# Patient Record
Sex: Female | Born: 1985 | Race: White | Hispanic: No | Smoking: Former smoker
Health system: Southern US, Community
[De-identification: ages and names within clinical notes are randomized; demographics above are authoritative.]

## PROBLEM LIST (undated history)

## (undated) ENCOUNTER — Inpatient Hospital Stay: Payer: Self-pay

## (undated) DIAGNOSIS — R519 Headache, unspecified: Secondary | ICD-10-CM

## (undated) DIAGNOSIS — K8020 Calculus of gallbladder without cholecystitis without obstruction: Secondary | ICD-10-CM

## (undated) DIAGNOSIS — Z3490 Encounter for supervision of normal pregnancy, unspecified, unspecified trimester: Secondary | ICD-10-CM

## (undated) DIAGNOSIS — O09299 Supervision of pregnancy with other poor reproductive or obstetric history, unspecified trimester: Secondary | ICD-10-CM

## (undated) DIAGNOSIS — F329 Major depressive disorder, single episode, unspecified: Secondary | ICD-10-CM

## (undated) DIAGNOSIS — T8859XA Other complications of anesthesia, initial encounter: Secondary | ICD-10-CM

## (undated) DIAGNOSIS — Z9889 Other specified postprocedural states: Secondary | ICD-10-CM

## (undated) DIAGNOSIS — R51 Headache: Secondary | ICD-10-CM

## (undated) DIAGNOSIS — R32 Unspecified urinary incontinence: Secondary | ICD-10-CM

## (undated) DIAGNOSIS — T7840XA Allergy, unspecified, initial encounter: Secondary | ICD-10-CM

## (undated) DIAGNOSIS — F419 Anxiety disorder, unspecified: Secondary | ICD-10-CM

## (undated) DIAGNOSIS — J45909 Unspecified asthma, uncomplicated: Secondary | ICD-10-CM

## (undated) DIAGNOSIS — O99810 Abnormal glucose complicating pregnancy: Secondary | ICD-10-CM

## (undated) DIAGNOSIS — F32A Depression, unspecified: Secondary | ICD-10-CM

## (undated) DIAGNOSIS — O3663X0 Maternal care for excessive fetal growth, third trimester, not applicable or unspecified: Secondary | ICD-10-CM

## (undated) DIAGNOSIS — R112 Nausea with vomiting, unspecified: Secondary | ICD-10-CM

## (undated) DIAGNOSIS — K219 Gastro-esophageal reflux disease without esophagitis: Secondary | ICD-10-CM

## (undated) DIAGNOSIS — Z9289 Personal history of other medical treatment: Secondary | ICD-10-CM

## (undated) DIAGNOSIS — O24419 Gestational diabetes mellitus in pregnancy, unspecified control: Secondary | ICD-10-CM

## (undated) DIAGNOSIS — D649 Anemia, unspecified: Secondary | ICD-10-CM

## (undated) DIAGNOSIS — T4145XA Adverse effect of unspecified anesthetic, initial encounter: Secondary | ICD-10-CM

## (undated) HISTORY — DX: Personal history of other medical treatment: Z92.89

## (undated) HISTORY — DX: Headache, unspecified: R51.9

## (undated) HISTORY — PX: MOUTH SURGERY: SHX715

## (undated) HISTORY — PX: FOREIGN BODY REMOVAL: SHX962

## (undated) HISTORY — DX: Unspecified urinary incontinence: R32

## (undated) HISTORY — DX: Gestational diabetes mellitus in pregnancy, unspecified control: O24.419

## (undated) HISTORY — PX: TUBAL LIGATION: SHX77

## (undated) HISTORY — DX: Allergy, unspecified, initial encounter: T78.40XA

## (undated) HISTORY — DX: Abnormal glucose complicating pregnancy: O99.810

## (undated) HISTORY — DX: Maternal care for excessive fetal growth, third trimester, not applicable or unspecified: O36.63X0

## (undated) HISTORY — DX: Encounter for supervision of normal pregnancy, unspecified, unspecified trimester: Z34.90

## (undated) HISTORY — DX: Anxiety disorder, unspecified: F41.9

## (undated) HISTORY — DX: Headache: R51

---

## 2009-05-27 ENCOUNTER — Observation Stay: Payer: Self-pay | Admitting: Obstetrics & Gynecology

## 2009-11-30 ENCOUNTER — Observation Stay: Payer: Self-pay | Admitting: Unknown Physician Specialty

## 2009-12-29 ENCOUNTER — Observation Stay: Payer: Self-pay

## 2009-12-30 ENCOUNTER — Observation Stay: Payer: Self-pay

## 2009-12-31 ENCOUNTER — Observation Stay: Payer: Self-pay | Admitting: Obstetrics and Gynecology

## 2010-01-01 ENCOUNTER — Inpatient Hospital Stay: Payer: Self-pay | Admitting: Obstetrics & Gynecology

## 2010-02-26 ENCOUNTER — Emergency Department: Payer: Self-pay | Admitting: Emergency Medicine

## 2011-09-16 ENCOUNTER — Ambulatory Visit: Payer: Self-pay | Admitting: Family Medicine

## 2011-11-22 ENCOUNTER — Emergency Department: Payer: Self-pay | Admitting: Internal Medicine

## 2011-11-22 LAB — LIPASE, BLOOD: Lipase: 361 U/L (ref 73–393)

## 2011-11-22 LAB — COMPREHENSIVE METABOLIC PANEL
Alkaline Phosphatase: 39 U/L — ABNORMAL LOW (ref 50–136)
Anion Gap: 12 (ref 7–16)
BUN: 8 mg/dL (ref 7–18)
Bilirubin,Total: 0.3 mg/dL (ref 0.2–1.0)
Chloride: 105 mmol/L (ref 98–107)
Co2: 23 mmol/L (ref 21–32)
Creatinine: 0.52 mg/dL — ABNORMAL LOW (ref 0.60–1.30)
EGFR (African American): >60
EGFR (Non-African Amer.): >60
Osmolality: 277 (ref 275–301)
Potassium: 3.8 mmol/L (ref 3.5–5.1)
SGPT (ALT): 19 U/L
Sodium: 140 mmol/L (ref 136–145)
Total Protein: 7.3 g/dL (ref 6.4–8.2)

## 2011-11-22 LAB — URINALYSIS, COMPLETE
Bilirubin,UR: NEGATIVE
Glucose,UR: NEGATIVE mg/dL (ref 0–75)
Leukocyte Esterase: NEGATIVE
Nitrite: NEGATIVE
Protein: NEGATIVE
RBC,UR: 1 /HPF (ref 0–5)
Squamous Epithelial: 5

## 2011-11-22 LAB — CBC
HCT: 37.4 % (ref 35.0–47.0)
HGB: 12.7 g/dL (ref 12.0–16.0)
MCH: 29.6 pg (ref 26.0–34.0)
MCHC: 33.9 g/dL (ref 32.0–36.0)

## 2011-11-22 LAB — PREGNANCY, URINE: Pregnancy Test, Urine: POSITIVE m[IU]/mL

## 2011-11-22 LAB — WET PREP, GENITAL

## 2012-03-07 ENCOUNTER — Observation Stay: Payer: Self-pay | Admitting: Obstetrics and Gynecology

## 2012-03-16 ENCOUNTER — Observation Stay: Payer: Self-pay | Admitting: Obstetrics & Gynecology

## 2012-04-18 ENCOUNTER — Observation Stay: Payer: Self-pay | Admitting: Obstetrics and Gynecology

## 2012-04-18 LAB — URINALYSIS, COMPLETE
Bilirubin,UR: NEGATIVE
Blood: NEGATIVE
Glucose,UR: NEGATIVE mg/dL (ref 0–75)
Ketone: NEGATIVE
Nitrite: NEGATIVE
Ph: 7 (ref 4.5–8.0)
Protein: NEGATIVE
RBC,UR: NONE SEEN /HPF (ref 0–5)
Specific Gravity: 1.013 (ref 1.003–1.030)
Squamous Epithelial: 8
WBC UR: 2 /HPF (ref 0–5)

## 2012-04-19 LAB — URINE CULTURE

## 2012-04-23 ENCOUNTER — Emergency Department: Payer: Self-pay

## 2012-04-23 LAB — COMPREHENSIVE METABOLIC PANEL
Alkaline Phosphatase: 174 U/L — ABNORMAL HIGH (ref 50–136)
Anion Gap: 9 (ref 7–16)
BUN: 4 mg/dL — ABNORMAL LOW (ref 7–18)
Calcium, Total: 8.3 mg/dL — ABNORMAL LOW (ref 8.5–10.1)
Chloride: 109 mmol/L — ABNORMAL HIGH (ref 98–107)
Co2: 24 mmol/L (ref 21–32)
EGFR (African American): >60
EGFR (Non-African Amer.): >60
Potassium: 4.1 mmol/L (ref 3.5–5.1)
SGOT(AST): 37 U/L (ref 15–37)
SGPT (ALT): 12 U/L
Total Protein: 6.5 g/dL (ref 6.4–8.2)

## 2012-04-23 LAB — URINALYSIS, COMPLETE
Bacteria: NONE SEEN
Glucose,UR: NEGATIVE mg/dL (ref 0–75)
Leukocyte Esterase: NEGATIVE
Nitrite: NEGATIVE
Protein: NEGATIVE
RBC,UR: 1 /HPF (ref 0–5)
Specific Gravity: 1.017 (ref 1.003–1.030)
WBC UR: 1 /HPF (ref 0–5)

## 2012-04-23 LAB — CBC: WBC: 13.2 10*3/uL — ABNORMAL HIGH (ref 3.6–11.0)

## 2012-05-05 ENCOUNTER — Observation Stay: Payer: Self-pay

## 2012-05-05 LAB — URINALYSIS, COMPLETE
Bilirubin,UR: NEGATIVE
Blood: NEGATIVE
Glucose,UR: NEGATIVE mg/dL (ref 0–75)
Ketone: NEGATIVE
Ph: 6 (ref 4.5–8.0)
Squamous Epithelial: 35
WBC UR: 9 /HPF (ref 0–5)

## 2012-05-07 LAB — URINE CULTURE

## 2012-05-18 ENCOUNTER — Observation Stay: Payer: Self-pay

## 2012-06-02 ENCOUNTER — Inpatient Hospital Stay: Payer: Self-pay | Admitting: Obstetrics & Gynecology

## 2012-06-02 LAB — CBC WITH DIFFERENTIAL/PLATELET
Eosinophil %: 0.3 %
HCT: 31.4 % — ABNORMAL LOW (ref 35.0–47.0)
Lymphocyte #: 2.6 10*3/uL (ref 1.0–3.6)
MCV: 80 fL (ref 80–100)
Monocyte %: 5.5 %
Neutrophil #: 7.1 10*3/uL — ABNORMAL HIGH (ref 1.4–6.5)
RBC: 3.91 10*6/uL (ref 3.80–5.20)
WBC: 10.4 10*3/uL (ref 3.6–11.0)

## 2013-09-04 IMAGING — US ABDOMEN ULTRASOUND LIMITED
1 series · 17 of 25 positions shown · non-contrast
Comparison: none

REASON FOR EXAM: elevated liver enzymes  eval liver gallbladder
COMMENTS:

[Series 1: abdomen ultrasound limited · 17 of 44 slices shown]
[im 1/44]
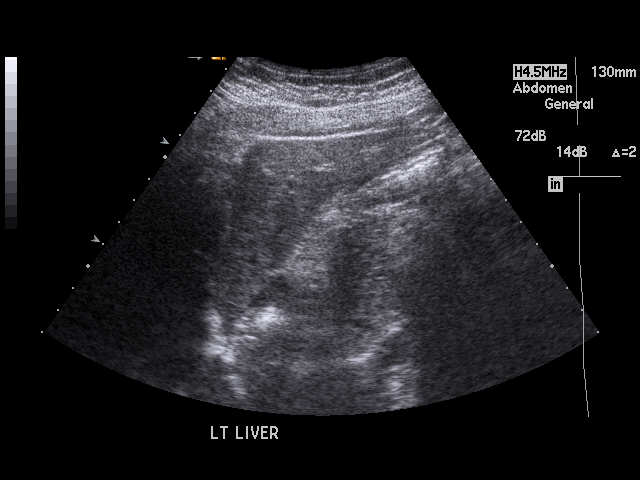
[im 4/44]
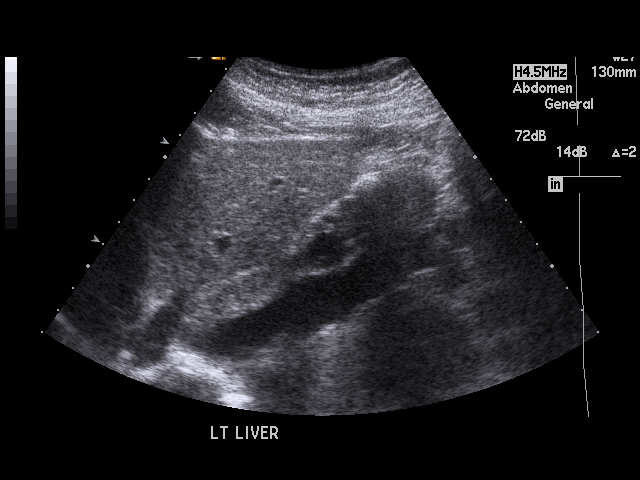
[im 6/44]
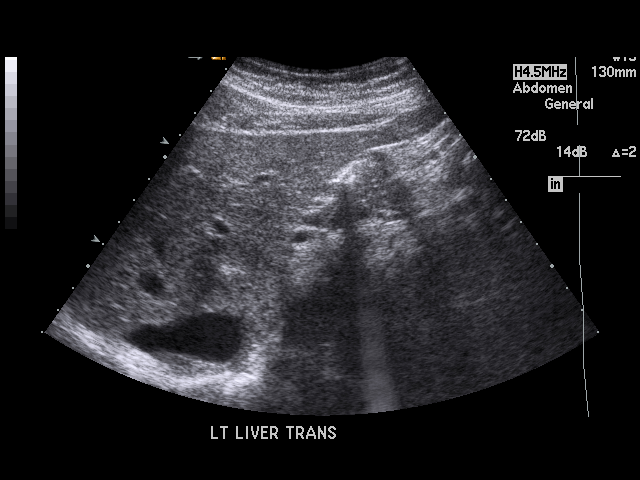
[im 9/44]
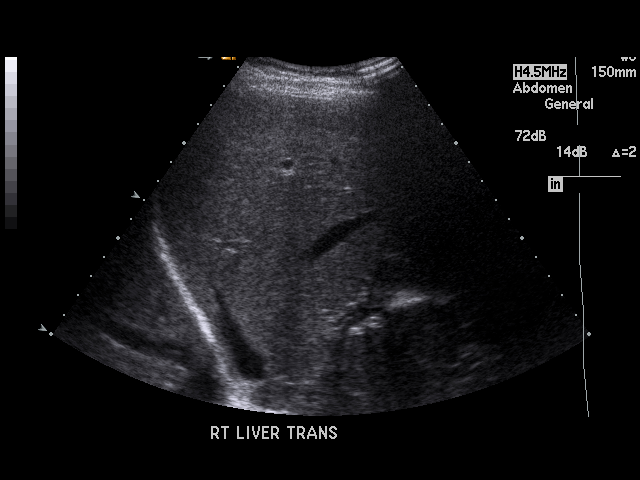
[im 11/44]
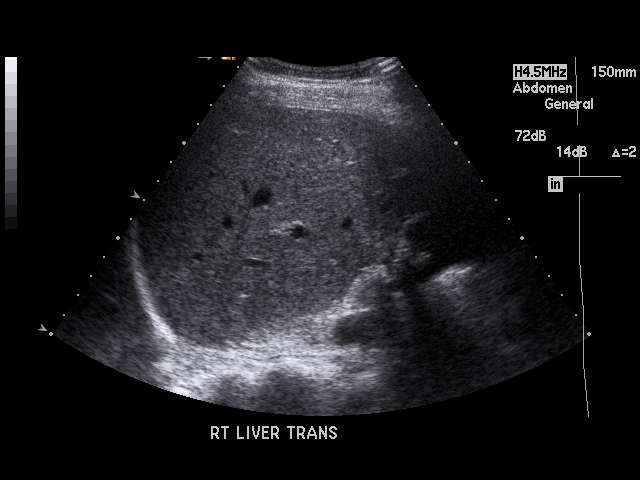
[im 15/44]
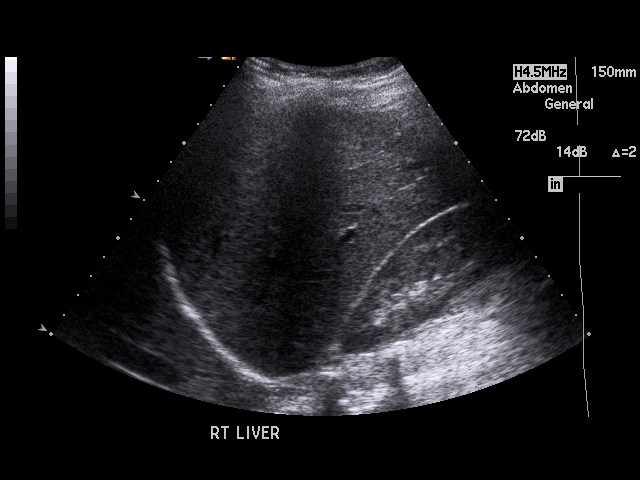
[im 17/44]
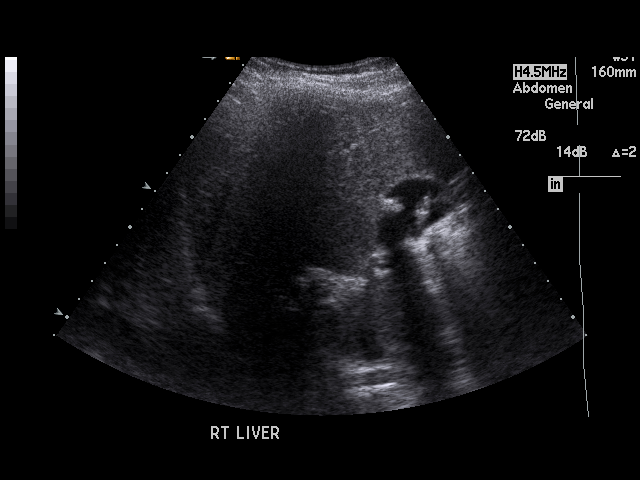
[im 20/44]
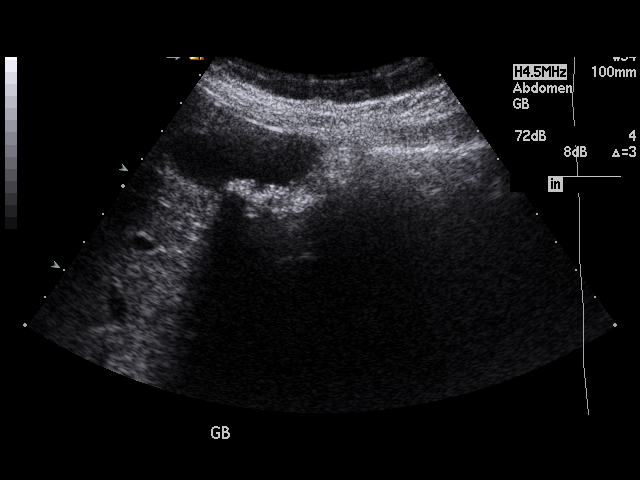
[im 22/44]
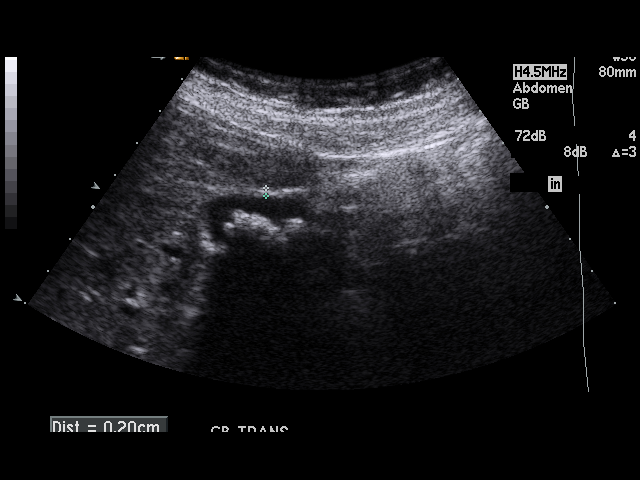
[im 24/44]
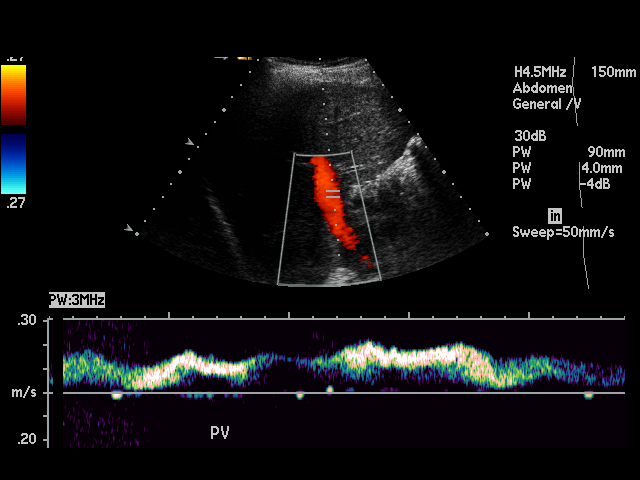
[im 27/44]
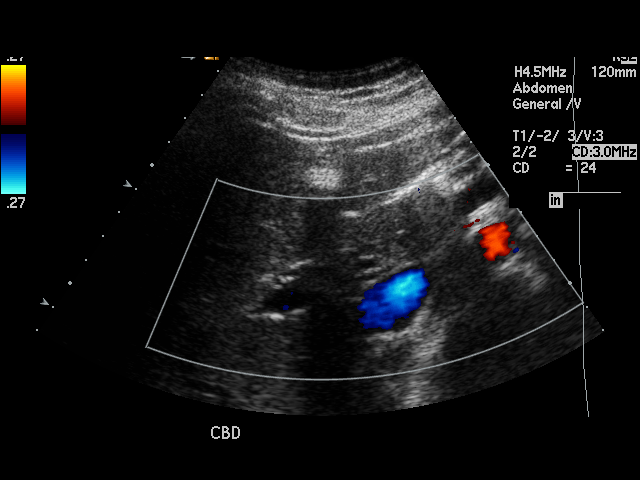
[im 29/44]
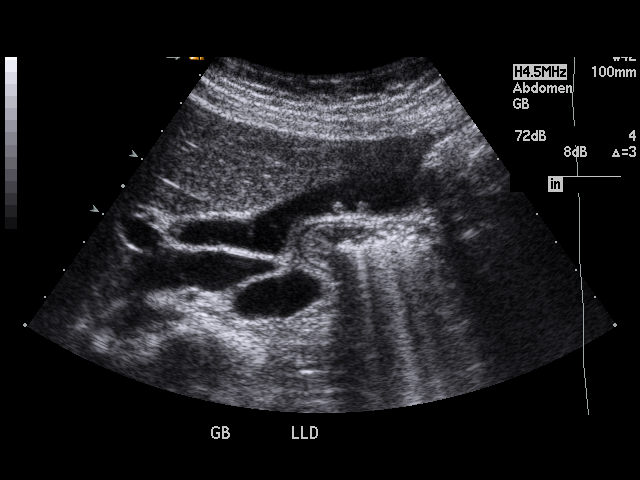
[im 33/44]
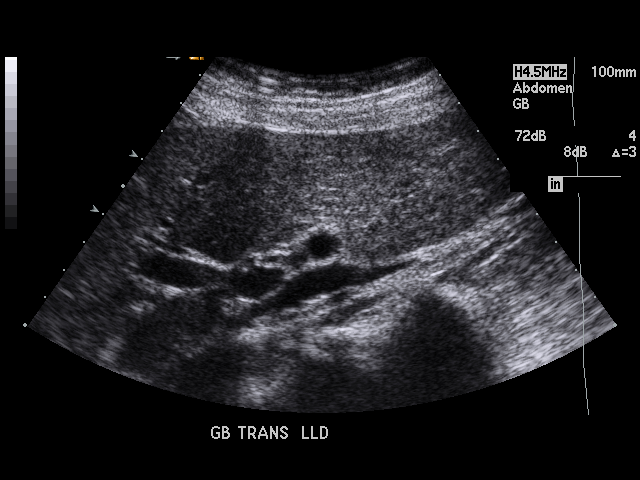
[im 35/44]
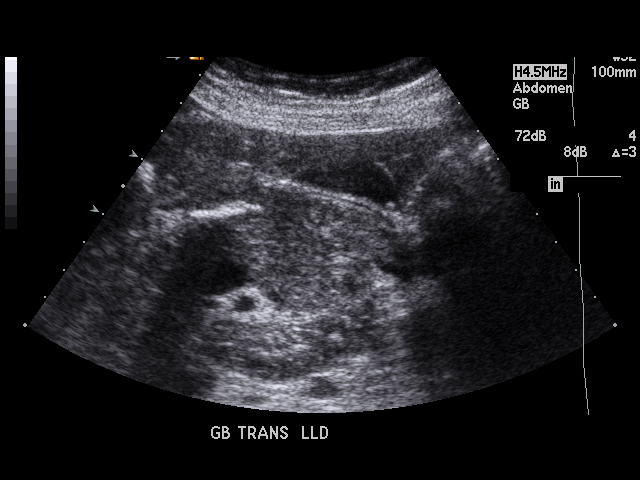
[im 38/44]
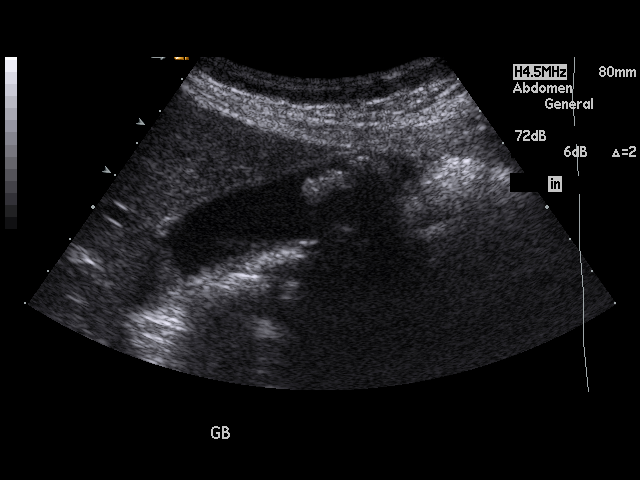
[im 40/44]
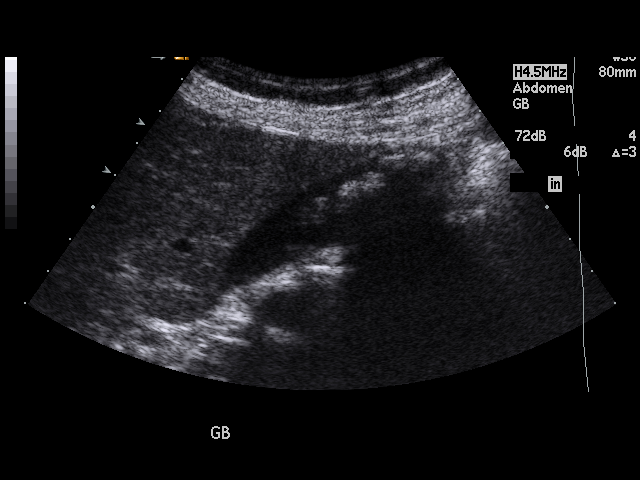
[im 44/44]
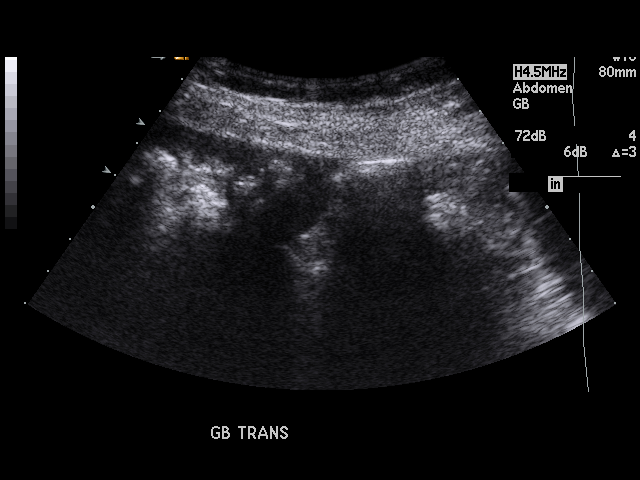

[17 of 25 positions shown; findings below may reference images not displayed]

PROCEDURE:     US  - US ABDOMEN LIMITED SURVEY  - September 16, 2011  [DATE]

RESULT:

Limited Abdominal Ultrasound examination was performed for evaluation of the
liver and biliary tract. The hepatic echo pattern is normal in appearance.
No dilated intrahepatic ducts are seen. No hepatic mass is noted. There are
multiple, shadowing echodensities in the gallbladder compatible with mobile
gallstones. There is no thickening of the gallbladder wall which measures 2
mm in thickness. The common bile duct measures 3.1 mm in diameter which is
within normal limits.
IMPRESSION: Cholelithiasis.

## 2014-08-02 LAB — HM PAP SMEAR: HM Pap smear: NEGATIVE

## 2014-08-03 LAB — OB RESULTS CONSOLE RUBELLA ANTIBODY, IGM: Rubella: IMMUNE

## 2014-08-03 LAB — OB RESULTS CONSOLE RPR: RPR: NONREACTIVE

## 2014-08-03 LAB — OB RESULTS CONSOLE ABO/RH: RH Type: POSITIVE

## 2014-08-03 LAB — OB RESULTS CONSOLE HEPATITIS B SURFACE ANTIGEN: HEP B S AG: NEGATIVE

## 2014-08-03 LAB — OB RESULTS CONSOLE GC/CHLAMYDIA
Chlamydia: NEGATIVE
GC PROBE AMP, GENITAL: NEGATIVE

## 2014-08-03 LAB — OB RESULTS CONSOLE VARICELLA ZOSTER ANTIBODY, IGG: Varicella: IMMUNE

## 2014-08-03 LAB — OB RESULTS CONSOLE HIV ANTIBODY (ROUTINE TESTING): HIV: NONREACTIVE

## 2014-09-29 NOTE — L&D Delivery Note (Signed)
Delivery Note At 5:08 PM a viable female was delivered via C-Section, Low Transverse (Presentation: Cephalic).  APGAR: 8, 9; weight 11 lb 7.8 oz (5211 g).   Placenta status: Intact, .  Cord: 3 vessels with the following complications: None.  Cord pH: not sent  Anesthesia: Spinal  Episiotomy:  n/a Lacerations:  n/a Suture Repair: n/a Est. Blood Loss (mL):  1,000 mL  Mom to postpartum.  Baby to Couplet care / Skin to Skin.  Will Bonnet, MD, [facility] 03/22/2015 6:23 PM

## 2014-10-08 ENCOUNTER — Emergency Department: Payer: Self-pay | Admitting: Emergency Medicine

## 2014-10-08 LAB — CBC WITH DIFFERENTIAL/PLATELET
Basophil #: 0 10*3/uL (ref 0.0–0.1)
Basophil %: 0.3 %
EOS PCT: 0.7 %
Eosinophil #: 0.1 10*3/uL (ref 0.0–0.7)
HCT: 35.9 % (ref 35.0–47.0)
HGB: 11.8 g/dL — ABNORMAL LOW (ref 12.0–16.0)
LYMPHS ABS: 3.3 10*3/uL (ref 1.0–3.6)
Lymphocyte %: 22.7 %
MCH: 28.8 pg (ref 26.0–34.0)
MCHC: 32.8 g/dL (ref 32.0–36.0)
MCV: 88 fL (ref 80–100)
MONO ABS: 0.7 x10 3/mm (ref 0.2–0.9)
Monocyte %: 5 %
Neutrophil #: 10.4 10*3/uL — ABNORMAL HIGH (ref 1.4–6.5)
Neutrophil %: 71.3 %
PLATELETS: 264 10*3/uL (ref 150–440)
RBC: 4.09 10*6/uL (ref 3.80–5.20)
RDW: 14 % (ref 11.5–14.5)
WBC: 14.6 10*3/uL — AB (ref 3.6–11.0)

## 2014-10-08 LAB — COMPREHENSIVE METABOLIC PANEL
Albumin: 3 g/dL — ABNORMAL LOW (ref 3.4–5.0)
Alkaline Phosphatase: 80 U/L
Anion Gap: 9 (ref 7–16)
BILIRUBIN TOTAL: 0.2 mg/dL (ref 0.2–1.0)
BUN: 5 mg/dL — AB (ref 7–18)
CHLORIDE: 106 mmol/L (ref 98–107)
CO2: 23 mmol/L (ref 21–32)
CREATININE: 0.42 mg/dL — AB (ref 0.60–1.30)
Calcium, Total: 8.6 mg/dL (ref 8.5–10.1)
EGFR (Non-African Amer.): >60
Glucose: 70 mg/dL (ref 65–99)
Osmolality: 271 (ref 275–301)
POTASSIUM: 3.6 mmol/L (ref 3.5–5.1)
SGOT(AST): 51 U/L — ABNORMAL HIGH (ref 15–37)
SGPT (ALT): 37 U/L
Sodium: 138 mmol/L (ref 136–145)
Total Protein: 7.4 g/dL (ref 6.4–8.2)

## 2014-10-08 LAB — URINALYSIS, COMPLETE
Bacteria: NONE SEEN
Bilirubin,UR: NEGATIVE
Blood: NEGATIVE
GLUCOSE, UR: NEGATIVE mg/dL (ref 0–75)
KETONE: NEGATIVE
LEUKOCYTE ESTERASE: NEGATIVE
Nitrite: NEGATIVE
PH: 8 (ref 4.5–8.0)
Protein: NEGATIVE
RBC,UR: <1 /HPF (ref 0–5)
Specific Gravity: 1.004 (ref 1.003–1.030)
Squamous Epithelial: <1
WBC UR: <1 /HPF (ref 0–5)

## 2014-10-08 LAB — HCG, QUANTITATIVE, PREGNANCY: Beta Hcg, Quant.: 35104 m[IU]/mL — ABNORMAL HIGH

## 2014-12-18 ENCOUNTER — Observation Stay: Payer: Self-pay

## 2015-01-04 ENCOUNTER — Observation Stay
Admit: 2015-01-04 | Disposition: A | Discharge status: Home / Self Care | Payer: Self-pay | Attending: Obstetrics & Gynecology | Admitting: Obstetrics & Gynecology

## 2015-02-06 NOTE — H&P (Signed)
L&D Evaluation:  History Expanded:   HPI 29 yo at 28 weeks w fall in shower at 1015, fell forward onto shower bar hitting abdomen.  Denies pain, bleeding, contractions. Good FM.    Patient's Medical History No Chronic Illness    Patient's Surgical History none    Medications Pre Natal Vitamins    Allergies NKDA    Social History tobacco    Family History Non-Contributory   ROS:   ROS All systems were reviewed.  HEENT, CNS, GI, GU, Respiratory, CV, Renal and Musculoskeletal systems were found to be normal.   Exam:   Vital Signs stable    General no apparent distress    Mental Status clear    Abdomen gravid, non-tender    Estimated Fetal Weight Average for gestational age    Back no CVAT    Edema no edema    FHT normal rate with no decels, Non-Stress Test REACTIVE    Fetal Heart Rate 150    Ucx absent    Skin dry   Impression:   Impression Fall/Trauma.   Plan:   Plan Monitor for 6 hours from time of fall.    Comments No signs or symptoms of abruption or Preterm Labor.   Stable for discharge.  Outpt follow-up.   Electronic Signatures: Hoyt Koch (MD)  (Signed 18-Jun-13 16:35)  Authored: L&D Evaluation   Last Updated: 18-Jun-13 16:35 by Hoyt Koch (MD)

## 2015-02-06 NOTE — H&P (Signed)
L&D Evaluation:  History:   HPI 29 yo G2P1001 with EDC=06/03/2012 by LMP=08/28/2011 presents at [redacted]w[redacted]d gestational age, with complaints of contractions since yesterday that were every 20 min, and today became every 10 min then every 4-7 min just before arrival to hospital. She c/o cramping in lower abd and back ache. Seen yesterday in the office for a SROM check-and was told she was 1-2 cm dilated.   She denies vaginal bleeding and leakage of fluid today. She denies vaginal irritation and itching. Prenatal care begun in first trimester and has been remarkable for nausea/vomiting (and hx of gallstones)treated with phenergan and a low fat diet and round ligament discomfort. Hx of mild depression-and discontinued Lexapro with pregnancy. Prior history of macrosomic infant-delivering a 9#11 oz baby at 40 weeks in 2011.    Presents with contractions    Patient's Medical History Depression, Cholelithiasis    Patient's Surgical History none    Medications Pre Natal Vitamins  Iron    Allergies NKDA, other, red food coloring    Social History tobacco  former smoker    Family History Non-Contributory   ROS:   ROS see HPI   Exam:   Vital Signs stable  122/75    General no apparent distress    Mental Status clear    Abdomen gravid, non-tender    Pelvic mild erythema of vestibule. wet prep positive for a few hyphae, neg clue cells or Trichimonas. cx exam per RN:: 1-2 external os, closed int os/long    Mebranes Intact    FHT normal rate with no decels, 140s baseline with accels to 170s    FHT Description mod variability    Ucx irregular, 2-15 min apart, many lasting 30 sec   Impression:   Impression IUP at 35 6/7 weeks with monilia vulvovaginitis. Not in labor. Reactive NST.   Plan:   Plan discharge, home. Diflucan 150 mgm today then repeat in 3-4 days. FU i8/9 as scheduled.   Electronic Signatures: Karene Fry (CNM)  (Signed 07-Aug-13 21:01)  Authored: L&D  Evaluation   Last Updated: 07-Aug-13 21:01 by Karene Fry (CNM)

## 2015-02-06 NOTE — H&P (Signed)
L&D Evaluation:  History:   HPI 29 yo G2P1001 at [redacted]w[redacted]d gestational age, who's pregnancy has been complicated by history of a macrosomic infant, presents with decreased fetal movement and pelvic pressure.  She denies vaginal bleeding and leakage of fluid. She notes increase in urine frequency. She denies vaginal irritation and itching.    Patient's Medical History No Chronic Illness    Patient's Surgical History none    Medications Pre Natal Vitamins  Iron    Allergies NKDA    Social History tobacco    Family History Non-Contributory   ROS:   ROS All systems were reviewed.  HEENT, CNS, GI, GU, Respiratory, CV, Renal and Musculoskeletal systems were found to be normal.   Exam:   Vital Signs stable    Urine Protein negative dipstick    General no apparent distress    Mental Status clear    Chest clear    Heart normal sinus rhythm    Abdomen gravid, non-tender    Estimated Fetal Weight Average for gestational age    Back no CVAT    Edema no edema    Pelvic cervix closed and thick    Mebranes Intact    FHT normal rate with no decels    FHT Description 135/mod var/+accels/no decels    Ucx absent    Skin dry, well healed laceration marks on bilateral forearms    Other Wet Mount: negative for clue cells, trich, and fungal elements UA: LE=trace, bact=1+, epithelial cells=8/hpf   Impression:   Impression UTI, decreased fetal movement, reactive NST   Plan:   Plan UA, EFM/NST, discharge    Comments Symptoms appear to be related to UTI. Will send urine for culture treat UTI with macrobid 100mg  po bid x 7 days NST reactive and she notes fetal movement once arriving at hospital and being on monitor.  fFN collected, but not sent due to closed cervix and no contractions recorded on monitor. Follow-up as previously scheduled. Labor precautions given, pyelonephritis precautions given    Follow Up Appointment already scheduled   Electronic Signatures: Will Bonnet (MD)  (Signed 21-Jul-13 13:42)  Authored: L&D Evaluation   Last Updated: 21-Jul-13 13:42 by Will Bonnet (MD)

## 2015-02-06 NOTE — H&P (Signed)
L&D Evaluation:  History:   HPI 29 yo G2P1001 at [redacted]w[redacted]d presenting with decreased fetal movement.  Patient caled around 11:30 to physician on call stating she had not felt any fetal movement over the past 6hrs.  Prior to this had not good fetal movement in preceeding wek.  She denies contractions, vaginal bleeding, or LOF.  PNC at [facility] thus far has been unremarkable.  She has a history of prior macrosomic fetus delivered 2 years ago weighing 9lbs 11oz. Choroid plexus cyst was note on early anatomy scan but resolved completely on 4 week follow up  Her early glucola conducted 12/08/11 was wnl.  ONL A pos, ABSC neg, RI, VZI, HIV neg, HBsAg neg, and RPR NR.    Presents with decreased fetal movement    Patient's Medical History cholelithiasis, mild depression    Patient's Surgical History none    Medications Pre Natal Vitamins    Allergies red food coloring    Family History Non-Contributory   ROS:   ROS All systems were reviewed.  HEENT, CNS, GI, GU, Respiratory, CV, Renal and Musculoskeletal systems were found to be normal.   Exam:   Vital Signs stable    General no apparent distress    Abdomen gravid, non-tender    Estimated Fetal Weight Average for gestational age    Fetal Position breech on [place]    Other Bedside US reveals good fetal movement, single viable IUP in breech presentation, subjectively normal fluid   Plan:   Plan EFM/NST    Comments - Obtain 20 minutes of reactive fetal tracing - Patient reassured by seeing fetal movement now actually feeling movement.   Electronic Signatures: Dorthula Nettles (MD)  (Signed 09-Jun-13 13:05)  Authored: L&D Evaluation   Last Updated: 09-Jun-13 13:05 by Dorthula Nettles (MD)

## 2015-02-06 NOTE — H&P (Signed)
L&D Evaluation:  History Expanded:  HPI 29 yo G3P2 at 28 weeks w DFM.  No contractions, pain, bleeding, trauma, infection.  Prenatal Care at [facility].   Gravida 3   Term 2   Presents with decreased fetal movement   Patient's Medical History No Chronic Illness   Patient's Surgical History none   Medications Pre Natal Vitamins   Allergies NKDA   Social History none   Family History Non-Contributory   ROS:  ROS All systems were reviewed.  HEENT, CNS, GI, GU, Respiratory, CV, Renal and Musculoskeletal systems were found to be normal.   Exam:  Vital Signs stable   General no apparent distress   Mental Status clear   Abdomen gravid, non-tender   Estimated Fetal Weight Average for gestational age   Edema no edema   FHT normal rate with no decels   Plan:  Comments A NST procedure was performed with FHR monitoring and a normal baseline established, appropriate time of 20-40 minutes of evaluation, and accels >2 seen w 10x10 characteristics (as patient is 28 weeks).  Results show a REACTIVE NST.   Electronic Signatures: Hoyt Koch (MD)  (Signed 07-Apr-16 10:56)  Authored: L&D Evaluation   Last Updated: 07-Apr-16 10:56 by Hoyt Koch (MD)

## 2015-03-05 LAB — OB RESULTS CONSOLE GBS: STREP GROUP B AG: POSITIVE

## 2015-03-14 ENCOUNTER — Observation Stay
Admission: EM | Admit: 2015-03-14 | Discharge: 2015-03-15 | Disposition: A | Discharge status: Home / Self Care | Payer: Managed Care, Other (non HMO) | Attending: Advanced Practice Midwife | Admitting: Advanced Practice Midwife

## 2015-03-14 DIAGNOSIS — Z3A38 38 weeks gestation of pregnancy: Secondary | ICD-10-CM | POA: Insufficient documentation

## 2015-03-14 DIAGNOSIS — O4290 Premature rupture of membranes, unspecified as to length of time between rupture and onset of labor, unspecified weeks of gestation: Secondary | ICD-10-CM | POA: Diagnosis present

## 2015-03-14 NOTE — OB Triage Note (Signed)
Sonya Spencer, CNM to room. Sterile spec exam, slide for ferning and Nitrazine paper. SVE 1cm, 50%, fetal station high. EFM applied. Deanna Artis, RN

## 2015-03-15 DIAGNOSIS — Z3A38 38 weeks gestation of pregnancy: Secondary | ICD-10-CM | POA: Diagnosis not present

## 2015-03-15 DIAGNOSIS — O4290 Premature rupture of membranes, unspecified as to length of time between rupture and onset of labor, unspecified weeks of gestation: Secondary | ICD-10-CM | POA: Diagnosis present

## 2015-03-15 MED ORDER — ACETAMINOPHEN 325 MG PO TABS: 650.0000 mg | ORAL_TABLET | ORAL | Status: DC | PRN

## 2015-03-15 MED ORDER — CALCIUM CARBONATE ANTACID 500 MG PO CHEW: 2.0000 | CHEWABLE_TABLET | ORAL | Status: DC | PRN

## 2015-03-15 NOTE — OB Triage Provider Note (Signed)
Obstetric History and Physical  Sonya Spencer is a 29 y.o. [phone] with Estimated Date of Delivery: 03/28/15 per 6 week [place] who presents at [redacted]w[redacted]d  presenting for leaking fluid at 2100 today, pt questions if she may have been incontinent vs SROM. Patient states she has been having regular contractions, no vaginal bleeding, with active fetal movement.    Prenatal Course Source of Care: [facility]  with onset of care at 5 weeks Pregnancy complications or risks: Notable for macrosomia with EFW >90% (4300gm at 36 weeks)  Patient Active Problem List   Diagnosis Date Noted  . Amniotic fluid leaking 03/15/2015      Prenatal Transfer Tool   Past Medical History  Diagnosis Date  . Medical history non-contributory     History reviewed. No pertinent past surgical history.  OB History  Gravida Para Term Preterm AB SAB TAB Ectopic Multiple Living  3 2 2  0 0 0 0 0 0 2    # Outcome Date GA Lbr Len/2nd Weight Sex Delivery Anes PTL Lv  3 Current           2 Term 06/02/12 [redacted]w[redacted]d  8 lb 14 oz (4.026 kg) F Vag-Spont EPI  Y  1 Term 01/02/10 [redacted]w[redacted]d  9 lb 11 oz (4.394 kg) F Vag-Vacuum EPI Y Y      History   Social History  . Marital Status: Married    Spouse Name: N/A  . Number of Children: N/A  . Years of Education: N/A   Social History Main Topics  . Smoking status: Former Smoker -- 0.25 packs/day for 2 years    Types: Cigarettes    Quit date: 03/13/2009  . Smokeless tobacco: Never Used  . Alcohol Use: No  . Drug Use: No  . Sexual Activity: Yes   Other Topics Concern  . None   Social History Narrative  . None    Family History  Problem Relation Age of Onset  . Lupus Mother   . Diabetes type I Father   . Lupus Sister     No prescriptions prior to admission    Allergies  Allergen Reactions  . Nickel Rash  . Red Dye Rash    Includes Benadryl as it has red dye in it.    Review of Systems: Negative except for what is mentioned in HPI.  Physical Exam: There were no  vitals taken for this visit. GENERAL: Well-developed, well-nourished female in no acute distress.  ABDOMEN: Soft, nontender, nondistended, gravid. EXTREMITIES: Nontender, no edema Cervical Exam: Dilatation 1 cm   Effacement 50%   Station -3   PELVIC: negative fern, nitrizine and pooling Presentation: cephalic FHT: Category:1 Baseline rate 150 bpm   Variability moderate  Accelerations present   Decelerations none Contractions: Every 3-5 mins   Pertinent Labs/Studies:   No results found for this or any previous visit (from the past 24 hour(s)).  Assessment : IUP at [redacted]w[redacted]d, intact membranes, contractions  Plan: Observe for cervical change  May ambulate in hallways, intermittent monitoring

## 2015-03-15 NOTE — Progress Notes (Signed)
S: Pt still noting contractions. Denies any worsening in pain.  O: Cervix 1-1.5/50/-3, minimal change from prior exam. FHR category 1, toco ctx q 2-4 min A: IUP at [redacted]w[redacted]d, prodromal vs early labor P: Discussed options to continue expectant management IP vs d/c home with instructions to return home once in active labor. Pt opts for d/c home. Labor precautions.  F/u at [place] at office on 6/20 if not delivered

## 2015-03-21 ENCOUNTER — Inpatient Hospital Stay: Admission: RE | Admit: 2015-03-21 | Payer: Self-pay | Source: Ambulatory Visit

## 2015-03-22 ENCOUNTER — Inpatient Hospital Stay
Admission: RE | Admit: 2015-03-22 | Payer: Medicaid Other | Source: Ambulatory Visit | Admitting: Obstetrics and Gynecology

## 2015-03-22 ENCOUNTER — Inpatient Hospital Stay
Admission: EM | Admit: 2015-03-22 | Discharge: 2015-03-25 | DRG: 765 | Disposition: A | Discharge status: Home / Self Care | Payer: Managed Care, Other (non HMO) | Attending: Obstetrics and Gynecology | Admitting: Obstetrics and Gynecology

## 2015-03-22 ENCOUNTER — Encounter: Admission: RE | Payer: Self-pay | Source: Ambulatory Visit

## 2015-03-22 ENCOUNTER — Inpatient Hospital Stay: Payer: Managed Care, Other (non HMO) | Admitting: Anesthesiology

## 2015-03-22 ENCOUNTER — Encounter
Admission: EM | Disposition: A | Discharge status: Home / Self Care | Payer: Self-pay | Source: Home / Self Care | Attending: Obstetrics and Gynecology

## 2015-03-22 ENCOUNTER — Encounter: Payer: Self-pay | Admitting: *Deleted

## 2015-03-22 DIAGNOSIS — O9962 Diseases of the digestive system complicating childbirth: Secondary | ICD-10-CM | POA: Diagnosis present

## 2015-03-22 DIAGNOSIS — O3663X0 Maternal care for excessive fetal growth, third trimester, not applicable or unspecified: Secondary | ICD-10-CM | POA: Diagnosis present

## 2015-03-22 DIAGNOSIS — D62 Acute posthemorrhagic anemia: Secondary | ICD-10-CM | POA: Diagnosis not present

## 2015-03-22 DIAGNOSIS — O99824 Streptococcus B carrier state complicating childbirth: Secondary | ICD-10-CM | POA: Diagnosis present

## 2015-03-22 DIAGNOSIS — O9902 Anemia complicating childbirth: Secondary | ICD-10-CM | POA: Diagnosis present

## 2015-03-22 DIAGNOSIS — K219 Gastro-esophageal reflux disease without esophagitis: Secondary | ICD-10-CM | POA: Diagnosis present

## 2015-03-22 DIAGNOSIS — Z833 Family history of diabetes mellitus: Secondary | ICD-10-CM

## 2015-03-22 DIAGNOSIS — Z3A39 39 weeks gestation of pregnancy: Secondary | ICD-10-CM | POA: Diagnosis present

## 2015-03-22 DIAGNOSIS — Z87891 Personal history of nicotine dependence: Secondary | ICD-10-CM

## 2015-03-22 DIAGNOSIS — IMO0002 Reserved for concepts with insufficient information to code with codable children: Secondary | ICD-10-CM

## 2015-03-22 HISTORY — DX: Calculus of gallbladder without cholecystitis without obstruction: K80.20

## 2015-03-22 HISTORY — DX: Depression, unspecified: F32.A

## 2015-03-22 HISTORY — DX: Supervision of pregnancy with other poor reproductive or obstetric history, unspecified trimester: O09.299

## 2015-03-22 HISTORY — DX: Major depressive disorder, single episode, unspecified: F32.9

## 2015-03-22 HISTORY — DX: Anemia, unspecified: D64.9

## 2015-03-22 LAB — CBC
HCT: 30.1 % — ABNORMAL LOW (ref 35.0–47.0)
HEMOGLOBIN: 9.4 g/dL — AB (ref 12.0–16.0)
MCH: 23.4 pg — ABNORMAL LOW (ref 26.0–34.0)
MCHC: 31.4 g/dL — ABNORMAL LOW (ref 32.0–36.0)
MCV: 74.7 fL — AB (ref 80.0–100.0)
Platelets: 272 10*3/uL (ref 150–440)
RBC: 4.03 MIL/uL (ref 3.80–5.20)
RDW: 17.9 % — ABNORMAL HIGH (ref 11.5–14.5)
WBC: 11.4 10*3/uL — AB (ref 3.6–11.0)

## 2015-03-22 LAB — TYPE AND SCREEN
ABO/RH(D): A POS
Antibody Screen: NEGATIVE

## 2015-03-22 LAB — ABO/RH: ABO/RH(D): A POS

## 2015-03-22 SURGERY — Surgical Case
Anesthesia: Choice

## 2015-03-22 SURGERY — Surgical Case
Anesthesia: Spinal | Wound class: Clean Contaminated

## 2015-03-22 MED ORDER — SODIUM CHLORIDE 0.9 % IV SOLN
2.0000 g | Freq: Once | INTRAVENOUS | Status: AC
  Administered 2015-03-22: 2 g via INTRAVENOUS

## 2015-03-22 MED ORDER — SODIUM CHLORIDE 0.9 % IJ SOLN: 3.0000 mL | INTRAMUSCULAR | Status: DC | PRN

## 2015-03-22 MED ORDER — NALBUPHINE HCL 10 MG/ML IJ SOLN
5.0000 mg | INTRAMUSCULAR | Status: DC | PRN
  Filled 2015-03-22: qty 0.5

## 2015-03-22 MED ORDER — FERROUS SULFATE 325 (65 FE) MG PO TABS
325.0000 mg | ORAL_TABLET | Freq: Two times a day (BID) | ORAL | Status: DC
  Administered 2015-03-23 – 2015-03-24 (×2): 325 mg via ORAL
  Filled 2015-03-22 (×5): qty 1

## 2015-03-22 MED ORDER — WITCH HAZEL-GLYCERIN EX PADS: 1.0000 "application " | MEDICATED_PAD | CUTANEOUS | Status: DC | PRN

## 2015-03-22 MED ORDER — ONDANSETRON HCL 4 MG/2ML IJ SOLN: 4.0000 mg | INTRAMUSCULAR | Status: DC | PRN

## 2015-03-22 MED ORDER — BUPIVACAINE HCL 0.5 % IJ SOLN: 5.0000 mL | Freq: Once | INTRAMUSCULAR | Status: DC

## 2015-03-22 MED ORDER — OXYTOCIN 40 UNITS IN LACTATED RINGERS INFUSION - SIMPLE MED
62.5000 mL/h | INTRAVENOUS | Status: DC
  Administered 2015-03-22: 62.5 mL/h via INTRAVENOUS

## 2015-03-22 MED ORDER — TERBUTALINE SULFATE 1 MG/ML IJ SOLN: 0.2500 mg | Freq: Once | INTRAMUSCULAR | Status: DC | PRN

## 2015-03-22 MED ORDER — NALOXONE HCL 0.4 MG/ML IJ SOLN: 0.4000 mg | INTRAMUSCULAR | Status: DC | PRN

## 2015-03-22 MED ORDER — TETANUS-DIPHTH-ACELL PERTUSSIS 5-2.5-18.5 LF-MCG/0.5 IM SUSP
0.5000 mL | INTRAMUSCULAR | Status: AC | PRN
  Administered 2015-03-25: 0.5 mL via INTRAMUSCULAR
  Filled 2015-03-22: qty 0.5

## 2015-03-22 MED ORDER — FENTANYL CITRATE (PF) 100 MCG/2ML IJ SOLN: 25.0000 ug | INTRAMUSCULAR | Status: DC | PRN

## 2015-03-22 MED ORDER — ONDANSETRON HCL 4 MG/2ML IJ SOLN: 4.0000 mg | Freq: Four times a day (QID) | INTRAMUSCULAR | Status: DC | PRN

## 2015-03-22 MED ORDER — NALBUPHINE HCL 10 MG/ML IJ SOLN
5.0000 mg | Freq: Once | INTRAMUSCULAR | Status: AC | PRN
  Filled 2015-03-22: qty 0.5

## 2015-03-22 MED ORDER — NALOXONE HCL 1 MG/ML IJ SOLN: 1.0000 ug/kg/h | INTRAVENOUS | Status: DC | PRN

## 2015-03-22 MED ORDER — LACTATED RINGERS IV SOLN
INTRAVENOUS | Status: DC | PRN
  Administered 2015-03-22: 17:00:00 via INTRAVENOUS

## 2015-03-22 MED ORDER — ACETAMINOPHEN 650 MG RE SUPP: 650.0000 mg | RECTAL | Status: DC | PRN

## 2015-03-22 MED ORDER — LACTATED RINGERS IV SOLN: INTRAVENOUS | Status: DC

## 2015-03-22 MED ORDER — DIBUCAINE 1 % RE OINT
1.0000 | TOPICAL_OINTMENT | RECTAL | Status: DC | PRN
Start: 2015-03-22 — End: 2015-03-25

## 2015-03-22 MED ORDER — CEFAZOLIN SODIUM-DEXTROSE 2-3 GM-% IV SOLR: 2.0000 g | INTRAVENOUS | Status: DC

## 2015-03-22 MED ORDER — PHENOL 1.4 % MT LIQD: 1.0000 | OROMUCOSAL | Status: DC | PRN

## 2015-03-22 MED ORDER — ONDANSETRON HCL 4 MG/2ML IJ SOLN: 4.0000 mg | Freq: Once | INTRAMUSCULAR | Status: AC | PRN

## 2015-03-22 MED ORDER — SCOPOLAMINE 1 MG/3DAYS TD PT72
1.0000 | MEDICATED_PATCH | Freq: Once | TRANSDERMAL | Status: DC
  Filled 2015-03-22: qty 1

## 2015-03-22 MED ORDER — LACTATED RINGERS IV SOLN: 500.0000 mL | INTRAVENOUS | Status: DC | PRN

## 2015-03-22 MED ORDER — CEFAZOLIN SODIUM-DEXTROSE 2-3 GM-% IV SOLR
INTRAVENOUS | Status: AC
  Administered 2015-03-22: 2 g via INTRAVENOUS
  Filled 2015-03-22: qty 50

## 2015-03-22 MED ORDER — CITRIC ACID-SODIUM CITRATE 334-500 MG/5ML PO SOLN
30.0000 mL | Freq: Once | ORAL | Status: AC
  Administered 2015-03-22: 30 mL via ORAL

## 2015-03-22 MED ORDER — SODIUM CHLORIDE 0.9 % IV SOLN: 250.0000 mL | INTRAVENOUS | Status: DC

## 2015-03-22 MED ORDER — SODIUM CHLORIDE 0.9 % IV SOLN: 1.0000 g | INTRAVENOUS | Status: DC

## 2015-03-22 MED ORDER — LIDOCAINE HCL (PF) 1 % IJ SOLN: 30.0000 mL | INTRAMUSCULAR | Status: DC | PRN

## 2015-03-22 MED ORDER — MEPERIDINE HCL 25 MG/ML IJ SOLN: 6.2500 mg | INTRAMUSCULAR | Status: DC | PRN

## 2015-03-22 MED ORDER — OXYTOCIN 40 UNITS IN LACTATED RINGERS INFUSION - SIMPLE MED
INTRAVENOUS | Status: AC
  Filled 2015-03-22: qty 1000

## 2015-03-22 MED ORDER — BUPIVACAINE ON-Q PAIN PUMP (FOR ORDER SET NO CHG)
INJECTION | Status: DC
  Filled 2015-03-22: qty 1

## 2015-03-22 MED ORDER — MENTHOL 3 MG MT LOZG: 1.0000 | LOZENGE | OROMUCOSAL | Status: DC | PRN

## 2015-03-22 MED ORDER — KETOROLAC TROMETHAMINE 30 MG/ML IJ SOLN
30.0000 mg | Freq: Four times a day (QID) | INTRAMUSCULAR | Status: DC | PRN
Start: 2015-03-22 — End: 2015-03-23

## 2015-03-22 MED ORDER — SODIUM CHLORIDE 0.9 % IV SOLN
INTRAVENOUS | Status: AC
  Administered 2015-03-22: 2 g via INTRAVENOUS
  Filled 2015-03-22: qty 2000

## 2015-03-22 MED ORDER — CEFAZOLIN SODIUM-DEXTROSE 2-3 GM-% IV SOLR
2.0000 g | INTRAVENOUS | Status: AC
  Administered 2015-03-22: 2 g via INTRAVENOUS

## 2015-03-22 MED ORDER — BUPIVACAINE 0.25 % ON-Q PUMP DUAL CATH 400 ML: 400.0000 mL | INJECTION | Status: DC

## 2015-03-22 MED ORDER — ONDANSETRON HCL 4 MG/2ML IJ SOLN: 4.0000 mg | Freq: Three times a day (TID) | INTRAMUSCULAR | Status: DC | PRN

## 2015-03-22 MED ORDER — OXYTOCIN BOLUS FROM INFUSION: 500.0000 mL | INTRAVENOUS | Status: DC

## 2015-03-22 MED ORDER — OXYTOCIN 40 UNITS IN LACTATED RINGERS INFUSION - SIMPLE MED
INTRAVENOUS | Status: AC
  Administered 2015-03-22: 1 m[IU]/min via INTRAVENOUS
  Filled 2015-03-22: qty 1000

## 2015-03-22 MED ORDER — LACTATED RINGERS IV SOLN
INTRAVENOUS | Status: DC
  Administered 2015-03-22: 09:00:00 via INTRAVENOUS

## 2015-03-22 MED ORDER — BUPIVACAINE HCL (PF) 0.5 % IJ SOLN: 5.0000 mL | Freq: Once | INTRAMUSCULAR | Status: DC

## 2015-03-22 MED ORDER — OXYTOCIN 40 UNITS IN LACTATED RINGERS INFUSION - SIMPLE MED: 62.5000 mL/h | INTRAVENOUS | Status: AC

## 2015-03-22 MED ORDER — DIPHENHYDRAMINE HCL 50 MG/ML IJ SOLN
INTRAMUSCULAR | Status: AC
  Filled 2015-03-22: qty 1

## 2015-03-22 MED ORDER — MENTHOL 3 MG MT LOZG
1.0000 | LOZENGE | OROMUCOSAL | Status: DC | PRN
  Filled 2015-03-22: qty 9

## 2015-03-22 MED ORDER — PHENYLEPHRINE HCL 10 MG/ML IJ SOLN
INTRAMUSCULAR | Status: DC | PRN
  Administered 2015-03-22 (×3): 100 ug via INTRAVENOUS
  Administered 2015-03-22: 50 ug via INTRAVENOUS

## 2015-03-22 MED ORDER — CITRIC ACID-SODIUM CITRATE 334-500 MG/5ML PO SOLN
ORAL | Status: AC
  Administered 2015-03-22: 30 mL via ORAL
  Filled 2015-03-22: qty 15

## 2015-03-22 MED ORDER — DIPHENHYDRAMINE HCL 50 MG/ML IJ SOLN
12.5000 mg | INTRAMUSCULAR | Status: DC | PRN
  Administered 2015-03-22: 12.5 mg via INTRAVENOUS

## 2015-03-22 MED ORDER — PRENATAL MULTIVITAMIN CH
1.0000 | ORAL_TABLET | Freq: Every day | ORAL | Status: DC
  Administered 2015-03-23 – 2015-03-24 (×2): 1 via ORAL
  Filled 2015-03-22 (×5): qty 1

## 2015-03-22 MED ORDER — ACETAMINOPHEN 325 MG PO TABS: 650.0000 mg | ORAL_TABLET | ORAL | Status: DC | PRN

## 2015-03-22 MED ORDER — BUPIVACAINE HCL (PF) 0.5 % IJ SOLN
INTRAMUSCULAR | Status: AC
  Filled 2015-03-22: qty 30

## 2015-03-22 MED ORDER — DIPHENHYDRAMINE HCL 25 MG PO CAPS: 25.0000 mg | ORAL_CAPSULE | ORAL | Status: DC | PRN

## 2015-03-22 MED ORDER — OXYTOCIN 40 UNITS IN LACTATED RINGERS INFUSION - SIMPLE MED
INTRAVENOUS | Status: DC | PRN
  Administered 2015-03-22: 1000 mL via INTRAVENOUS

## 2015-03-22 MED ORDER — ACETAMINOPHEN 325 MG PO TABS
650.0000 mg | ORAL_TABLET | ORAL | Status: DC | PRN
  Administered 2015-03-23: 650 mg via ORAL
  Filled 2015-03-22: qty 2

## 2015-03-22 MED ORDER — ONDANSETRON 4 MG PO TBDP
ORAL_TABLET | ORAL | Status: AC
  Filled 2015-03-22: qty 1

## 2015-03-22 MED ORDER — OXYTOCIN 40 UNITS IN LACTATED RINGERS INFUSION - SIMPLE MED
1.0000 m[IU]/min | INTRAVENOUS | Status: DC
Start: 2015-03-22 — End: 2015-03-22
  Administered 2015-03-22: 1 m[IU]/min via INTRAVENOUS

## 2015-03-22 MED ORDER — NALOXONE HCL 1 MG/ML IJ SOLN
1.0000 ug/kg/h | INTRAVENOUS | Status: DC | PRN
  Filled 2015-03-22: qty 2

## 2015-03-22 MED ORDER — KETOROLAC TROMETHAMINE 30 MG/ML IJ SOLN
30.0000 mg | Freq: Four times a day (QID) | INTRAMUSCULAR | Status: DC | PRN
  Administered 2015-03-22 – 2015-03-23 (×3): 30 mg via INTRAVENOUS
  Filled 2015-03-22 (×4): qty 1

## 2015-03-22 MED ORDER — BUTORPHANOL TARTRATE 1 MG/ML IJ SOLN: 1.0000 mg | INTRAMUSCULAR | Status: DC | PRN

## 2015-03-22 MED ORDER — SIMETHICONE 80 MG PO CHEW
80.0000 mg | CHEWABLE_TABLET | Freq: Three times a day (TID) | ORAL | Status: DC
  Administered 2015-03-23 – 2015-03-25 (×8): 80 mg via ORAL
  Filled 2015-03-22 (×8): qty 1

## 2015-03-22 MED ORDER — SODIUM CHLORIDE 0.9 % IJ SOLN: 3.0000 mL | Freq: Two times a day (BID) | INTRAMUSCULAR | Status: DC

## 2015-03-22 MED ORDER — LANOLIN HYDROUS EX OINT: 1.0000 "application " | TOPICAL_OINTMENT | CUTANEOUS | Status: DC | PRN

## 2015-03-22 MED ORDER — BUPIVACAINE 0.25 % ON-Q PUMP DUAL CATH 400 ML
INJECTION | Status: AC
  Filled 2015-03-22: qty 400

## 2015-03-22 MED ORDER — BUPIVACAINE HCL 0.5 % IJ SOLN
INTRAMUSCULAR | Status: DC | PRN
  Administered 2015-03-22: 10 mL via INTRA_ARTICULAR

## 2015-03-22 SURGICAL SUPPLY — 28 items
CANISTER SUCT 3000ML (MISCELLANEOUS) ×3 IMPLANT
CATH KIT ON-Q SILVERSOAK 5IN (CATHETERS) ×6 IMPLANT
CLOSURE WOUND 1/2 X4 (GAUZE/BANDAGES/DRESSINGS) ×1
DRSG TELFA 3X8 NADH (GAUZE/BANDAGES/DRESSINGS) ×3 IMPLANT
ELECT CAUTERY BLADE 6.4 (BLADE) ×3 IMPLANT
GAUZE SPONGE 4X4 12PLY STRL (GAUZE/BANDAGES/DRESSINGS) ×3 IMPLANT
GLOVE BIO SURGEON STRL SZ7 (GLOVE) ×3 IMPLANT
GLOVE INDICATOR 7.5 STRL GRN (GLOVE) ×3 IMPLANT
GOWN STRL REUS W/ TWL LRG LVL3 (GOWN DISPOSABLE) ×3 IMPLANT
GOWN STRL REUS W/TWL LRG LVL3 (GOWN DISPOSABLE) ×6
LIQUID BAND (GAUZE/BANDAGES/DRESSINGS) ×3 IMPLANT
NS IRRIG 1000ML POUR BTL (IV SOLUTION) ×3 IMPLANT
PACK C SECTION AR (MISCELLANEOUS) ×3 IMPLANT
PAD GROUND ADULT SPLIT (MISCELLANEOUS) ×3 IMPLANT
PAD OB MATERNITY 4.3X12.25 (PERSONAL CARE ITEMS) ×6 IMPLANT
PAD PREP 24X41 OB/GYN DISP (PERSONAL CARE ITEMS) ×3 IMPLANT
SPONGE LAP 18X18 5 PK (GAUZE/BANDAGES/DRESSINGS) ×9 IMPLANT
STRIP CLOSURE SKIN 1/2X4 (GAUZE/BANDAGES/DRESSINGS) ×2 IMPLANT
SUT CHROMIC GUT BROWN 0 54 (SUTURE) ×1 IMPLANT
SUT CHROMIC GUT BROWN 0 54IN (SUTURE) ×3
SUT MNCRL 4-0 (SUTURE) ×2
SUT MNCRL 4-0 27XMFL (SUTURE) ×1
SUT MNCRL AB 4-0 PS2 18 (SUTURE) ×3 IMPLANT
SUT PDS AB 1 TP1 96 (SUTURE) ×3 IMPLANT
SUT PLAIN 2 0 XLH (SUTURE) ×3 IMPLANT
SUT VIC AB 0 CT1 36 (SUTURE) ×12 IMPLANT
SUTURE MNCRL 4-0 27XMF (SUTURE) ×1 IMPLANT
SWABSTK COMLB BENZOIN TINCTURE (MISCELLANEOUS) ×6 IMPLANT

## 2015-03-22 NOTE — Anesthesia Preprocedure Evaluation (Signed)
Anesthesia Evaluation  Patient identified by MRN, date of birth, ID band Patient awake    Reviewed: Allergy & Precautions, NPO status , Patient's Chart, lab work & pertinent test results  Airway Mallampati: II  TM Distance: >3 FB Neck ROM: Full    Dental  (+) Chipped   Pulmonary former smoker (quit x 6 yrs),          Cardiovascular     Neuro/Psych Depression    GI/Hepatic GERD-  Medicated and Controlled,  Endo/Other    Renal/GU      Musculoskeletal   Abdominal   Peds  Hematology  (+) anemia ,   Anesthesia Other Findings   Reproductive/Obstetrics                             Anesthesia Physical Anesthesia Plan  ASA: II  Anesthesia Plan: Spinal   Post-op Pain Management:    Induction:   Airway Management Planned:   Additional Equipment:   Intra-op Plan:   Post-operative Plan:   Informed Consent: I have reviewed the patients History and Physical, chart, labs and discussed the procedure including the risks, benefits and alternatives for the proposed anesthesia with the patient or authorized representative who has indicated his/her understanding and acceptance.     Plan Discussed with:   Anesthesia Plan Comments:         Anesthesia Quick Evaluation

## 2015-03-22 NOTE — OR Nursing (Signed)
Time of Birth: 17:08 Sex: Female Weight: 11 lbs, 8 oz Apgar:8- 1 min.  9- 5 min

## 2015-03-22 NOTE — Discharge Summary (Signed)
  Obstetrical Discharge Summary  Date of Admission: 03/22/2015 Date of Discharge: 03/25/2015  Primary OB: [facility]  Gestational Age at Delivery: [redacted]w[redacted]d   Antepartum complications: fetal macrosomia Reason for Admission: IOL for fetal macrosomia, patient changed her mind and accepted recommended cesarean delivery Date of Delivery: 03/25/2015   Delivered By: Will Bonnet , MD Delivery Type: primary cesarean section, low transverse incision Intrapartum complications/course: None Anesthesia: spinal Placenta: manual removal Laceration: n/a  Episiotomy: none Baby: Liveborn female, APGARs8/9, weight 5,210 g (11ob 8oz).   Post partum course: The patient was admitted on 03/22/2015 for scheduled induction of labor. Given that there was suspected fetal macrosomia with estimated fetal weight of greater than 5,000 g, it had been recommended to the patient on numerous occasions that she undergo a primary cesarean delivery. Upon admission risks of induction of labor with such a large fetus were reviewed in detail and after great consideration the patient elected to undergo cesarean delivery. Surgery occurred without difficulty. Patient had a normal postpartum and postoperative course. On postoperative day 3 she was meeting criteria for discharge. She was ambulating, tolerating an oral diet, voiding spontaneously, and her pain was well-controlled on oral medications. She received her TDaP vaccination prior to discharge.  Disposition: Home  Rh Immune globulin given: not applicable Rubella vaccine given: not applicable Tdap vaccine given in AP or PP setting: yes Flu vaccine given in AP or PP setting: not applicable  Contraception: To be decided. Patient states that likely her husband will get a vasectomy. She declined any contraception in the interim.  Prenatal/Postnatal Panel: A POS+//Rubella Immune//RPR negative//HIV negative/HepB Surface Ag negative//plans to breastfeed  Plan:  [name] was  discharged to home in good condition. Follow-up appointment with Dr Glennon Mac at [facility] in 1 week for incision check.  Discharge Medications:   Medication List    TAKE these medications        cetirizine 10 MG tablet  Commonly known as:  ZYRTEC  Take 10 mg by mouth daily.     ibuprofen 600 MG tablet  Commonly known as:  ADVIL,MOTRIN  Take 1 tablet (600 mg total) by mouth every 6 (six) hours as needed for moderate pain.     multivitamin-prenatal 27-0.8 MG Tabs tablet  Take 1 tablet by mouth daily at 12 noon.     oxyCODONE-acetaminophen 5-325 MG per tablet  Commonly known as:  PERCOCET/ROXICET  Take 2 tablets by mouth every 4 (four) hours as needed for moderate pain.       Will Bonnet, MD, [place] 03/25/2015 12:14 PM

## 2015-03-22 NOTE — Progress Notes (Signed)
Surgery delayed because pt had consumed broth at 10am this morning. Anaesthesiologist chose to schedule the surgery for 6hours post prandial, 4:30 pm.

## 2015-03-22 NOTE — Progress Notes (Signed)
Doppler fetal heart tone in OR 152

## 2015-03-22 NOTE — Transfer of Care (Signed)
Immediate Anesthesia Transfer of Care Note  Patient: Sonya Spencer  Procedure(s) Performed: Procedure(s): CESAREAN SECTION (N/A)  Patient Location: LD2  Anesthesia Type:Spinal  Level of Consciousness: awake, alert , oriented and patient cooperative  Airway & Oxygen Therapy: Patient Spontanous Breathing  Post-op Assessment: Report given to RN and Post -op Vital signs reviewed and stable  Post vital signs: Reviewed and stable  Last Vitals:  Filed Vitals:   03/22/15 1812  BP: 112/62  Pulse:   Temp: 36.2 C  Resp: 16    Complications: No apparent anesthesia complications

## 2015-03-22 NOTE — H&P (Signed)
Obstetric History and Physical  Sonya Spencer is a 29 y.o. [id number] with IUP at [redacted]w[redacted]d presenting for IOL. H&P in chart currently   Prenatal Course Source of Care: [facility]  with onset of care at  7 weeks Pregnancy complications or risks:  Fetal macrosomia Anemia H/o 3rd degree tear with G2 Patient Active Problem List   Diagnosis Date Noted  . Labor and delivery indication for care or intervention 03/22/2015  . Amniotic fluid leaking 03/15/2015     Prenatal labs and studies: ABO, Rh: A/Positive/-- (11/05 0000) Antibody:   Rubella: Immune (11/05 0000) Varicella: immune RPR: Nonreactive (11/05 0000)  HBsAg: Negative (11/05 0000)  HIV: Non-reactive (11/05 0000)  GGE:ZMOQHUTM (06/06 0000) 1 hr Glucola  146 with normal 3 hour gtt  Genetic screening declined Anatomy US normal Tdap: not given  Flu: not given    Past Medical History  Diagnosis Date  . Medical history non-contributory   . Cholelithiasis   . Depression   . Fetal macrosomia during pregnancy   . Anemia   . H/O maternal third degree perineal laceration, currently pregnant     History reviewed. No pertinent past surgical history.  OB History  Gravida Para Term Preterm AB SAB TAB Ectopic Multiple Living  3 2 2  0 0 0 0 0 0 2    # Outcome Date GA Lbr Len/2nd Weight Sex Delivery Anes PTL Lv  3 Current           2 Term 06/02/12 [redacted]w[redacted]d  4.026 kg (8 lb 14 oz) F Vag-Spont EPI  Y  1 Term 01/02/10 [redacted]w[redacted]d  4.394 kg (9 lb 11 oz) F Vag-Vacuum EPI Y Y      History   Social History  . Marital Status: Married    Spouse Name: N/A  . Number of Children: N/A  . Years of Education: N/A   Social History Main Topics  . Smoking status: Former Smoker -- 0.25 packs/day for 2 years    Types: Cigarettes    Quit date: 03/13/2009  . Smokeless tobacco: Never Used  . Alcohol Use: No  . Drug Use: No  . Sexual Activity: Yes   Other Topics Concern  . None   Social History Narrative    Family History  Problem Relation  Age of Onset  . Lupus Mother   . Diabetes type I Father   . Lupus Sister     Prescriptions prior to admission  Medication Sig Dispense Refill Last Dose  . cetirizine (ZYRTEC) 10 MG tablet Take 10 mg by mouth daily.     . Prenatal Vit-Fe Fumarate-FA (MULTIVITAMIN-PRENATAL) 27-0.8 MG TABS tablet Take 1 tablet by mouth daily at 12 noon.   03/21/2015 at Unknown time    Allergies  Allergen Reactions  . Nickel Rash  . Red Dye Rash    Includes Benadryl as it has red dye in it.    Review of Systems: Negative except for what is mentioned in HPI.  Physical Exam: BP 132/79 mmHg  Pulse 88  Temp(Src) 97.9 F (36.6 C) (Oral)  Resp 20  Ht 5\' 5"  (1.651 m)  Wt 88.905 kg (196 lb)  BMI 32.62 kg/m2 GENERAL: Well-developed, well-nourished female in no acute distress.  LUNGS: Clear to auscultation bilaterally.  HEART: Regular rate and rhythm. ABDOMEN: Soft, nontender, nondistended, gravid. EXTREMITIES: Nontender, no edema, 2+ distal pulses. Dilation: 3 Effacement (%): 40 Cervical Position: Posterior Station: -2 Presentation: Vertex Exam by:: Dorr Perrot Presentation: cephalic   Bedside u/s performed to confirm presentation  FHT:  Baseline rate 145 bpm   Variability moderate  Accelerations present   Decelerations none Contractions: mild, irregular   Pertinent Labs/Studies:   No results found for this or any previous visit (from the past 24 hour(s)).  Assessment : Sonya Spencer is a 29 y.o. G3P2002 at [redacted]w[redacted]d being admitted for IOL for fetal macrosomia   Plan: Labor: Induction with pitocin, per protocol FWB: Reassuring fetal heart tracing.   GBS positive- ampicillin  Delivery plan: Hopeful for vaginal delivery  Louisa Second, [facility]

## 2015-03-22 NOTE — Progress Notes (Signed)
Provider discussed risks of a vaginal birth with large baby. Pt was advised that safest route would be c-section. Family and patient decided to consent to c-section. Pitocin stopped, anaesthesia and OR notified.

## 2015-03-22 NOTE — Op Note (Signed)
Cesarean Section Procedure Note   Sonya Spencer   03/22/2015   Pre-operative Diagnosis: [redacted] weeks pregnant, macrosomic fetus.   Post-operative Diagnosis: [redacted] weeks pregnant, macrosomic fetus.   Surgeon: Juliann Mule) and Role:    * Will Bonnet, MD - Primary   Anesthesia: spinal  Findings:  1) normal appearing gravid uterus, fallopian tubes, and ovaries 2) viable female infant with APGARS 8 at 1 minute and 9 at 5 minutes   Estimated Blood Loss: 1,000 mL   Total IV Fluids: 1,200 ml   Urine Output: 50 mL  Specimens: @ORSPECIMEN @   Complications: no complications  Disposition: PACU - hemodynamically stable.   Maternal Condition: stable   Baby condition / location:  Couplet care / Skin to Skin  Procedure Details:  The risks, benefits, complications, treatment options, and expected outcomes were discussed with the patient. The patient concurred with the proposed plan, giving informed consent. identified as Sonya Spencer and the procedure verified as C-Section Delivery. A Time Out was held and the above information confirmed.   After induction of anesthesia, the patient was draped and prepped in the usual sterile manner. A Pfannenstiel incision was made and carried down through the subcutaneous tissue to the fascia. Fascial incision was made and extended transversely. The fascia was separated from the underlying rectus tissue superiorly and inferiorly. The peritoneum was identified and entered. Peritoneal incision was extended longitudinally. The bladder flap was not bluntly freed from the lower uterine segment. A low transverse uterine incision was made and the hysterotomy was extended with cranial-caudal tension. Delivered from cephalic presentation was a 5,210 gram Living newborn infant(s) female with Apgar scores of 8 at one minute and 9 at five minutes. Cord ph was not sent the umbilical cord was clamped and cut cord blood was not obtained for evaluation. The placenta was removed  Intact and appeared normal. The uterine outline, tubes and ovaries appeared normal}. The uterine incision was closed with running locked sutures of 0 Vicryl.  A second layer of the same suture was thrown in an imbricating fashion.  Hemostasis was assured.  The uterus was retained to the abdomen and the paracolic gutters were cleared of all clots and debris.  The rectus muscles were inspected and found to be hemostatic.  The peritoneum was re-approximated in a running fashion using 0-vicryl.  The On-Q catheter pumps were inserted in accordance with the manufacturer's recommendations.  The catheters were inserted approximately 4cm cephelad to the incision line, approximately 1cm apart, straddling the midline.  They were inserted to a depth of the 4th mark. They were positioned superficial to the rectus abdominus muscles and deep to the rectus fascia.    The fascia was then reapproximated with running sutures of 1-0 PDS, looped.  The subcutaneous tissue was reapproximated with 3 interrupted 3-0 vicryl sutures to provide reinforcement to the skin closure. The subcuticular closure was performed using 4-0 monocryl. The skin closure was reinforced using benzoin and 1/2" steri-strips.  The On-Q catheters were bolused with 5 mL of 0.5% marcaine plain for a total of 10 mL.  The catheters were affixed to the skin with surgical skin glue, steri-strips, and tegaderm.    Instrument, sponge, and needle counts were correct prior the abdominal closure and were correct at the conclusion of the case.  The patient received Ancef 2 gram IV prior to skin incision (within 30 minutes). For VTE prophylaxis she was wearing SCDs throughout the case.  The tolerated the procedure well and was taken  to recovery in stable condition.   Signed: Will Bonnet, MD, [place] 03/22/2015 6:02 PM

## 2015-03-22 NOTE — Anesthesia Procedure Notes (Signed)
Spinal  Start time: 03/22/2015 4:40 PM End time: 03/22/2015 4:47 PM Staffing Resident/CRNA: Jonna Clark Performed by: resident/CRNA  Preanesthetic Checklist Completed: patient identified, site marked, surgical consent, pre-op evaluation, timeout performed, IV checked, risks and benefits discussed and monitors and equipment checked Spinal Block Patient position: sitting Prep: Betadine Patient monitoring: heart rate, continuous pulse ox and blood pressure Approach: midline Location: L3-4 Needle Needle type: Whitacre  Needle gauge: 25 G Needle length: 5 cm Assessment Sensory level: T3 Additional Notes Tolerated well.

## 2015-03-23 ENCOUNTER — Encounter: Payer: Self-pay | Admitting: Obstetrics and Gynecology

## 2015-03-23 LAB — CBC
HCT: 26.7 % — ABNORMAL LOW (ref 35.0–47.0)
Hemoglobin: 8.6 g/dL — ABNORMAL LOW (ref 12.0–16.0)
MCH: 23.8 pg — AB (ref 26.0–34.0)
MCHC: 32.2 g/dL (ref 32.0–36.0)
MCV: 73.8 fL — ABNORMAL LOW (ref 80.0–100.0)
Platelets: 219 10*3/uL (ref 150–440)
RBC: 3.62 MIL/uL — ABNORMAL LOW (ref 3.80–5.20)
RDW: 17.4 % — ABNORMAL HIGH (ref 11.5–14.5)
WBC: 15.9 10*3/uL — AB (ref 3.6–11.0)

## 2015-03-23 LAB — HIV ANTIBODY (ROUTINE TESTING W REFLEX): HIV SCREEN 4TH GENERATION: NONREACTIVE

## 2015-03-23 LAB — RPR: RPR Ser Ql: NONREACTIVE

## 2015-03-23 MED ORDER — IBUPROFEN 600 MG PO TABS
600.0000 mg | ORAL_TABLET | Freq: Four times a day (QID) | ORAL | Status: DC | PRN
  Administered 2015-03-24 – 2015-03-25 (×6): 600 mg via ORAL
  Filled 2015-03-23 (×6): qty 1

## 2015-03-23 MED ORDER — LACTATED RINGERS IV BOLUS (SEPSIS)
500.0000 mL | Freq: Once | INTRAVENOUS | Status: AC
  Administered 2015-03-23: 500 mL via INTRAVENOUS

## 2015-03-23 MED ORDER — OXYCODONE-ACETAMINOPHEN 5-325 MG PO TABS
2.0000 | ORAL_TABLET | ORAL | Status: DC | PRN
  Filled 2015-03-23: qty 2

## 2015-03-23 MED ORDER — OXYCODONE-ACETAMINOPHEN 5-325 MG PO TABS
1.0000 | ORAL_TABLET | ORAL | Status: DC | PRN
  Administered 2015-03-23 – 2015-03-25 (×9): 1 via ORAL
  Filled 2015-03-23 (×9): qty 1

## 2015-03-23 NOTE — Anesthesia Postprocedure Evaluation (Signed)
  Anesthesia Post-op Note  Patient: Sonya Spencer  Procedure(s) Performed: Procedure(s): CESAREAN SECTION (N/A)  Anesthesia type:Spinal  Patient location: 339  Post pain: Pain level controlled  Post assessment: Post-op Vital signs reviewed, Patient's Cardiovascular Status Stable, Respiratory Function Stable, Patent Airway and No signs of Nausea or vomiting  Post vital signs: Reviewed and stable  Last Vitals:  Filed Vitals:   03/23/15 0754  BP: 102/61  Pulse: 69  Temp: 36.4 C  Resp: 18    Level of consciousness: awake, alert  and patient cooperative  Complications: No apparent anesthesia complications

## 2015-03-23 NOTE — Anesthesia Post-op Follow-up Note (Signed)
  Anesthesia Pain Follow-up Note  Patient: Sonya Spencer  Day #: 1  Date of Follow-up: 03/23/2015 Time: 8:06 AM  Last Vitals:  Filed Vitals:   03/23/15 0754  BP: 102/61  Pulse: 69  Temp: 36.4 C  Resp: 18    Level of Consciousness: alert  Pain: none   Side Effects:None  Catheter Site Exam:clean, dry, no drainage  Plan: D/C from anesthesia care  Silvana Newness A

## 2015-03-24 NOTE — Progress Notes (Signed)
Patient ID: Sonya Spencer, female   DOB: Nov 17, 1985, 29 y.o.   MRN: [id number] Obstetric Postpartum/PostOperative Daily Progress Note Subjective:  29 y.o. [id number] POD#2 status post primary cesarean delivery.  She is ambulating, is tolerating po, is voiding spontaneously.  Her pain is well controlled on PO pain medications. Her lochia is less than menses.   Medications SCHEDULED MEDICATIONS  . ferrous sulfate  325 mg Oral BID WC  . prenatal multivitamin  1 tablet Oral Q1200  . scopolamine  1 patch Transdermal Once  . scopolamine  1 patch Transdermal Once  . simethicone  80 mg Oral TID PC  . sodium chloride  3 mL Intravenous Q12H    MEDICATION INFUSIONS  . sodium chloride    . bupivacaine ON-Q pain pump    . lactated ringers    . naLOXone (NARCAN) adult infusion for PRURITIS    . naLOXone (NARCAN) adult infusion for PRURITIS      PRN MEDICATIONS  acetaminophen **OR** acetaminophen, witch hazel-glycerin **AND** dibucaine, diphenhydrAMINE **OR** diphenhydrAMINE, fentaNYL (SUBLIMAZE) injection, ibuprofen, lanolin, menthol-cetylpyridinium, menthol-cetylpyridinium **OR** phenol, meperidine (DEMEROL) injection, nalbuphine **OR** nalbuphine, nalbuphine **OR** nalbuphine, naLOXone (NARCAN) adult infusion for PRURITIS, naLOXone (NARCAN) adult infusion for PRURITIS, naloxone **AND** sodium chloride, naloxone **AND** sodium chloride, ondansetron (ZOFRAN) IV, ondansetron (ZOFRAN) IV, ondansetron (ZOFRAN) IV, oxyCODONE-acetaminophen, oxyCODONE-acetaminophen, sodium chloride, Tdap    Objective:   Filed Vitals:   03/24/15 0121 03/24/15 0523 03/24/15 0727 03/24/15 1100  BP:   114/69   Pulse:   82   Temp: 98.3 F (36.8 C) 98.7 F (37.1 C) 97.7 F (36.5 C) 97.8 F (36.6 C)  TempSrc: Oral Oral Oral Oral  Resp:   18   Height:      Weight:      SpO2:   100%     Current Vital Signs 24h Vital Sign Ranges  T 97.8 F (36.6 C) Temp  Avg: 98.3 F (36.8 C)  Min: 97.7 F (36.5 C)  Max: 98.8 F (37.1  C)  BP 114/69 mmHg BP  Min: 108/55  Max: 114/69  HR 82 Pulse  Avg: 71.7  Min: 52  Max: 82  RR 18 Resp  Avg: 18  Min: 18  Max: 18  SaO2 100 % Not Delivered SpO2  Avg: 99.3 %  Min: 98 %  Max: 100 %       24 Hour I/O Current Shift I/O  Time Ins Outs 06/24 0701 - 06/25 0700 In: 0165 [P.O.:240; I.V.:1115] Out: 1250 [Urine:1250]    General: NAD Pulmonary: no increased work of breathing Abdomen: non-distended, non-tender, fundus firm at level of umbilicus Inc: Clean/dry/intact Extremities: no edema, no erythema, no tenderness  Labs:   Recent Labs Lab 03/22/15 0927 03/23/15 0508  WBC 11.4* 15.9*  HGB 9.4* 8.6*  HCT 30.1* 26.7*  PLT 272 219     Assessment:   29 y.o. G3P3003 postoperative day # 2, s/p primary cesarean section  Plan:  1) Acute blood loss anemia - hemodynamically stable and asymptomatic - po ferrous sulfate  2) A POS / Rubella Immune (11/05 0000)/ Varicella Immune  3) TDAP status not given this pregnancy  4) breast /Contraception = vasectomy  5) Disposition home tomorrow  Will Bonnet, MD, [place] 03/24/2015 12:19 PM

## 2015-03-25 DIAGNOSIS — O3663X0 Maternal care for excessive fetal growth, third trimester, not applicable or unspecified: Secondary | ICD-10-CM

## 2015-03-25 HISTORY — DX: Maternal care for excessive fetal growth, third trimester, not applicable or unspecified: O36.63X0

## 2015-03-25 MED ORDER — OXYCODONE-ACETAMINOPHEN 5-325 MG PO TABS: 2.0000 | ORAL_TABLET | ORAL | Status: DC | PRN

## 2015-03-25 MED ORDER — IBUPROFEN 600 MG PO TABS: 600.0000 mg | ORAL_TABLET | Freq: Four times a day (QID) | ORAL | Status: DC | PRN

## 2015-03-25 NOTE — Progress Notes (Signed)
Discharge instructions provided.  Pt and sig other verbalize understanding of all instructions and follow-up care.  Prescriptions given.  Pt discharged to home with infant at 1455 on 03/25/15 via wheelchair by volunteer. Reed Breech, RN 03/25/2015 6:03 PM

## 2015-03-25 NOTE — Discharge Instructions (Signed)
Postpartum Care After Cesarean Delivery °After you deliver your newborn (postpartum period), the usual stay in the hospital is 24-72 hours. If there were problems with your labor or delivery, or if you have other medical problems, you might be in the hospital longer.  °While you are in the hospital, you will receive help and instructions on how to care for yourself and your newborn during the postpartum period.  °While you are in the hospital: °· It is normal for you to have pain or discomfort from the incision in your abdomen. Be sure to tell your nurses when you are having pain, where the pain is located, and what makes the pain worse. °· If you are breastfeeding, you may feel uncomfortable contractions of your uterus for a couple of weeks. This is normal. The contractions help your uterus get back to normal size. °· It is normal to have some bleeding after delivery. °· For the first 1-3 days after delivery, the flow is red and the amount may be similar to a period. °· It is common for the flow to start and stop. °· In the first few days, you may pass some small clots. Let your nurses know if you begin to pass large clots or your flow increases. °· Do not  flush blood clots down the toilet before having the nurse look at them. °· During the next 3-10 days after delivery, your flow should become more watery and pink or brown-tinged in color. °· Ten to fourteen days after delivery, your flow should be a small amount of yellowish-white discharge. °· The amount of your flow will decrease over the first few weeks after delivery. Your flow may stop in 6-8 weeks. Most women have had their flow stop by 12 weeks after delivery. °· You should change your sanitary pads frequently. °· Wash your hands thoroughly with soap and water for at least 20 seconds after changing pads, using the toilet, or before holding or feeding your newborn. °· Your intravenous (IV) tubing will be removed when you are drinking enough fluids. °· The  urine drainage tube (urinary catheter) that was inserted before delivery may be removed within 6-8 hours after delivery or when feeling returns to your legs. You should feel like you need to empty your bladder within the first 6-8 hours after the catheter has been removed. °· In case you become weak, lightheaded, or faint, call your nurse before you get out of bed for the first time and before you take a shower for the first time. °· Within the first few days after delivery, your breasts may begin to feel tender and full. This is called engorgement. Breast tenderness usually goes away within 48-72 hours after engorgement occurs. You may also notice milk leaking from your breasts. If you are not breastfeeding, do not stimulate your breasts. Breast stimulation can make your breasts produce more milk. °· Spending as much time as possible with your newborn is very important. During this time, you and your newborn can feel close and get to know each other. Having your newborn stay in your room (rooming in) will help to strengthen the bond with your newborn. It will give you time to get to know your newborn and become comfortable caring for your newborn. °· Your hormones change after delivery. Sometimes the hormone changes can temporarily cause you to feel sad or tearful. These feelings should not last more than a few days. If these feelings last longer than that, you should talk to your   caregiver.  If desired, talk to your caregiver about methods of family planning or contraception.  Talk to your caregiver about immunizations. Your caregiver may want you to have the following immunizations before leaving the hospital:  Tetanus, diphtheria, and pertussis (Tdap) or tetanus and diphtheria (Td) immunization. It is very important that you and your family (including grandparents) or others caring for your newborn are up-to-date with the Tdap or Td immunizations. The Tdap or Td immunization can help protect  your newborn from getting ill.  Rubella immunization.  Varicella (chickenpox) immunization.  Influenza immunization. You should receive this annual immunization if you did not receive the immunization during your pregnancy. Document Released: 06/09/2012 Document Reviewed: 06/09/2012 [facility] Patient Information 2015 [place]. This information is not intended to replace advice given to you by your health care provider. Make sure you discuss any questions you have with your health care provider.  Call your doctor for increased pain or vaginal bleeding, temperature above 100.4, depression, or concerns.  No strenuous activity or heavy lifting for 6 weeks.  No intercourse, tampons, douching, or enemas for 6 weeks.  No tub baths-showers only.  No driving for 2 weeks or while taking pain medications.  Continue prenatal vitamin and iron.  Keep incision clean and dry.  Call your doctor for incision concerns including redness, swelling, bleeding or drainage, or if begins to come apart.  Increase calories and fluids while breastfeeding.

## 2017-02-05 ENCOUNTER — Other Ambulatory Visit: Payer: Self-pay | Admitting: Obstetrics and Gynecology

## 2017-02-09 NOTE — Telephone Encounter (Signed)
Pt is calling needing an refill on her Medication Pt is schedule on 02/10/17 for PP depression. [place] CB# [phone]

## 2017-02-10 ENCOUNTER — Ambulatory Visit: Payer: Self-pay | Admitting: Obstetrics and Gynecology

## 2017-02-10 NOTE — Telephone Encounter (Signed)
Refilling medication today at appt

## 2017-02-10 NOTE — Telephone Encounter (Signed)
Pt's appt is today with SDJ

## 2017-02-16 NOTE — Telephone Encounter (Signed)
Pt calling.  Pharm hasn't heard from [place] re pt's refill on citalopram and buproprion.  Please call [phone]

## 2017-02-20 ENCOUNTER — Encounter: Payer: Self-pay | Admitting: Obstetrics and Gynecology

## 2017-02-20 NOTE — Telephone Encounter (Signed)
Patient has not been seen in a while and I imagine she is out of medication and has been for some time. She was supposed to follow up on 5/15, but did not show.   She needs an appointment prior to refill.

## 2017-02-20 NOTE — Telephone Encounter (Signed)
Pt aware, via voicemail, needs an appointment before refill can be given. KJ CMA

## 2017-05-11 ENCOUNTER — Encounter: Payer: Self-pay | Admitting: Nurse Practitioner

## 2017-05-11 ENCOUNTER — Ambulatory Visit (INDEPENDENT_AMBULATORY_CARE_PROVIDER_SITE_OTHER): Payer: Commercial Managed Care - PPO | Admitting: Nurse Practitioner

## 2017-05-11 VITALS — BP 111/60 | HR 77 | Temp 98.3°F | Ht 62.75 in | Wt 195.0 lb

## 2017-05-11 DIAGNOSIS — R51 Headache: Secondary | ICD-10-CM

## 2017-05-11 DIAGNOSIS — F419 Anxiety disorder, unspecified: Secondary | ICD-10-CM

## 2017-05-11 DIAGNOSIS — Z7689 Persons encountering health services in other specified circumstances: Secondary | ICD-10-CM

## 2017-05-11 DIAGNOSIS — R519 Headache, unspecified: Secondary | ICD-10-CM | POA: Insufficient documentation

## 2017-05-11 DIAGNOSIS — F32A Depression, unspecified: Secondary | ICD-10-CM | POA: Insufficient documentation

## 2017-05-11 DIAGNOSIS — M722 Plantar fascial fibromatosis: Secondary | ICD-10-CM

## 2017-05-11 DIAGNOSIS — F329 Major depressive disorder, single episode, unspecified: Secondary | ICD-10-CM

## 2017-05-11 DIAGNOSIS — O09299 Supervision of pregnancy with other poor reproductive or obstetric history, unspecified trimester: Secondary | ICD-10-CM | POA: Insufficient documentation

## 2017-05-11 DIAGNOSIS — F411 Generalized anxiety disorder: Secondary | ICD-10-CM | POA: Insufficient documentation

## 2017-05-11 MED ORDER — ESCITALOPRAM OXALATE 10 MG PO TABS: 10.0000 mg | ORAL_TABLET | Freq: Every day | ORAL | 1 refills | Status: DC

## 2017-05-11 MED ORDER — ESCITALOPRAM OXALATE 5 MG PO TABS: ORAL_TABLET | ORAL | 0 refills | Status: DC

## 2017-05-11 NOTE — Patient Instructions (Signed)
Sonya Spencer, Thank you for coming in to clinic today.  1. For your depression and anxiety: - start escitalopram 5 mg once daily for 2 weeks, then increase to 10 mg once daily and continue until your next visit.  2. Headaches:  -avoid caffeine - wean down slowly.  3. For your plantar fasciitis:  -continue your stretches and exercise.  4. For your incontinence:  - Start w/ kegel exercises.  If no improvement, we can consider pelvic floor training w/ Physical Therapy.  Please schedule a follow-up appointment with Cassell Smiles, AGNP. Return in about 6 weeks (around 06/22/2017) for anxiety and depression.  If you have any other questions or concerns, please feel free to call the clinic or send a message through [name]. You may also schedule an earlier appointment if necessary.  You will receive a survey after today's visit either digitally by e-mail or paper by [employer]. Your experiences and feedback matter to [place].  Please respond so we know how we are doing as we provide care for you.  Cassell Smiles, DNP, AGNP-BC Adult Gerontology Nurse Practitioner [facility], [facility]   Plantar Fasciitis Rehab Ask your health care provider which exercises are safe for you. Do exercises exactly as told by your health care provider and adjust them as directed. It is normal to feel mild stretching, pulling, tightness, or discomfort as you do these exercises, but you should stop right away if you feel sudden pain or your pain gets worse. Do not begin these exercises until told by your health care provider. Stretching and range of motion exercises These exercises warm up your muscles and joints and improve the movement and flexibility of your foot. These exercises also help to relieve pain. Exercise A: Plantar fascia stretch  1. Sit with your left / right leg crossed over your opposite knee. 2. Hold your heel with one hand with that thumb near your arch. With your other hand, hold your toes and  gently pull them back toward the top of your foot. You should feel a stretch on the bottom of your toes or your foot or both. 3. Hold this stretch for__________ seconds. 4. Slowly release your toes and return to the starting position. Repeat __________ times. Complete this exercise __________ times a day. Exercise B: Gastroc, standing  1. Stand with your hands against a wall. 2. Extend your left / right leg behind you, and bend your front knee slightly. 3. Keeping your heels on the floor and keeping your back knee straight, shift your weight toward the wall without arching your back. You should feel a gentle stretch in your left / right calf. 4. Hold this position for __________ seconds. Repeat __________ times. Complete this exercise __________ times a day. Exercise C: Soleus, standing 1. Stand with your hands against a wall. 2. Extend your left / right leg behind you, and bend your front knee slightly. 3. Keeping your heels on the floor, bend your back knee and slightly shift your weight over the back leg. You should feel a gentle stretch deep in your calf. 4. Hold this position for __________ seconds. Repeat __________ times. Complete this exercise __________ times a day. Exercise D: Gastrocsoleus, standing 1. Stand with the ball of your left / right foot on a step. The ball of your foot is on the walking surface, right under your toes. 2. Keep your other foot firmly on the same step. 3. Hold onto the wall or a railing for balance. 4. Slowly lift  your other foot, allowing your body weight to press your heel down over the edge of the step. You should feel a stretch in your left / right calf. 5. Hold this position for __________ seconds. 6. Return both feet to the step. 7. Repeat this exercise with a slight bend in your left / right knee. Repeat __________ times with your left / right knee straight and __________ times with your left / right knee bent. Complete this exercise __________  times a day. Balance exercise This exercise builds your balance and strength control of your arch to help take pressure off your plantar fascia. Exercise E: Single leg stand 1. Without shoes, stand near a railing or in a doorway. You may hold onto the railing or door frame as needed. 2. Stand on your left / right foot. Keep your big toe down on the floor and try to keep your arch lifted. Do not let your foot roll inward. 3. Hold this position for __________ seconds. 4. If this exercise is too easy, you can try it with your eyes closed or while standing on a pillow. Repeat __________ times. Complete this exercise __________ times a day. This information is not intended to replace advice given to you by your health care provider. Make sure you discuss any questions you have with your health care provider. Document Released: 09/15/2005 Document Revised: 05/20/2016 Document Reviewed: 07/30/2015 Elsevier Interactive Patient Education  2018 [employer].

## 2017-05-11 NOTE — Assessment & Plan Note (Signed)
Active difficulty w/ phq 9 = 18 and GAD7=15.  Pt previously on medications (escitalopram and bupropion).  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  Pt notes she is still breastfeeding, so wants to make sure is safe for baby.  Pt previously on these medications while breastfeeding.  Plan: 1. Resume escitalopram at 5 mg once daily x 2 weeks, then increase to 10 mg once daily.  2. Encouraged self-care. 3. Follow up 6 weeks.

## 2017-05-11 NOTE — Progress Notes (Addendum)
Subjective:    Patient ID: Sonya Spencer, female    DOB: 1986/03/03, 31 y.o.   MRN: [id number]  Sonya Spencer is a 31 y.o. female presenting on 05/11/2017 for [facility] Pt last seen by PCP 3 years ago and w/ OBGYN about 6 months ago.  Obtain records from [facility] and from [facility].   Anxiety and Depression Previously managed by OBGYN - last on medications about 2 months ago.  Started post-partum after difficulty w/ depression at end of pregnancy.  Depression worsened during immediate postpartum period, but has seemed to be improving per pt.  - Predominant symptoms are anxiety, being on edge, and irritability with children occasionally.     - Breastfeeding 1-2x daily and is in process of weaning off.  Prior to running out of medication 2 months ago, pt was taking escitalopram (20 mg once daily) and bupropion (2 tabs daily - 150 mg)  When she ran out, she did not taper down dose and noted nausea, tremor, more feelings of being "on edge."  Stress Incontinence Pt notes worsening stress incontinence w/ cough/sneeze after last pregnancy.  Notes she has had 2 vaginal deliveries, C-section for 12 lb baby about 14 months ago.  She does note some small improvement of symptoms.  She only leaks small amount of urine, does not need to change clothing.  Can anticipate sneeze/cough and notes less leakage when she crosses her legs.  Has not been performing Kegel exercises.  Plantar Fasciitis Flip flops, exercises, shoe inserts/sneakers now and is only noticing small improvements.  Has bee told she may need to see podiatry when she was last seen at [facility] in July 2018.  Depression screen PHQ 2/9 05/11/2017  Decreased Interest 2  Down, Depressed, Hopeless 2  PHQ - 2 Score 4  Altered sleeping 3  Tired, decreased energy 3  Change in appetite 3  Feeling bad or failure about yourself  2  Trouble concentrating 2  Moving slowly or fidgety/restless 0    Suicidal thoughts 1  PHQ-9 Score 18   GAD 7 : Generalized Anxiety Score 05/11/2017  Nervous, Anxious, on Edge 3  Control/stop worrying 2  Worry too much - different things 2  Trouble relaxing 3  Restless 0  Easily annoyed or irritable 3  Afraid - awful might happen 2  Total GAD 7 Score 15  Anxiety Difficulty Extremely difficult    Past Medical History:  Diagnosis Date  . Allergy   . Anemia   . Anxiety   . Cholelithiasis   . Depression   . Fetal macrosomia during pregnancy   . Frequent headaches   . H/O maternal third degree perineal laceration, currently pregnant   . Medical history non-contributory   . Urinary incontinence    Past Surgical History:  Procedure Laterality Date  . CESAREAN SECTION N/A 03/22/2015   Procedure: CESAREAN SECTION;  Surgeon: Will Bonnet, MD;  Location: [facility] ORS;  Service: Obstetrics;  Laterality: N/A;   Social History   Social History  . Marital status: Married    Spouse name: N/A  . Number of children: N/A  . Years of education: N/A   Occupational History  . Not on file.   Social History Main Topics  . Smoking status: Former Smoker    Packs/day: 0.25    Years: 2.00    Types: Cigarettes    Quit date: 03/13/2009  . Smokeless tobacco: Never Used  . Alcohol use  Yes  . Drug use: No  . Sexual activity: Yes   Other Topics Concern  . Not on file   Social History Narrative  . No narrative on file   Family History  Problem Relation Age of Onset  . Lupus Mother   . Diabetes type I Father   . Diabetes Father   . Thyroid disease Father   . Lupus Sister   . Lupus Maternal Grandmother   . Diabetes Mellitus I Daughter    Current Outpatient Prescriptions on File Prior to Visit  Medication Sig  . cetirizine (ZYRTEC) 10 MG tablet Take 10 mg by mouth daily.  Marland Kitchen ibuprofen (ADVIL,MOTRIN) 600 MG tablet Take 1 tablet (600 mg total) by mouth every 6 (six) hours as needed for moderate pain.  Marland Kitchen oxyCODONE-acetaminophen (PERCOCET/ROXICET)  5-325 MG per tablet Take 2 tablets by mouth every 4 (four) hours as needed for moderate pain.  . Prenatal Vit-Fe Fumarate-FA (MULTIVITAMIN-PRENATAL) 27-0.8 MG TABS tablet Take 1 tablet by mouth daily at 12 noon.   No current facility-administered medications on file prior to visit.     Review of Systems  Genitourinary:       Urinary incontinence - sneeze/cough  Musculoskeletal: Positive for arthralgias.  Neurological: Positive for headaches.       Headaches are responsive usually to ibuprofen, acetaminophen, caffeine.  Does note caffeine withdrawal headache.  Has cut down from 3 sodas daily to 1 soda daily.  Psychiatric/Behavioral: Positive for suicidal ideas.  All other systems reviewed and are negative.  Per HPI unless specifically indicated above     Objective:    BP 111/60 (BP Location: Right Arm, Patient Position: Sitting, Cuff Size: Normal)   Pulse 77   Temp 98.3 F (36.8 C) (Oral)   Ht 5' 2.75" (1.594 m)   Wt 195 lb (88.5 kg)   LMP 04/27/2017   Breastfeeding? Yes   BMI 34.82 kg/m   Wt Readings from Last 3 Encounters:  05/11/17 195 lb (88.5 kg)  03/22/15 196 lb (88.9 kg)  03/15/15 190 lb (86.2 kg)    Physical Exam  General - obese, well-appearing, NAD HEENT - Normocephalic, atraumatic Neck - supple, non-tender, no LAD Heart - RRR, no murmurs heard Lungs - Clear throughout all lobes, no wheezing, crackles, or rhonchi. Normal work of breathing. Extremeties - non-tender, no edema, cap refill < 2 seconds, peripheral pulses intact +2 bilaterally Skin - warm, dry Neuro - awake, alert, oriented x3, normal gait Psych - Normal mood and affect, normal behavior    Results for orders placed or performed during the hospital encounter of 03/22/15  OB RESULT CONSOLE Group B Strep  Result Value Ref Range   GBS Positive   HIV antibody  Result Value Ref Range   HIV Screen 4th Generation wRfx Non Reactive Non Reactive  CBC  Result Value Ref Range   WBC 11.4 (H) 3.6 - 11.0  K/uL   RBC 4.03 3.80 - 5.20 MIL/uL   Hemoglobin 9.4 (L) 12.0 - 16.0 g/dL   HCT 30.1 (L) 35.0 - 47.0 %   MCV 74.7 (L) 80.0 - 100.0 fL   MCH 23.4 (L) 26.0 - 34.0 pg   MCHC 31.4 (L) 32.0 - 36.0 g/dL   RDW 17.9 (H) 11.5 - 14.5 %   Platelets 272 150 - 440 K/uL  RPR  Result Value Ref Range   RPR Ser Ql Non Reactive Non Reactive  OB RESULTS CONSOLE GC/Chlamydia  Result Value Ref Range   Gonorrhea Negative  Chlamydia Negative   OB RESULTS CONSOLE RPR  Result Value Ref Range   RPR Nonreactive   OB RESULTS CONSOLE HIV antibody  Result Value Ref Range   HIV Non-reactive   OB RESULTS CONSOLE Rubella Antibody  Result Value Ref Range   Rubella Immune   OB RESULTS CONSOLE Varicella zoster antibody, IgG  Result Value Ref Range   Varicella Immune   OB RESULTS CONSOLE Hepatitis B surface antigen  Result Value Ref Range   Hepatitis B Surface Ag Negative   CBC  Result Value Ref Range   WBC 15.9 (H) 3.6 - 11.0 K/uL   RBC 3.62 (L) 3.80 - 5.20 MIL/uL   Hemoglobin 8.6 (L) 12.0 - 16.0 g/dL   HCT 26.7 (L) 35.0 - 47.0 %   MCV 73.8 (L) 80.0 - 100.0 fL   MCH 23.8 (L) 26.0 - 34.0 pg   MCHC 32.2 32.0 - 36.0 g/dL   RDW 17.4 (H) 11.5 - 14.5 %   Platelets 219 150 - 440 K/uL  Type and screen  Result Value Ref Range   ABO/RH(D) A POS    Antibody Screen NEG    Sample Expiration 03/25/2015   OB RESULTS CONSOLE ABO/Rh  Result Value Ref Range   RH Type  Positive    ABO Grouping A   ABO/Rh  Result Value Ref Range   ABO/RH(D) A POS       Assessment & Plan:   Problem List Items Addressed This Visit      Other   Anxiety and depression - Primary    Active difficulty w/ phq 9 = 18 and GAD7=15.  Pt previously on medications (escitalopram and bupropion).  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  Pt notes she is still breastfeeding, so wants to make sure is safe for baby.  Pt previously on these medications while  breastfeeding.  Plan: 1. Resume escitalopram at 5 mg once daily x 2 weeks, then increase to 10 mg once daily.  2. Encouraged self-care. 3. Follow up 6 weeks.      Relevant Medications   escitalopram (LEXAPRO) 5 MG tablet   escitalopram (LEXAPRO) 10 MG tablet (Start on 06/08/2017)   Frequent headaches    Pt notes improvement w/ medications and/or caffeine.  Endorses caffeine withdrawal is one trigger for headaches.  Plan: 1. Encouraged caffeine abstinence.  Reduce slowly.   2. Follow up as needed if no improvement.      Relevant Medications   escitalopram (LEXAPRO) 5 MG tablet   escitalopram (LEXAPRO) 10 MG tablet (Start on 06/08/2017)    Other Visit Diagnoses    Encounter to establish care     Pt w/ recent care at [facility] and [facility]. Obtain records from [facility].  Past medical, family, and surgical history reviewed.    Plantar fasciitis, bilateral     Persistent problem w/ recent evaluation in last 4 weeks.  Pt currently completing conservative therapy as previously instructed.    Plan: 1. Continue conservative therapy w/ stretch/ strength, anti-inflammatories. 2. 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 3. Follow up as needed.  Will consider podiatry as needed in future.      Meds ordered this encounter  Medications  . escitalopram (LEXAPRO) 5 MG tablet    Sig: Days 1-14: take one tablet once daily.  Days 15-30: take two tablets once daily.    Dispense:  46  tablet    Refill:  0    Order Specific Question:   Supervising Provider    Answer:   Olin Hauser [[id number]]  . escitalopram (LEXAPRO) 10 MG tablet    Sig: Take 1 tablet (10 mg total) by mouth daily.    Dispense:  30 tablet    Refill:  1    Order Specific Question:   Supervising Provider    Answer:   Olin Hauser [[id number]]      Follow up plan: Return in about 6 weeks (around 06/22/2017) for anxiety and depression.  Cassell Smiles, DNP, AGPCNP-BC Adult Gerontology Primary Care Nurse Practitioner [facility] 05/11/2017, 11:02 AM

## 2017-05-11 NOTE — Assessment & Plan Note (Signed)
Pt notes improvement w/ medications and/or caffeine.  Endorses caffeine withdrawal is one trigger for headaches.  Plan: 1. Encouraged caffeine abstinence.  Reduce slowly.   2. Follow up as needed if no improvement.

## 2017-05-11 NOTE — Progress Notes (Signed)
I have reviewed this encounter including the documentation in this note and/or discussed this patient with the provider, Cassell Smiles, AGPCNP-BC. I am certifying that I agree with the content of this note as supervising physician.  Nobie Putnam, [facility] 05/11/2017, 5:21 PM

## 2017-05-22 ENCOUNTER — Other Ambulatory Visit: Payer: Self-pay

## 2017-06-22 ENCOUNTER — Encounter: Payer: Self-pay | Admitting: Nurse Practitioner

## 2017-06-22 ENCOUNTER — Ambulatory Visit (INDEPENDENT_AMBULATORY_CARE_PROVIDER_SITE_OTHER): Payer: Commercial Managed Care - PPO | Admitting: Nurse Practitioner

## 2017-06-22 DIAGNOSIS — F329 Major depressive disorder, single episode, unspecified: Secondary | ICD-10-CM | POA: Diagnosis not present

## 2017-06-22 DIAGNOSIS — F32A Depression, unspecified: Secondary | ICD-10-CM

## 2017-06-22 DIAGNOSIS — F419 Anxiety disorder, unspecified: Secondary | ICD-10-CM

## 2017-06-22 DIAGNOSIS — D1720 Benign lipomatous neoplasm of skin and subcutaneous tissue of unspecified limb: Secondary | ICD-10-CM | POA: Insufficient documentation

## 2017-06-22 MED ORDER — ESCITALOPRAM OXALATE 20 MG PO TABS: 20.0000 mg | ORAL_TABLET | Freq: Every day | ORAL | 1 refills | Status: DC

## 2017-06-22 NOTE — Progress Notes (Signed)
Subjective:    Patient ID: Sonya Spencer, female    DOB: 04-15-1986, 31 y.o.   MRN: [id number]  Sonya Spencer is a 31 y.o. female presenting on 06/22/2017 for Anxiety   HPI Anxiety and depression - Resumed on escitalopram 10 mg once daily at last visit. - Most bothersome symptoms: low energy and irritability, overeating. - Significant improvement for pt ability to provide care to children. - Still breastfeeding bid.  - Sleeps 9:30-10p, awake at 6:30p  Usually interrupted and falls asleep in 20-25 mins,  Then starts going through to-do list.  Subcutaneous growth in underarm Soft rounded bulge on left underarm appeared several months ago and has gotten larger over time.  Pt asks what is may be and if it is harmful. She states is non-tender, has never been red, hot to touch, or draining.  Depression screen [facility] 2/9 06/22/2017 05/11/2017  Decreased Interest 1 2  Down, Depressed, Hopeless 1 2  PHQ - 2 Score 2 4  Altered sleeping 2 3  Tired, decreased energy 1 3  Change in appetite 2 3  Feeling bad or failure about yourself  1 2  Trouble concentrating 1 2  Moving slowly or fidgety/restless 0 0  Suicidal thoughts 0 1  PHQ-9 Score 9 18  Difficult doing work/chores Somewhat difficult -   GAD 7 : Generalized Anxiety Score 06/22/2017 05/11/2017  Nervous, Anxious, on Edge 1 3  Control/stop worrying 2 2  Worry too much - different things 1 2  Trouble relaxing 1 3  Restless 0 0  Easily annoyed or irritable 1 3  Afraid - awful might happen 2 2  Total GAD 7 Score 8 15  Anxiety Difficulty Somewhat difficult Extremely difficult    Social History  Substance Use Topics  . Smoking status: Former Smoker    Packs/day: 0.25    Years: 2.00    Types: Cigarettes    Quit date: 03/13/2009  . Smokeless tobacco: Never Used  . Alcohol use Yes    Review of Systems Per HPI unless specifically indicated above     Objective:    BP 114/71 (BP Location: Right Arm, Patient Position: Sitting,  Cuff Size: Normal)   Pulse 80   Temp 98.6 F (37 C) (Oral)   Ht 5' 2.75" (1.594 m)   Wt 198 lb 6.4 oz (90 kg)   BMI 35.43 kg/m   Wt Readings from Last 3 Encounters:  06/22/17 198 lb 6.4 oz (90 kg)  05/11/17 195 lb (88.5 kg)  03/22/15 196 lb (88.9 kg)    Physical Exam  General - obese, well-appearing, NAD HEENT - Normocephalic, atraumatic Neck - supple, non-tender, NAD Heart - RRR, no murmurs heard Lungs - Clear throughout all lobes, no wheezing, crackles, or rhonchi. Normal work of breathing. Extremeties - non-tender, no edema, cap refill < 2 seconds, peripheral pulses intact +2 bilaterally.  Wearing boot on left foot for flare of tendinitis and plantar fasciitis as directed in past by orthopedics. Skin - warm, dry, soft cystic subcutaneous tissue approx 3 inches in diameter in left axilla c/w lipoma.  No swelling of left arm. Neuro - awake, alert, oriented x3, normal gait Psych - Normal mood and affect, normal behavior     Results for orders placed or performed during the hospital encounter of 03/22/15  OB RESULT CONSOLE Group B Strep  Result Value Ref Range   GBS Positive   HIV antibody  Result Value Ref Range   HIV Screen 4th Generation wRfx  Non Reactive Non Reactive  CBC  Result Value Ref Range   WBC 11.4 (H) 3.6 - 11.0 K/uL   RBC 4.03 3.80 - 5.20 MIL/uL   Hemoglobin 9.4 (L) 12.0 - 16.0 g/dL   HCT 30.1 (L) 35.0 - 47.0 %   MCV 74.7 (L) 80.0 - 100.0 fL   MCH 23.4 (L) 26.0 - 34.0 pg   MCHC 31.4 (L) 32.0 - 36.0 g/dL   RDW 17.9 (H) 11.5 - 14.5 %   Platelets 272 150 - 440 K/uL  RPR  Result Value Ref Range   RPR Ser Ql Non Reactive Non Reactive  OB RESULTS CONSOLE GC/Chlamydia  Result Value Ref Range   Gonorrhea Negative    Chlamydia Negative   OB RESULTS CONSOLE RPR  Result Value Ref Range   RPR Nonreactive   OB RESULTS CONSOLE HIV antibody  Result Value Ref Range   HIV Non-reactive   OB RESULTS CONSOLE Rubella Antibody  Result Value Ref Range   Rubella Immune    OB RESULTS CONSOLE Varicella zoster antibody, IgG  Result Value Ref Range   Varicella Immune   OB RESULTS CONSOLE Hepatitis B surface antigen  Result Value Ref Range   Hepatitis B Surface Ag Negative   CBC  Result Value Ref Range   WBC 15.9 (H) 3.6 - 11.0 K/uL   RBC 3.62 (L) 3.80 - 5.20 MIL/uL   Hemoglobin 8.6 (L) 12.0 - 16.0 g/dL   HCT 26.7 (L) 35.0 - 47.0 %   MCV 73.8 (L) 80.0 - 100.0 fL   MCH 23.8 (L) 26.0 - 34.0 pg   MCHC 32.2 32.0 - 36.0 g/dL   RDW 17.4 (H) 11.5 - 14.5 %   Platelets 219 150 - 440 K/uL  Type and screen  Result Value Ref Range   ABO/RH(D) A POS    Antibody Screen NEG    Sample Expiration 03/25/2015   OB RESULTS CONSOLE ABO/Rh  Result Value Ref Range   RH Type  Positive    ABO Grouping A   ABO/Rh  Result Value Ref Range   ABO/RH(D) A POS       Assessment & Plan:   Problem List Items Addressed This Visit      Other   Anxiety and depression    Improving on medications since last visit 6 weeks ago w/ 50% reduction of PHQ9 and GAD7 scores.  Pt notes she is still breastfeeding, so wants to make sure is safe for baby.  Pt previously on escitalopram 20 mg once daily initiated by OBGYN with Wellbutrin XR 300 mg per day while breastfeeding.  Meds reviewed again and escitalopram has lower percentage of transmitted medication in breastmilk than Wellbutrin.  Plan: 1. Increase escitalopram to 20 mg once daily.  2. Encouraged self-care for exercise and proper diet, good sleep hygiene. 3. Follow up 30 or as needed in 6 weeks if needing more symptom control.      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 Should also consider cyst as possible diagnosis.  Plan: 1. Reviewed nature of lipoma as benign, require I&D surgical removal if becomes irritating/cosmetically bothersome. 2. Also reviewed s/sx of infected cyst as possibility and to seek care  in clinic if occurs. 3. Follow up as needed, Reassured likely benign.         Meds ordered this encounter  Medications  .  Multiple Vitamins-Minerals (WOMENS ONE DAILY) TABS    Sig: Take by mouth.  . escitalopram (LEXAPRO) 20 MG tablet    Sig: Take 1 tablet (20 mg total) by mouth daily.    Dispense:  90 tablet    Refill:  1     Follow up plan: Return in about 3 months (around 09/21/2017) for anxiety and depression OR as needed in 6 weeks if symptoms not improving significantly on higher dose of medications.  Cassell Smiles, DNP, AGPCNP-BC Adult Gerontology Primary Care Nurse Practitioner [facility] 06/22/2017, 1:15 PM

## 2017-06-22 NOTE — Assessment & Plan Note (Addendum)
Improving on medications since last visit 6 weeks ago w/ 50% reduction of PHQ9 and GAD7 scores.  Pt notes she is still breastfeeding, so wants to make sure is safe for baby.  Pt previously on escitalopram 20 mg once daily initiated by OBGYN with Wellbutrin XR 300 mg per day while breastfeeding.  Meds reviewed again and escitalopram has lower percentage of transmitted medication in breastmilk than Wellbutrin.  Plan: 1. Increase escitalopram to 20 mg once daily.  2. Encouraged self-care for exercise and proper diet, good sleep hygiene. 3. Follow up 30 or as needed in 6 weeks if needing more symptom control.

## 2017-06-22 NOTE — Patient Instructions (Addendum)
Sonya Spencer, Thank you for coming in to clinic today.  1. For your anxiety and depression - INCREASE your escitalopram to 20 mg once daily.  Until you are out of your current bottle, take 2 tablets once daily.  Once you refill, you will have 20 mg tablets, so only take 1 tablet once daily.  2. Continue your work on sleep hygiene.  Sleep Hygiene Tips  Take medicines only as directed by your health care provider.  Keep regular sleeping and waking hours. Avoid naps.  Keep a sleep diary to help you and your health care provider figure out what could be causing your insomnia. Include:  When you sleep.  When you wake up during the night.  How well you sleep.  How rested you feel the next day.  Any side effects of medicines you are taking.  What you eat and drink.  Make your bedroom a comfortable place where it is easy to fall asleep:  Put up shades or special blackout curtains to block light from outside.  Use a white noise machine to block noise.  Keep the temperature cool.  Exercise regularly as directed by your health care provider. Avoid exercising right before bedtime.  Use relaxation techniques to manage stress. Ask your health care provider to suggest some techniques that may work well for you. These may include:  Breathing exercises.  Routines to release muscle tension.  Visualizing peaceful scenes.  Cut back on alcohol, caffeinated beverages, and cigarettes, especially close to bedtime. These can disrupt your sleep.  Do not overeat or eat spicy foods right before bedtime. This can lead to digestive discomfort that can make it hard for you to sleep.  Limit screen use before bedtime. This includes:  Watching TV.  Using your smartphone, tablet, and computer.  Stick to a routine. This can help you fall asleep faster. Try to do a quiet activity, brush your teeth, and go to bed at the same time each night.  Get out of bed if you are still awake after 15 minutes of  trying to sleep. Keep the lights down, but try reading or doing a quiet activity. When you feel sleepy, go back to bed.  Make sure that you drive carefully. Avoid driving if you feel very sleepy.  Keep all follow-up appointments as directed by your health care provider. This is important.   Please schedule a follow-up appointment with Cassell Smiles, AGNP. Return in about 3 months (around 09/21/2017) for anxiety and depression OR AS NEEDED in 6 weeks if symptoms are not improving significantly.  If you have any other questions or concerns, please feel free to call the clinic or send a message through [facility]. You may also schedule an earlier appointment if necessary.  You will receive a survey after today's visit either digitally by e-mail or paper by [employer]. Your experiences and feedback matter to [place].  Please respond so we know how we are doing as we provide care for you.   Cassell Smiles, DNP, AGNP-BC Adult Gerontology Nurse Practitioner [place]

## 2017-06-22 NOTE — Assessment & Plan Note (Signed)
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

## 2017-06-23 ENCOUNTER — Other Ambulatory Visit: Payer: Self-pay

## 2017-06-23 DIAGNOSIS — F419 Anxiety disorder, unspecified: Principal | ICD-10-CM

## 2017-06-23 DIAGNOSIS — F329 Major depressive disorder, single episode, unspecified: Secondary | ICD-10-CM

## 2017-06-23 MED ORDER — ESCITALOPRAM OXALATE 20 MG PO TABS: 20.0000 mg | ORAL_TABLET | Freq: Every day | ORAL | 1 refills | Status: DC

## 2017-06-25 ENCOUNTER — Other Ambulatory Visit: Payer: Self-pay

## 2017-07-23 ENCOUNTER — Encounter: Payer: Self-pay | Admitting: Nurse Practitioner

## 2017-10-05 ENCOUNTER — Encounter: Payer: Self-pay | Admitting: Nurse Practitioner

## 2017-10-05 ENCOUNTER — Ambulatory Visit (INDEPENDENT_AMBULATORY_CARE_PROVIDER_SITE_OTHER): Payer: Commercial Managed Care - PPO | Admitting: Nurse Practitioner

## 2017-10-05 DIAGNOSIS — F32A Depression, unspecified: Secondary | ICD-10-CM

## 2017-10-05 DIAGNOSIS — F329 Major depressive disorder, single episode, unspecified: Secondary | ICD-10-CM | POA: Diagnosis not present

## 2017-10-05 DIAGNOSIS — F419 Anxiety disorder, unspecified: Secondary | ICD-10-CM | POA: Diagnosis not present

## 2017-10-05 MED ORDER — ESCITALOPRAM OXALATE 20 MG PO TABS: 20.0000 mg | ORAL_TABLET | Freq: Every day | ORAL | 3 refills | Status: DC

## 2017-10-05 NOTE — Progress Notes (Signed)
Subjective:    Patient ID: Sonya Spencer, female    DOB: 1986/07/11, 32 y.o.   MRN: [id number]  Sonya Spencer is a 32 y.o. female presenting on 10/05/2017 for Anxiety   HPI Anxiety No current concerns with anxiety.  She started escitalopram 20 mg once daily and is currently tolerating it well without side effects.  Pt reports she still has a hard time sleeping w/ nighttime awakenings.  She reports being awake for 5-10 minutes sometimes and others about 1 hour prior to returning to sleep.  Much less irritable. - She is working to improve her sleep hygiene.  Pt reports she iss turning off electronic screens sooner, listening to audiobooks at bedtime.  Limits caffeine. - She is noting some weight gain, but also reports dietary indiscretions around the holidays.  GAD 7 : Generalized Anxiety Score 10/05/2017 06/22/2017 05/11/2017  Nervous, Anxious, on Edge 1 1 3   Control/stop worrying 0 2 2  Worry too much - different things 0 1 2  Trouble relaxing 1 1 3   Restless 1 0 0  Easily annoyed or irritable 1 1 3   Afraid - awful might happen 0 2 2  Total GAD 7 Score 4 8 15   Anxiety Difficulty Somewhat difficult Somewhat difficult Extremely difficult    Depression screen [facility] 2/9 10/05/2017 06/22/2017 05/11/2017  Decreased Interest 1 1 2   Down, Depressed, Hopeless 1 1 2   PHQ - 2 Score 2 2 4   Altered sleeping 2 2 3   Tired, decreased energy 1 1 3   Change in appetite 2 2 3   Feeling bad or failure about yourself  1 1 2   Trouble concentrating 1 1 2   Moving slowly or fidgety/restless 0 0 0  Suicidal thoughts 0 0 1  PHQ-9 Score 9 9 18   Difficult doing work/chores Somewhat difficult Somewhat difficult -    Social History   Tobacco Use  . Smoking status: Former Smoker    Packs/day: 0.25    Years: 2.00    Pack years: 0.50    Types: Cigarettes    Last attempt to quit: 03/13/2009    Years since quitting: 8.6  . Smokeless tobacco: Never Used  Substance Use Topics  . Alcohol use: Yes  . Drug use:  No    Review of Systems Per HPI unless specifically indicated above     Objective:    BP 119/72 (BP Location: Right Arm, Patient Position: Sitting, Cuff Size: Normal)   Pulse 75   Temp 98.6 F (37 C) (Oral)   Resp 16   Ht 5' 2.75" (1.594 m)   Wt 210 lb 9.6 oz (95.5 kg)   BMI 37.60 kg/m   Wt Readings from Last 3 Encounters:  10/05/17 210 lb 9.6 oz (95.5 kg)  06/22/17 198 lb 6.4 oz (90 kg)  05/11/17 195 lb (88.5 kg)    Physical Exam  General - overweight, well-appearing, NAD HEENT - Normocephalic, atraumatic Neck - supple, non-tender, no LAD Heart - RRR, normal S1/S2, no murmurs heard Lungs - Clear throughout all lobes, no wheezing, crackles, or rhonchi. Normal work of breathing. Extremeties - non-tender, no edema, cap refill < 2 seconds, peripheral pulses intact +2 bilaterally Skin - warm, dry Neuro - awake, alert, oriented x3, normal gait Psych - Normal mood and affect, normal behavior, appropriate conversation in clinic today   Results for orders placed or performed during the hospital encounter of 03/22/15  OB RESULT CONSOLE Group B Strep  Result Value Ref Range   GBS  Positive   HIV antibody  Result Value Ref Range   HIV Screen 4th Generation wRfx Non Reactive Non Reactive  CBC  Result Value Ref Range   WBC 11.4 (H) 3.6 - 11.0 K/uL   RBC 4.03 3.80 - 5.20 MIL/uL   Hemoglobin 9.4 (L) 12.0 - 16.0 g/dL   HCT 30.1 (L) 35.0 - 47.0 %   MCV 74.7 (L) 80.0 - 100.0 fL   MCH 23.4 (L) 26.0 - 34.0 pg   MCHC 31.4 (L) 32.0 - 36.0 g/dL   RDW 17.9 (H) 11.5 - 14.5 %   Platelets 272 150 - 440 K/uL  RPR  Result Value Ref Range   RPR Ser Ql Non Reactive Non Reactive  OB RESULTS CONSOLE GC/Chlamydia  Result Value Ref Range   Gonorrhea Negative    Chlamydia Negative   OB RESULTS CONSOLE RPR  Result Value Ref Range   RPR Nonreactive   OB RESULTS CONSOLE HIV antibody  Result Value Ref Range   HIV Non-reactive   OB RESULTS CONSOLE Rubella Antibody  Result Value Ref Range    Rubella Immune   OB RESULTS CONSOLE Varicella zoster antibody, IgG  Result Value Ref Range   Varicella Immune   OB RESULTS CONSOLE Hepatitis B surface antigen  Result Value Ref Range   Hepatitis B Surface Ag Negative   CBC  Result Value Ref Range   WBC 15.9 (H) 3.6 - 11.0 K/uL   RBC 3.62 (L) 3.80 - 5.20 MIL/uL   Hemoglobin 8.6 (L) 12.0 - 16.0 g/dL   HCT 26.7 (L) 35.0 - 47.0 %   MCV 73.8 (L) 80.0 - 100.0 fL   MCH 23.8 (L) 26.0 - 34.0 pg   MCHC 32.2 32.0 - 36.0 g/dL   RDW 17.4 (H) 11.5 - 14.5 %   Platelets 219 150 - 440 K/uL  Type and screen  Result Value Ref Range   ABO/RH(D) A POS    Antibody Screen NEG    Sample Expiration 03/25/2015   OB RESULTS CONSOLE ABO/Rh  Result Value Ref Range   RH Type  Positive    ABO Grouping A   ABO/Rh  Result Value Ref Range   ABO/RH(D) A POS       Assessment & Plan:   Problem List Items Addressed This Visit      Other   Anxiety and depression    Pt reports is continuing to improve on medications since last visit and now feels her symptoms are controlled except for sleep disturbance. Continued reduction of GAD7 scores correlates w/ pt report.    Plan: 1. Continue escitalopram to 20 mg once daily as monotherapy.  Consider adding augmentation with trazodone or seroquel if sleep continues to be disrupted and depression remains at moderate.  2. Encouraged self-care for exercise and proper diet, good sleep hygiene. 3. Follow up 6 months or as needed if symptoms change or worsen.      Relevant Medications   escitalopram (LEXAPRO) 20 MG tablet      Meds ordered this encounter  Medications  . escitalopram (LEXAPRO) 20 MG tablet    Sig: Take 1 tablet (20 mg total) by mouth daily.    Dispense:  90 tablet    Refill:  3    Order Specific Question:   Supervising Provider    Answer:   Olin Hauser [[id number]]      Follow up plan: Return in about 6 months (around 04/04/2018) for Annual Physical.  A total of 25 minutes  was spent  face-to-face with this patient. Greater than 50% of this time was spent in counseling and coordination of care with the patient discussing anxiety, current symptoms, sleep hygiene.   Cassell Smiles, DNP, AGPCNP-BC Adult Gerontology Primary Care Nurse Practitioner [facility] 10/19/2017, 9:14 AM

## 2017-10-05 NOTE — Patient Instructions (Addendum)
Sonya Spencer, Thank you for coming in to clinic today.  1. For your anxiety and sleep disruption. Sleep Hygiene Tips  Take medicines only as directed by your health care provider.  Keep regular sleeping and waking hours. Avoid naps.  Keep a sleep diary to help you and your health care provider figure out what could be causing your insomnia. Include:  When you sleep.  When you wake up during the night.  How well you sleep.  How rested you feel the next day.  Any side effects of medicines you are taking.  What you eat and drink.  Make your bedroom a comfortable place where it is easy to fall asleep:  Put up shades or special blackout curtains to block light from outside.  Use a white noise machine to block noise.  Keep the temperature cool.  Exercise regularly as directed by your health care provider. Avoid exercising right before bedtime.  Use relaxation techniques to manage stress. Ask your health care provider to suggest some techniques that may work well for you. These may include:  Breathing exercises.  Routines to release muscle tension.  Visualizing peaceful scenes.  Cut back on alcohol, caffeinated beverages, and cigarettes, especially close to bedtime. These can disrupt your sleep.  Do not overeat or eat spicy foods right before bedtime. This can lead to digestive discomfort that can make it hard for you to sleep.  Limit screen use before bedtime. This includes:  Watching TV.  Using your smartphone, tablet, and computer.  Stick to a routine. This can help you fall asleep faster. Try to do a quiet activity, brush your teeth, and go to bed at the same time each night.  Get out of bed if you are still awake after 15 minutes of trying to sleep. Keep the lights down, but try reading or doing a quiet activity. When you feel sleepy, go back to bed.  Make sure that you drive carefully. Avoid driving if you feel very sleepy.  Keep all follow-up appointments as  directed by your health care provider. This is important.  2. Continue escitalopram 20 mg once daily.   Please schedule a follow-up appointment with Cassell Smiles, AGNP. Return in about 6 months (around 04/04/2018) for Annual Physical.  If you have any other questions or concerns, please feel free to call the clinic or send a message through [facility]. You may also schedule an earlier appointment if necessary.  You will receive a survey after today's visit either digitally by e-mail or paper by [employer]. Your experiences and feedback matter to [place].  Please respond so we know how we are doing as we provide care for you.   Cassell Smiles, DNP, AGNP-BC Adult Gerontology Nurse Practitioner [place]

## 2017-10-19 ENCOUNTER — Encounter: Payer: Self-pay | Admitting: Nurse Practitioner

## 2017-10-19 NOTE — Assessment & Plan Note (Signed)
Pt reports is continuing to improve on medications since last visit and now feels her symptoms are controlled except for sleep disturbance. Continued reduction of GAD7 scores correlates w/ pt report.    Plan: 1. Continue escitalopram to 20 mg once daily as monotherapy.  Consider adding augmentation with trazodone or seroquel if sleep continues to be disrupted and depression remains at moderate.  2. Encouraged self-care for exercise and proper diet, good sleep hygiene. 3. Follow up 6 months or as needed if symptoms change or worsen.

## 2017-11-03 ENCOUNTER — Encounter: Payer: Self-pay | Admitting: Nurse Practitioner

## 2017-11-03 ENCOUNTER — Encounter: Payer: Self-pay | Admitting: Maternal Newborn

## 2017-11-03 ENCOUNTER — Other Ambulatory Visit: Payer: Self-pay

## 2017-11-03 ENCOUNTER — Ambulatory Visit (INDEPENDENT_AMBULATORY_CARE_PROVIDER_SITE_OTHER): Payer: Commercial Managed Care - PPO | Admitting: Nurse Practitioner

## 2017-11-03 VITALS — BP 123/68 | HR 79 | Temp 98.6°F | Ht 62.75 in | Wt 198.2 lb

## 2017-11-03 DIAGNOSIS — Z3201 Encounter for pregnancy test, result positive: Secondary | ICD-10-CM | POA: Diagnosis not present

## 2017-11-03 DIAGNOSIS — Z3200 Encounter for pregnancy test, result unknown: Secondary | ICD-10-CM

## 2017-11-03 DIAGNOSIS — R110 Nausea: Secondary | ICD-10-CM

## 2017-11-03 DIAGNOSIS — Z3A01 Less than 8 weeks gestation of pregnancy: Secondary | ICD-10-CM

## 2017-11-03 LAB — POCT URINE PREGNANCY: Preg Test, Ur: POSITIVE — AB

## 2017-11-03 NOTE — Progress Notes (Signed)
Subjective:    Patient ID: Sonya Spencer, female    DOB: 1986-03-10, 32 y.o.   MRN: [id number]  GAL SMOLINSKI is a 32 y.o. female presenting on 11/03/2017 for Nausea (positive pregnancy test x 7 )   HPI Nausea Pos Preg test x 7 at home.  Prefers verification with repeat urine test. Nausea and fatigue were her symptoms of possible pregnancy.  She is drinking pink stork tea, ginger candies for nausea.  Has had morning sickness with previous pregnancies as well. - LMP: 10/03/2017 and is usually 28-32 days cycle.  Follows sexual activity as well.  Pt has calculated her own due date as 07/10/2018 - Started prenatal vitamin the day she found out she was pregnant with home pregnancy test.  Pt has relationship for past pregnancies at [facility].  Social History   Tobacco Use  . Smoking status: Former Smoker    Packs/day: 0.25    Years: 2.00    Pack years: 0.50    Types: Cigarettes    Last attempt to quit: 03/13/2009    Years since quitting: 8.6  . Smokeless tobacco: Never Used  Substance Use Topics  . Alcohol use: Yes  . Drug use: No    Review of Systems Per HPI unless specifically indicated above     Objective:    BP 123/68 (BP Location: Right Arm, Patient Position: Sitting, Cuff Size: Normal)   Pulse 79   Temp 98.6 F (37 C) (Oral)   Ht 5' 2.75" (1.594 m)   Wt 198 lb 3.2 oz (89.9 kg)   BMI 35.39 kg/m   Wt Readings from Last 3 Encounters:  11/03/17 198 lb 3.2 oz (89.9 kg)  10/05/17 210 lb 9.6 oz (95.5 kg)  06/22/17 198 lb 6.4 oz (90 kg)    Physical Exam  General - obese, well-appearing, NAD HEENT - Normocephalic, atraumatic Neck - supple, non-tender, no LAD, no thyromegaly Heart - RRR, no murmurs heard Lungs - Clear throughout all lobes, no wheezing, crackles, or rhonchi. Normal work of breathing. Extremeties - non-tender, no edema, cap refill < 2 seconds, peripheral pulses intact +2 bilaterally Skin - warm, dry Neuro - awake, alert, oriented x3, normal  gait Psych - Normal mood and affect, normal behavior   Results for orders placed or performed during the hospital encounter of 03/22/15  OB RESULT CONSOLE Group B Strep  Result Value Ref Range   GBS Positive   HIV antibody  Result Value Ref Range   HIV Screen 4th Generation wRfx Non Reactive Non Reactive  CBC  Result Value Ref Range   WBC 11.4 (H) 3.6 - 11.0 K/uL   RBC 4.03 3.80 - 5.20 MIL/uL   Hemoglobin 9.4 (L) 12.0 - 16.0 g/dL   HCT 30.1 (L) 35.0 - 47.0 %   MCV 74.7 (L) 80.0 - 100.0 fL   MCH 23.4 (L) 26.0 - 34.0 pg   MCHC 31.4 (L) 32.0 - 36.0 g/dL   RDW 17.9 (H) 11.5 - 14.5 %   Platelets 272 150 - 440 K/uL  RPR  Result Value Ref Range   RPR Ser Ql Non Reactive Non Reactive  OB RESULTS CONSOLE GC/Chlamydia  Result Value Ref Range   Gonorrhea Negative    Chlamydia Negative   OB RESULTS CONSOLE RPR  Result Value Ref Range   RPR Nonreactive   OB RESULTS CONSOLE HIV antibody  Result Value Ref Range   HIV Non-reactive   OB RESULTS CONSOLE Rubella Antibody  Result Value Ref  Range   Rubella Immune   OB RESULTS CONSOLE Varicella zoster antibody, IgG  Result Value Ref Range   Varicella Immune   OB RESULTS CONSOLE Hepatitis B surface antigen  Result Value Ref Range   Hepatitis B Surface Ag Negative   CBC  Result Value Ref Range   WBC 15.9 (H) 3.6 - 11.0 K/uL   RBC 3.62 (L) 3.80 - 5.20 MIL/uL   Hemoglobin 8.6 (L) 12.0 - 16.0 g/dL   HCT 26.7 (L) 35.0 - 47.0 %   MCV 73.8 (L) 80.0 - 100.0 fL   MCH 23.8 (L) 26.0 - 34.0 pg   MCHC 32.2 32.0 - 36.0 g/dL   RDW 17.4 (H) 11.5 - 14.5 %   Platelets 219 150 - 440 K/uL  Type and screen  Result Value Ref Range   ABO/RH(D) A POS    Antibody Screen NEG    Sample Expiration 03/25/2015   OB RESULTS CONSOLE ABO/Rh  Result Value Ref Range   RH Type  Positive    ABO Grouping A   ABO/Rh  Result Value Ref Range   ABO/RH(D) A POS       Assessment & Plan:   Problem List Items Addressed This Visit      Other   Less than [redacted] weeks  gestation of pregnancy - Primary Confirmed pregnancy with gestational age < 4 weeks.  LMP 10/03/2017.  Pt with existing OBGYN relationship at [facility].  Plan: 1. Resume care at [facility] 2. Continue prenatal vitamin and ginger remedies for nausea. 3. Other care will need to be coordinated by OBGYN until delivery.  Make sure to discuss escitalopram with them for safety during pregnancy.    Other Visit Diagnoses    Possible pregnancy, not confirmed       Relevant Orders   POCT urine pregnancy (Completed)        Follow up plan: Return in about 10 months (around 09/06/2018) for annual physical and labs.   Cassell Smiles, DNP, AGPCNP-BC Adult Gerontology Primary Care Nurse Practitioner [facility] 11/03/2017, 10:00 AM

## 2017-11-03 NOTE — Patient Instructions (Addendum)
Sonya Spencer, Thank you for coming in to clinic today.  1. For your pregnancy, establish an appointment with [facility] as soon as possible.   - Continue taking prenatal vitamins once tablet every day.   Please schedule a follow-up appointment with Cassell Smiles, AGNP. Return in about 10 months (around 09/06/2018) for annual physical and labs.    If you have any other questions or concerns, please feel free to call the clinic or send a message through [place]. You may also schedule an earlier appointment if necessary.  You will receive a survey after today's visit either digitally by e-mail or paper by [employer]. Your experiences and feedback matter to [place].  Please respond so we know how we are doing as we provide care for you.   Cassell Smiles, DNP, AGNP-BC Adult Gerontology Nurse Practitioner [place]

## 2017-11-09 ENCOUNTER — Ambulatory Visit (INDEPENDENT_AMBULATORY_CARE_PROVIDER_SITE_OTHER): Payer: Medicaid Other | Admitting: Maternal Newborn

## 2017-11-09 ENCOUNTER — Encounter: Payer: Self-pay | Admitting: Maternal Newborn

## 2017-11-09 VITALS — BP 120/80 | Wt 202.0 lb

## 2017-11-09 DIAGNOSIS — O09299 Supervision of pregnancy with other poor reproductive or obstetric history, unspecified trimester: Secondary | ICD-10-CM | POA: Insufficient documentation

## 2017-11-09 DIAGNOSIS — O219 Vomiting of pregnancy, unspecified: Secondary | ICD-10-CM | POA: Insufficient documentation

## 2017-11-09 DIAGNOSIS — Z3490 Encounter for supervision of normal pregnancy, unspecified, unspecified trimester: Secondary | ICD-10-CM | POA: Insufficient documentation

## 2017-11-09 DIAGNOSIS — Z98891 History of uterine scar from previous surgery: Secondary | ICD-10-CM | POA: Insufficient documentation

## 2017-11-09 DIAGNOSIS — Z0189 Encounter for other specified special examinations: Secondary | ICD-10-CM

## 2017-11-09 DIAGNOSIS — Z6830 Body mass index (BMI) 30.0-30.9, adult: Secondary | ICD-10-CM | POA: Insufficient documentation

## 2017-11-09 DIAGNOSIS — Z6836 Body mass index (BMI) 36.0-36.9, adult: Secondary | ICD-10-CM

## 2017-11-09 DIAGNOSIS — Z3481 Encounter for supervision of other normal pregnancy, first trimester: Secondary | ICD-10-CM | POA: Diagnosis not present

## 2017-11-09 HISTORY — DX: Encounter for supervision of normal pregnancy, unspecified, unspecified trimester: Z34.90

## 2017-11-09 MED ORDER — DOXYLAMINE-PYRIDOXINE 10-10 MG PO TBEC: 2.0000 | DELAYED_RELEASE_TABLET | Freq: Every day | ORAL | 5 refills | Status: DC

## 2017-11-09 NOTE — Patient Instructions (Signed)
First Trimester of Pregnancy The first trimester of pregnancy is from week 1 until the end of week 13 (months 1 through 3). A week after a sperm fertilizes an egg, the egg will implant on the wall of the uterus. This embryo will begin to develop into a baby. Genes from you and your partner will form the baby. The female genes will determine whether the baby will be a boy or a girl. At 6-8 weeks, the eyes and face will be formed, and the heartbeat can be seen on ultrasound. At the end of 12 weeks, all the baby's organs will be formed. Now that you are pregnant, you will want to do everything you can to have a healthy baby. Two of the most important things are to get good prenatal care and to follow your health care provider's instructions. Prenatal care is all the medical care you receive before the baby's birth. This care will help prevent, find, and treat any problems during the pregnancy and childbirth. Body changes during your first trimester Your body goes through many changes during pregnancy. The changes vary from woman to woman.  You may gain or lose a couple of pounds at first.  You may feel sick to your stomach (nauseous) and you may throw up (vomit). If the vomiting is uncontrollable, call your health care provider.  You may tire easily.  You may develop headaches that can be relieved by medicines. All medicines should be approved by your health care provider.  You may urinate more often. Painful urination may mean you have a bladder infection.  You may develop heartburn as a result of your pregnancy.  You may develop constipation because certain hormones are causing the muscles that push stool through your intestines to slow down.  You may develop hemorrhoids or swollen veins (varicose veins).  Your breasts may begin to grow larger and become tender. Your nipples may stick out more, and the tissue that surrounds them (areola) may become darker.  Your gums may bleed and may be  sensitive to brushing and flossing.  Dark spots or blotches (chloasma, mask of pregnancy) may develop on your face. This will likely fade after the baby is born.  Your menstrual periods will stop.  You may have a loss of appetite.  You may develop cravings for certain kinds of food.  You may have changes in your emotions from day to day, such as being excited to be pregnant or being concerned that something may go wrong with the pregnancy and baby.  You may have more vivid and strange dreams.  You may have changes in your hair. These can include thickening of your hair, rapid growth, and changes in texture. Some women also have hair loss during or after pregnancy, or hair that feels dry or thin. Your hair will most likely return to normal after your baby is born.  What to expect at prenatal visits During a routine prenatal visit:  You will be weighed to make sure you and the baby are growing normally.  Your blood pressure will be taken.  Your abdomen will be measured to track your baby's growth.  The fetal heartbeat will be listened to between weeks 10 and 14 of your pregnancy.  Test results from any previous visits will be discussed.  Your health care provider may ask you:  How you are feeling.  If you are feeling the baby move.  If you have had any abnormal symptoms, such as leaking fluid, bleeding, severe headaches,   or abdominal cramping.  If you are using any tobacco products, including cigarettes, chewing tobacco, and electronic cigarettes.  If you have any questions.  Other tests that may be performed during your first trimester include:  Blood tests to find your blood type and to check for the presence of any previous infections. The tests will also be used to check for low iron levels (anemia) and protein on red blood cells (Rh antibodies). Depending on your risk factors, or if you previously had diabetes during pregnancy, you may have tests to check for high blood  sugar that affects pregnant women (gestational diabetes).  Urine tests to check for infections, diabetes, or protein in the urine.  An ultrasound to confirm the proper growth and development of the baby.  Fetal screens for spinal cord problems (spina bifida) and Down syndrome.  HIV (human immunodeficiency virus) testing. Routine prenatal testing includes screening for HIV, unless you choose not to have this test.  You may need other tests to make sure you and the baby are doing well.  Follow these instructions at home: Medicines  Follow your health care provider's instructions regarding medicine use. Specific medicines may be either safe or unsafe to take during pregnancy.  Take a prenatal vitamin that contains at least 600 micrograms (mcg) of folic acid.  If you develop constipation, try taking a stool softener if your health care provider approves. Eating and drinking  Eat a balanced diet that includes fresh fruits and vegetables, whole grains, good sources of protein such as meat, eggs, or tofu, and low-fat dairy. Your health care provider will help you determine the amount of weight gain that is right for you.  Avoid raw meat and uncooked cheese. These carry germs that can cause birth defects in the baby.  Eating four or five small meals rather than three large meals a day may help relieve nausea and vomiting. If you start to feel nauseous, eating a few soda crackers can be helpful. Drinking liquids between meals, instead of during meals, also seems to help ease nausea and vomiting.  Limit foods that are high in fat and processed sugars, such as fried and sweet foods.  To prevent constipation: ? Eat foods that are high in fiber, such as fresh fruits and vegetables, whole grains, and beans. ? Drink enough fluid to keep your urine clear or pale yellow. Activity  Exercise only as directed by your health care provider. Most women can continue their usual exercise routine during  pregnancy. Try to exercise for 30 minutes at least 5 days a week. Exercising will help you: ? Control your weight. ? Stay in shape. ? Be prepared for labor and delivery.  Experiencing pain or cramping in the lower abdomen or lower back is a good sign that you should stop exercising. Check with your health care provider before continuing with normal exercises.  Try to avoid standing for long periods of time. Move your legs often if you must stand in one place for a long time.  Avoid heavy lifting.  Wear low-heeled shoes and practice good posture.  You may continue to have sex unless your health care provider tells you not to. Relieving pain and discomfort  Wear a good support bra to relieve breast tenderness.  Take warm sitz baths to soothe any pain or discomfort caused by hemorrhoids. Use hemorrhoid cream if your health care provider approves.  Rest with your legs elevated if you have leg cramps or low back pain.  If you develop   varicose veins in your legs, wear support hose. Elevate your feet for 15 minutes, 3-4 times a day. Limit salt in your diet. Prenatal care  Schedule your prenatal visits by the twelfth week of pregnancy. They are usually scheduled monthly at first, then more often in the last 2 months before delivery.  Write down your questions. Take them to your prenatal visits.  Keep all your prenatal visits as told by your health care provider. This is important. Safety  Wear your seat belt at all times when driving.  Make a list of emergency phone numbers, including numbers for family, friends, the hospital, and police and fire departments. General instructions  Ask your health care provider for a referral to a local prenatal education class. Begin classes no later than the beginning of month 6 of your pregnancy.  Ask for help if you have counseling or nutritional needs during pregnancy. Your health care provider can offer advice or refer you to specialists for help  with various needs.  Do not use hot tubs, steam rooms, or saunas.  Do not douche or use tampons or scented sanitary pads.  Do not cross your legs for long periods of time.  Avoid cat litter boxes and soil used by cats. These carry germs that can cause birth defects in the baby and possibly loss of the fetus by miscarriage or stillbirth.  Avoid all smoking, herbs, alcohol, and medicines not prescribed by your health care provider. Chemicals in these products affect the formation and growth of the baby.  Do not use any products that contain nicotine or tobacco, such as cigarettes and e-cigarettes. If you need help quitting, ask your health care provider. You may receive counseling support and other resources to help you quit.  Schedule a dentist appointment. At home, brush your teeth with a soft toothbrush and be gentle when you floss. Contact a health care provider if:  You have dizziness.  You have mild pelvic cramps, pelvic pressure, or nagging pain in the abdominal area.  You have persistent nausea, vomiting, or diarrhea.  You have a bad smelling vaginal discharge.  You have pain when you urinate.  You notice increased swelling in your face, hands, legs, or ankles.  You are exposed to fifth disease or chickenpox.  You are exposed to German measles (rubella) and have never had it. Get help right away if:  You have a fever.  You are leaking fluid from your vagina.  You have spotting or bleeding from your vagina.  You have severe abdominal cramping or pain.  You have rapid weight gain or loss.  You vomit blood or material that looks like coffee grounds.  You develop a severe headache.  You have shortness of breath.  You have any kind of trauma, such as from a fall or a car accident. Summary  The first trimester of pregnancy is from week 1 until the end of week 13 (months 1 through 3).  Your body goes through many changes during pregnancy. The changes vary from  woman to woman.  You will have routine prenatal visits. During those visits, your health care provider will examine you, discuss any test results you may have, and talk with you about how you are feeling. This information is not intended to replace advice given to you by your health care provider. Make sure you discuss any questions you have with your health care provider. Document Released: 09/09/2001 Document Revised: 08/27/2016 Document Reviewed: 08/27/2016 Elsevier Interactive Patient Education  2018 Elsevier   Inc.  

## 2017-11-09 NOTE — Progress Notes (Addendum)
11/09/2017   Chief Complaint: Amenorrhea, positive home pregnancy test, desires prenatal care.  Transfer of Care Patient: no  History of Present Illness: Sonya Spencer is a 32 y.o. G3P3003 at [redacted]w[redacted]d based on Patient's last menstrual period on 10/03/2017, with an Estimated Date of Delivery: 07/10/2018 with the above CC.   Her periods were: regular periods every 28-32 days. Her periods usually last 5-7 days. Her period in January was light and lasted for only a day. She thinks she may have conceived prior to that time. She was using no method when she conceived.  She has Positive signs or symptoms of nausea/vomiting of pregnancy. She has Negative signs or symptoms of miscarriage or preterm labor She identifies Negative Zika risk factors for her and her partner On any different medications around the time she conceived/early pregnancy: She is taking Lexapro and Zyrtec. Has continued Lexapro with previous pregnancies and plans to this time. History of varicella: Yes   ROS: A 12-point review of systems was performed and negative, except as stated in the above HPI.  OBGYN History: As per HPI. OB History  Gravida Para Term Preterm AB Living  4 3 3  0 0 3  SAB TAB Ectopic Multiple Live Births  0 0 0 0 3    # Outcome Date GA Lbr Len/2nd Weight Sex Delivery Anes PTL Lv  4 Current           3 Term 03/22/15 [redacted]w[redacted]d  11 lb 7.8 oz (5.211 kg) M CS-LTranv Spinal  LIV  2 Term 06/02/12 [redacted]w[redacted]d  8 lb 14 oz (4.026 kg) F Vag-Spont EPI  LIV     Birth Comments: TEARING SECOND DEGREE  1 Term 01/02/10 [redacted]w[redacted]d  9 lb 11 oz (4.394 kg) F Vag-Vacuum EPI Y LIV     Birth Comments: IOL AT [redacted]W[redacted]D; LARGE BABY, MAVVUM FOR MATERNAL EXHAUSTION AND VARIABLE DECELS WITH FETAL TACHYDARDIA, PARTIAL 3RD DEGREE LACERATION      Any issues with any prior pregnancies: yes, all 3 pregnancies with macrosomia (>4000 g), G3 Cesarean delivery for 5211 g baby. Any prior children are healthy, doing well, without any problems or issues:  yes History of pap smears: Yes. Last pap smear 2015. Declines now but will accept postpartum.  History of STIs: No   Past Medical History: Past Medical History:  Diagnosis Date  . Allergy   . Anemia   . Anxiety   . Cholelithiasis   . Depression   . Fetal macrosomia during pregnancy in third trimester 03/25/2015   Resolved with delivery   . Frequent headaches   . H/O maternal third degree perineal laceration, currently pregnant   . History of Papanicolaou smear of cervix 10/10/11; 08/02/14   neg, ct neg; neg ct//gc/tr neg  . Urinary incontinence     Past Surgical History: Past Surgical History:  Procedure Laterality Date  . CESAREAN SECTION N/A 03/22/2015   Procedure: CESAREAN SECTION;  Surgeon: Will Bonnet, MD;  Location: [facility] ORS;  Service: Obstetrics;  Laterality: N/A;    Family History:  Family History  Problem Relation Age of Onset  . Lupus Mother   . Hepatitis C Mother   . Depression Mother   . Diabetes type I Father   . Diabetes Father        type 1  . Thyroid disease Father   . Lupus Sister   . Lupus Maternal Grandmother   . Diabetes Maternal Grandmother   . Diabetes type I Daughter   . Lupus Maternal Aunt   .  Diabetes Paternal Grandfather        type 1  . Diabetes Paternal Aunt        type 1 - runs in father's side   She denies any female cancers, bleeding or blood clotting disorders.  She denies any history of intellectual disability, birth defects or genetic disorders in her or the FOB's history  Social History:  Social History   Socioeconomic History  . Marital status: Married    Spouse name: Not on file  . Number of children: 3  . Years of education: 67  . Highest education level: Not on file  Social Needs  . Financial resource strain: Not on file  . Food insecurity - worry: Not on file  . Food insecurity - inability: Not on file  . Transportation needs - medical: Not on file  . Transportation needs - non-medical: Not on file   Occupational History  . Occupation: HOMEMAKER  Tobacco Use  . Smoking status: Former Smoker    Packs/day: 0.25    Years: 2.00    Pack years: 0.50    Types: Cigarettes    Last attempt to quit: 03/13/2009    Years since quitting: 8.6  . Smokeless tobacco: Never Used  Substance and Sexual Activity  . Alcohol use: Yes  . Drug use: No  . Sexual activity: Yes  Other Topics Concern  . Not on file  Social History Narrative  . Not on file   Any cats in the household: no Denies history of and current domestic violence.  Allergy: Allergies  Allergen Reactions  . Nickel Rash  . Red Dye Rash    Includes Benadryl as it has red dye in it.    Current Outpatient Medications:  Current Outpatient Medications:  .  cetirizine (ZYRTEC) 10 MG tablet, Take 10 mg by mouth daily., Disp: , Rfl:  .  escitalopram (LEXAPRO) 20 MG tablet, Take 1 tablet (20 mg total) by mouth daily., Disp: 90 tablet, Rfl: 3 .  Multiple Vitamins-Minerals (WOMENS ONE DAILY) TABS, Take by mouth., Disp: , Rfl:  .  Doxylamine-Pyridoxine (DICLEGIS) 10-10 MG TBEC, Take 2 tablets by mouth at bedtime. If symptoms persist, add one tablet in the morning and one in the afternoon, Disp: 100 tablet, Rfl: 5   Physical Exam:   BP 120/80   Wt 202 lb (91.6 kg)   LMP 10/03/2017   BMI 36.07 kg/m  Body mass index is 36.07 kg/m. Constitutional: Well nourished, well developed female in no acute distress.  Neck:  Supple, normal appearance, and no thyromegaly  Cardiovascular: S1, S2 normal, no murmur, rub or gallop, regular rate and rhythm Respiratory:  Clear to auscultation bilaterally. Normal respiratory effort Abdomen: no masses, hernias; diffusely non tender to palpation, non distended Breasts: breasts appear normal, no suspicious masses, no skin or nipple changes or axillary nodes. Lipoma present in left axilla, patient notes that it is not painful and has not changed in size Neuro/Psych:  Normal mood and affect.  Skin:  Warm  and dry.  Lymphatic:  No inguinal lymphadenopathy.   Pelvic exam: is not limited by body habitus External genitalia, Bartholin's glands, Urethra, Skene's glands: within normal limits Vagina: within normal limits and with no blood in the vault  Cervix: normal appearing cervix without discharge or lesions, closed/long/high Uterus:  enlarged Adnexa:  no mass, fullness, tenderness  Assessment: Ms. Narciso is a 32 y.o. G3P3003 at [redacted]w[redacted]d based on Patient's last menstrual period on 10/03/2017, with an Estimated Date of Delivery: 07/10/2018,  presenting for prenatal care.  Plan:  1) Avoid alcoholic beverages. 2) Patient encouraged not to smoke.  3) Discontinue the use of all non-medicinal drugs and chemicals.  4) Take prenatal vitamins daily.  5) Seatbelt use advised 6) Nutrition, food safety (fish, cheese advisories, and high nitrite foods) and exercise discussed. 7) Hospital and practice style delivering at [facility] discussed  8) Patient is asked about travel to areas at risk for the [place] virus, and counseled to avoid travel and exposure to mosquitoes or sexual partners who may have themselves been exposed to the virus. Testing is discussed, and will be ordered as appropriate.  9) Childbirth classes at [facility] advised 10) Genetic Screening, such as with 1st Trimester Screening, cell free fetal DNA, AFP testing, and Ultrasound, as well as with amniocentesis and CVS as appropriate, is discussed with patient. She plans to decline genetic testing this pregnancy. 11) BMI>30, early GTT with NOB labs next visit. 12) Dating and viability ultrasound ordered. 63) Gave sample of Bonjesta and sent Rx for Diclegis as patient cannot order from specialty pharmacy.  Problem list reviewed and updated.  Avel Sensor, CNM [facility] 11/09/2017  8:51 PM

## 2017-11-09 NOTE — Progress Notes (Signed)
C/O thinks she may be further along, concerned about delivery after c/s, concerned about another big baby.rj

## 2017-11-11 LAB — URINE CULTURE

## 2017-11-11 LAB — GC/CHLAMYDIA PROBE AMP
CHLAMYDIA, DNA PROBE: NEGATIVE
NEISSERIA GONORRHOEAE BY PCR: NEGATIVE

## 2017-11-11 LAB — URINE DRUG PANEL 7
Amphetamines, Urine: NEGATIVE ng/mL
Barbiturate Quant, Ur: NEGATIVE ng/mL
Benzodiazepine Quant, Ur: NEGATIVE ng/mL
Cannabinoid Quant, Ur: NEGATIVE ng/mL
Cocaine (Metab.): NEGATIVE ng/mL
OPIATE QUANT UR: NEGATIVE ng/mL
PCP Quant, Ur: NEGATIVE ng/mL

## 2017-11-16 ENCOUNTER — Ambulatory Visit (INDEPENDENT_AMBULATORY_CARE_PROVIDER_SITE_OTHER): Payer: Medicaid Other | Admitting: Advanced Practice Midwife

## 2017-11-16 ENCOUNTER — Other Ambulatory Visit: Payer: Medicaid Other

## 2017-11-16 ENCOUNTER — Encounter: Payer: Self-pay | Admitting: Advanced Practice Midwife

## 2017-11-16 ENCOUNTER — Ambulatory Visit (INDEPENDENT_AMBULATORY_CARE_PROVIDER_SITE_OTHER): Payer: Medicaid Other

## 2017-11-16 ENCOUNTER — Other Ambulatory Visit: Payer: Self-pay | Admitting: Maternal Newborn

## 2017-11-16 VITALS — BP 122/80 | Wt 201.0 lb

## 2017-11-16 DIAGNOSIS — Z0189 Encounter for other specified special examinations: Secondary | ICD-10-CM

## 2017-11-16 DIAGNOSIS — O0990 Supervision of high risk pregnancy, unspecified, unspecified trimester: Secondary | ICD-10-CM | POA: Insufficient documentation

## 2017-11-16 NOTE — Progress Notes (Signed)
ROB Dating scan 

## 2017-11-16 NOTE — Progress Notes (Signed)
  Routine Prenatal Care Visit  Subjective  Sonya Spencer is a 32 y.o. [phone] at [redacted]w[redacted]d being seen today for ongoing prenatal care.  She is currently monitored for the following issues for this high-risk pregnancy and has Anxiety and depression; Frequent headaches; Lipoma of axilla; Supervision of high risk pregnancy; History of macrosomia in infant in prior pregnancy, currently pregnant; Nausea and vomiting during pregnancy; and History of cesarean delivery on their problem list.  ----------------------------------------------------------------------------------- Patient reports heartburn. She is taking ranitidine PRN. She states that diclegis is helping her nausea.   Denies cramping.  Denies vaginal bleeding.  Denies leaking of fluid.  ----------------------------------------------------------------------------------- The following portions of the patient's history were reviewed and updated as appropriate: allergies, current medications, past family history, past medical history, past social history, past surgical history and problem list. Problem list updated.   Objective  Blood pressure 122/80, weight 201 lb (91.2 kg), last menstrual period 10/03/2017, currently breastfeeding. Pregravid weight 199 lb (90.3 kg) Total Weight Gain 2 lb (0.907 kg) Urinalysis: Urine Protein: Negative Urine Glucose: Negative  Fetal Status: Dating scan today: single yolk sac seen too early to date pregnancy  General:  Alert, oriented and cooperative. Patient is in no acute distress.  Skin: Skin is warm and dry. No rash noted.   Cardiovascular: Normal heart rate noted  Respiratory: Normal respiratory effort, no problems with respiration noted  Abdomen: Soft, gravid, appropriate for gestational age.       Pelvic:  Cervical exam deferred        Extremities: Normal range of motion.     Mental Status: Normal mood and affect. Normal behavior. Normal judgment and thought content.   Assessment   32 y.o. G4P3003  at [redacted]w[redacted]d by  07/10/2018, by Last Menstrual Period presenting for routine prenatal visit  Plan   FOURTH Problems (from 11/09/17 to present)    Problem Noted Resolved   Supervision of normal pregnancy 11/09/2017 by Rexene Agent, CNM No   Overview Signed 11/09/2017 10:00 AM by Rexene Agent, [place] Prenatal Labs  Dating  Blood type:     Genetic Screen 1 Screen:    AFP:     Quad:     NIPS: Antibody:   Anatomic [place]  Rubella:   Varicella:    GTT Early:               Third trimester:  RPR:     Rhogam  HBsAg:     TDaP vaccine                       Flu Shot: HIV:     Baby Food                                GBS:   Contraception  Pap:  CBB     CS/VBAC    Support Person                  Preterm labor symptoms and general obstetric precautions including but not limited to vaginal bleeding, contractions, leaking of fluid and fetal movement were reviewed in detail with the patient. Please refer to After Visit Summary for other counseling recommendations.   Return in about 2 weeks (around 11/30/2017) for viability scan and rob.  Rod Can, CNM  11/16/2017 11:30 AM

## 2017-11-24 ENCOUNTER — Encounter: Payer: Self-pay | Admitting: Maternal Newborn

## 2017-11-30 ENCOUNTER — Ambulatory Visit (INDEPENDENT_AMBULATORY_CARE_PROVIDER_SITE_OTHER): Payer: Medicaid Other

## 2017-11-30 ENCOUNTER — Encounter: Payer: Self-pay | Admitting: Maternal Newborn

## 2017-11-30 ENCOUNTER — Ambulatory Visit: Payer: Medicaid Other

## 2017-11-30 ENCOUNTER — Ambulatory Visit (INDEPENDENT_AMBULATORY_CARE_PROVIDER_SITE_OTHER): Payer: Medicaid Other | Admitting: Maternal Newborn

## 2017-11-30 VITALS — BP 120/80 | Wt 196.0 lb

## 2017-11-30 DIAGNOSIS — Z3481 Encounter for supervision of other normal pregnancy, first trimester: Secondary | ICD-10-CM

## 2017-11-30 DIAGNOSIS — O09299 Supervision of pregnancy with other poor reproductive or obstetric history, unspecified trimester: Secondary | ICD-10-CM

## 2017-11-30 DIAGNOSIS — O0990 Supervision of high risk pregnancy, unspecified, unspecified trimester: Secondary | ICD-10-CM

## 2017-11-30 NOTE — Progress Notes (Signed)
    Routine Prenatal Care Visit  Subjective  Sonya Spencer is a 32 y.o. [phone] at [redacted]w[redacted]d being seen today for ongoing prenatal care.  She is currently monitored for the following issues for this high-risk pregnancy and has Anxiety and depression; Frequent headaches; Lipoma of axilla; History of macrosomia in infant in prior pregnancy, currently pregnant; Nausea and vomiting during pregnancy; History of cesarean delivery; and Supervision of high risk pregnancy, antepartum on their problem list.  ----------------------------------------------------------------------------------- Patient reports backache, fatigue and nausea.   Vag. Bleeding: None.  Denies leaking of fluid.  ----------------------------------------------------------------------------------- The following portions of the patient's history were reviewed and updated as appropriate: allergies, current medications, past family history, past medical history, past social history, past surgical history and problem list. Problem list updated.   Objective  Last menstrual period 10/03/2017, currently breastfeeding. Pregravid weight 199 lb (90.3 kg) Total Weight Gain  (-1.361 kg) Urinalysis:      Fetal Status: Fetal Heart Rate (bpm): Present         General:  Alert, oriented and cooperative. Patient is in no acute distress.  Skin: Skin is warm and dry. No rash noted.   Cardiovascular: Normal heart rate noted  Respiratory: Normal respiratory effort, no problems with respiration noted  Abdomen: Soft, gravid, appropriate for gestational age. Pain/Pressure: Absent     Pelvic:  Cervical exam deferred        Extremities: Normal range of motion.     Mental Status: Normal mood and affect. Normal behavior. Normal judgment and thought content.     Assessment   32 y.o. [id number] at [redacted]w[redacted]d, EDD 07/10/2018 by Last Menstrual Period presenting for routine prenatal visit.  Plan   FOURTH Problems (from 11/09/17 to present)    Problem Noted  Resolved   Supervision of high risk pregnancy, antepartum 11/16/2017 by Rod Can, CNM No   Supervision of normal pregnancy 11/09/2017 by Rexene Agent, CNM 11/16/2017 by Rod Can, CNM   Overview Signed 11/09/2017 10:00 AM by Rexene Agent, [place] Prenatal Labs  Dating  Blood type:     Genetic Screen 1 Screen:    AFP:     Quad:     NIPS: Antibody:   Anatomic [place]  Rubella:   Varicella:    GTT Early:               Third trimester:  RPR:     Rhogam  HBsAg:     TDaP vaccine                       Flu Shot: HIV:     Baby Food                                GBS:   Contraception  Pap:  CBB     CS/VBAC    Support Person               Ultrasound today shows single IUP with GA=dates and FHR 162. A 1.7 cm subchorionic hemorrhage was seen. Counseled about [facility] and bleeding.     Return in about 4 weeks (around 12/28/2017) for ROB.  Avel Sensor, CNM 11/30/2017  11:51 AM

## 2017-11-30 NOTE — Progress Notes (Signed)
C/o still very nauseaus/no vomiting, tired all the time, weight loss.rj

## 2017-12-01 LAB — RPR+RH+ABO+RUB AB+AB SCR+CB...
ANTIBODY SCREEN: NEGATIVE
HIV Screen 4th Generation wRfx: NONREACTIVE
Hematocrit: 38.4 % (ref 34.0–46.6)
Hemoglobin: 13 g/dL (ref 11.1–15.9)
Hepatitis B Surface Ag: NEGATIVE
MCH: 27.8 pg (ref 26.6–33.0)
MCHC: 33.9 g/dL (ref 31.5–35.7)
MCV: 82 fL (ref 79–97)
PLATELETS: 291 10*3/uL (ref 150–379)
RBC: 4.68 x10E6/uL (ref 3.77–5.28)
RDW: 14.9 % (ref 12.3–15.4)
RPR Ser Ql: NONREACTIVE
RUBELLA: 2.29 {index} (ref >0.99)
Rh Factor: POSITIVE
VARICELLA: 348 {index} (ref >165)
WBC: 9.5 10*3/uL (ref 3.4–10.8)

## 2017-12-01 LAB — GLUCOSE, 1 HOUR GESTATIONAL: GESTATIONAL DIABETES SCREEN: 149 mg/dL — AB (ref 65–139)

## 2017-12-02 ENCOUNTER — Telehealth: Payer: Self-pay | Admitting: Maternal Newborn

## 2017-12-02 MED ORDER — OSELTAMIVIR PHOSPHATE 75 MG PO CAPS: 75.0000 mg | ORAL_CAPSULE | Freq: Every day | ORAL | 0 refills | Status: AC

## 2017-12-02 NOTE — Telephone Encounter (Signed)
ERx sent to [facility] for Tamiflu daily fro 10 days as prevenmtion due to exposure risk, and pregnant.  Due to allergy to red dye, monitor yourself for rash.  Let us know if fever or flu sx's.

## 2017-12-02 NOTE — Telephone Encounter (Signed)
Patient is an early ob patient.  Her daughter with type 1 diabetes has been diagnosis with flu A.  The daughter is currently in the hospital and not sure when will be discharged.  Can we call in Tamiflu for the patient just incase she does start to have symptoms?  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  Pharmacy [facility] in [place].

## 2017-12-02 NOTE — Telephone Encounter (Signed)
Pt aware.

## 2017-12-03 ENCOUNTER — Encounter: Payer: Self-pay | Admitting: Maternal Newborn

## 2017-12-04 ENCOUNTER — Other Ambulatory Visit: Payer: Self-pay | Admitting: Maternal Newborn

## 2017-12-04 ENCOUNTER — Encounter: Payer: Self-pay | Admitting: Maternal Newborn

## 2017-12-04 DIAGNOSIS — O0990 Supervision of high risk pregnancy, unspecified, unspecified trimester: Secondary | ICD-10-CM

## 2017-12-04 DIAGNOSIS — R7309 Other abnormal glucose: Secondary | ICD-10-CM

## 2017-12-04 NOTE — Progress Notes (Signed)
Notified patient via MyChart message to schedule 3 hour GTT due to 1 hour result of 149. She is currently sick with the flu so should be scheduled when she is feeling well again.  Avel Sensor, CNM 12/04/2017  4:43 PM

## 2017-12-09 ENCOUNTER — Encounter: Payer: Self-pay | Admitting: Maternal Newborn

## 2017-12-16 ENCOUNTER — Ambulatory Visit (INDEPENDENT_AMBULATORY_CARE_PROVIDER_SITE_OTHER): Payer: Medicaid Other | Admitting: Obstetrics and Gynecology

## 2017-12-16 ENCOUNTER — Encounter: Payer: Self-pay | Admitting: Obstetrics and Gynecology

## 2017-12-16 VITALS — BP 118/72 | Wt 192.0 lb

## 2017-12-16 DIAGNOSIS — O0990 Supervision of high risk pregnancy, unspecified, unspecified trimester: Secondary | ICD-10-CM

## 2017-12-16 DIAGNOSIS — F32A Depression, unspecified: Secondary | ICD-10-CM

## 2017-12-16 DIAGNOSIS — Z3A10 10 weeks gestation of pregnancy: Secondary | ICD-10-CM

## 2017-12-16 DIAGNOSIS — F329 Major depressive disorder, single episode, unspecified: Secondary | ICD-10-CM

## 2017-12-16 DIAGNOSIS — F419 Anxiety disorder, unspecified: Secondary | ICD-10-CM

## 2017-12-16 DIAGNOSIS — Z98891 History of uterine scar from previous surgery: Secondary | ICD-10-CM

## 2017-12-16 NOTE — Progress Notes (Signed)
Routine Prenatal Care Visit  Subjective  Sonya Spencer is a 32 y.o. [phone] at [redacted]w[redacted]d being seen today for ongoing prenatal care.  She is currently monitored for the following issues for this high-risk pregnancy and has Anxiety and depression; Frequent headaches; Lipoma of axilla; History of macrosomia in infant in prior pregnancy, currently pregnant; Nausea and vomiting during pregnancy; History of cesarean delivery; and Supervision of high risk pregnancy, antepartum on their problem list.  ----------------------------------------------------------------------------------- Patient reports small vaginal spotting today. She had uterine cramping yesterday after her 30 year old jumped on her stomach. Contractions: Not present. Vag. Bleeding: None.   . Denies leaking of fluid.  ----------------------------------------------------------------------------------- The following portions of the patient's history were reviewed and updated as appropriate: allergies, current medications, past family history, past medical history, past social history, past surgical history and problem list. Problem list updated.   Objective  Blood pressure 118/72, weight 192 lb (87.1 kg), last menstrual period 10/03/2017, currently breastfeeding. Pregravid weight 199 lb (90.3 kg) Total Weight Gain  (-3.175 kg) Urinalysis: Urine Protein: Negative Urine Glucose: Negative  Fetal Status: Fetal Heart Rate (bpm): 175         General:  Alert, oriented and cooperative. Patient is in no acute distress.  Skin: Skin is warm and dry. No rash noted.   Cardiovascular: Normal heart rate noted  Respiratory: Normal respiratory effort, no problems with respiration noted  Abdomen: Soft, gravid, appropriate for gestational age. Pain/Pressure: Absent     Pelvic:  Cervical exam deferred       No blood seen on speculum exam in vaginal vault. No active bleeding from cervix. Normal discharge.   Extremities: Normal range of motion.       ental Status: Normal mood and affect. Normal behavior. Normal judgment and thought content.     Assessment   32 y.o. [id number] at [redacted]w[redacted]d by  07/10/2018, by Last Menstrual Period presenting for routine prenatal visit  Plan   FOURTH Problems (from 11/09/17 to present)    Problem Noted Resolved   Supervision of high risk pregnancy, antepartum 11/16/2017 by Rod Can, CNM No   Overview Addendum 12/16/2017 10:52 AM by Homero Fellers, MD    Clinic [facility]renatal [facility]  Dating L=7 Blood type: A/Positive/-- (03/04 1111)   Genetic Screen Declines Antibody:Negative (03/04 1111)  Anatomic [place]  Rubella: 2.29 (03/04 1111) Varicella: Immune  GTT Early:  149, 3 hour:             Third trimester:  RPR: Non Reactive (03/04 1111)   Rhogam  not applicable HBsAg: Negative (03/04 1111)   TDaP vaccine                        Flu Shot: 05/19/2017 HIV: Non Reactive (03/04 1111)   Baby Food                                GBS:   Contraception  Pap: Needs   CBB     CS/VBAC  Desires VBAC   Support Person                             Gestational age appropriate obstetric precautions including but not limited to vaginal bleeding, contractions, leaking of fluid and fetal movement were reviewed in detail with the patient.    No vaginal bleeding on exam, normal  viable IUP on bedside US. Reassurance given.  Declines pap smear today.  Elevated 1GTT, patient declines 3hr GTT. She does not want to go that long without eating because it makes her nauseous. She would like to just track her glucose and be treated if elevated glucose values. Given glucose log, will come back next week to review and initiate insulin therapy if needed.  Has meter and test strips at home from her daughter who has a lot of supplies because of her type 1 diabetes, patient declines her own supplies at this time.  Lifestyles referral, today. MFM referral if blood glucose are elevated next week.  Patient desires a VBAC, her last  pregnancy was complicated by a LGA infant weight 11lbs 7 oz.   Return in about 1 week (around 12/23/2017) for [place].  Adrian Prows MD [facility], [facility] 12/16/2017, 12:08 PM

## 2017-12-16 NOTE — Progress Notes (Signed)
Pt c/o n/v. Also 30 year old fell on her stomach yesterday and c/o cramping yesterday and today with spotting that started this morning.

## 2017-12-17 ENCOUNTER — Encounter: Payer: Self-pay | Admitting: Obstetrics and Gynecology

## 2017-12-18 ENCOUNTER — Encounter: Payer: Self-pay | Admitting: Advanced Practice Midwife

## 2017-12-23 ENCOUNTER — Other Ambulatory Visit: Payer: Self-pay | Admitting: Advanced Practice Midwife

## 2017-12-23 ENCOUNTER — Encounter: Payer: Self-pay | Admitting: Obstetrics and Gynecology

## 2017-12-23 ENCOUNTER — Ambulatory Visit (INDEPENDENT_AMBULATORY_CARE_PROVIDER_SITE_OTHER): Payer: Medicaid Other | Admitting: Obstetrics and Gynecology

## 2017-12-23 VITALS — BP 120/74 | Wt 186.0 lb

## 2017-12-23 DIAGNOSIS — O0990 Supervision of high risk pregnancy, unspecified, unspecified trimester: Secondary | ICD-10-CM

## 2017-12-23 DIAGNOSIS — O09299 Supervision of pregnancy with other poor reproductive or obstetric history, unspecified trimester: Secondary | ICD-10-CM

## 2017-12-23 DIAGNOSIS — O99810 Abnormal glucose complicating pregnancy: Secondary | ICD-10-CM | POA: Insufficient documentation

## 2017-12-23 DIAGNOSIS — Z3A11 11 weeks gestation of pregnancy: Secondary | ICD-10-CM

## 2017-12-23 DIAGNOSIS — Z1379 Encounter for other screening for genetic and chromosomal anomalies: Secondary | ICD-10-CM

## 2017-12-23 DIAGNOSIS — Z98891 History of uterine scar from previous surgery: Secondary | ICD-10-CM

## 2017-12-23 HISTORY — DX: Abnormal glucose complicating pregnancy: O99.810

## 2017-12-23 NOTE — Progress Notes (Incomplete)
Routine Prenatal Care Visit  Subjective  Sonya Spencer is a 32 y.o. [phone] at [redacted]w[redacted]d being seen today for ongoing prenatal care.  She is currently monitored for the following issues for this {Blank single:19197::"high-risk","low-risk"} pregnancy and has Anxiety and depression; Frequent headaches; Lipoma of axilla; History of macrosomia in infant in prior pregnancy, currently pregnant; Nausea and vomiting during pregnancy; History of cesarean delivery; Supervision of high risk pregnancy, antepartum; and Abnormal glucose tolerance test (GTT) during pregnancy, antepartum on their problem list.  ----------------------------------------------------------------------------------- Patient reports {sx:[id number]}.   Contractions: Not present. Vag. Bleeding: None.  Movement: Absent. Denies leaking of fluid.  ----------------------------------------------------------------------------------- The following portions of the patient's history were reviewed and updated as appropriate: allergies, current medications, past family history, past medical history, past social history, past surgical history and problem list. Problem list updated.   Objective  Blood pressure 120/74, weight 186 lb (84.4 kg), last menstrual period 10/03/2017, currently breastfeeding. Pregravid weight 199 lb (90.3 kg) Total Weight Gain -13 lb (-5.897 kg) Urinalysis: Urine Protein: Negative Urine Glucose: Negative  Fetal Status: Fetal Heart Rate (bpm): 165   Movement: Absent     General:  Alert, oriented and cooperative. Patient is in no acute distress.  Skin: Skin is warm and dry. No rash noted.   Cardiovascular: Normal heart rate noted  Respiratory: Normal respiratory effort, no problems with respiration noted  Abdomen: Soft, gravid, appropriate for gestational age. Pain/Pressure: Absent     Pelvic:  {Blank single:19197::"Cervical exam performed","Cervical exam deferred"}        Extremities: Normal range of motion.     Mental  Status: Normal mood and affect. Normal behavior. Normal judgment and thought content.   Assessment   32 y.o. [id number] at [redacted]w[redacted]d by  07/10/2018, by Last Menstrual Period presenting for {Blank single:19197::"routine","work-in"} prenatal visit  Plan   FOURTH Problems (from 11/09/17 to present)    Problem Noted Resolved   Abnormal glucose tolerance test (GTT) during pregnancy, antepartum 12/23/2017 by Will Bonnet, MD No   Overview Signed 12/23/2017  9:13 AM by Will Bonnet, MD    - pt check BG log.   - Lifestyles referral for presumptive GDM (also due to inordinate weight loss early in pregnancy) - will treat as GDM.  Patient has refused 3 hour gtt.        Supervision of high risk pregnancy, antepartum 11/16/2017 by Rod Can, CNM No   Overview Addendum 12/16/2017 12:06 PM by Homero Fellers, MD    [facility]  Dating L=7 Blood type: A/Positive/-- (03/04 1111)   Genetic Screen Declines Antibody:Negative (03/04 1111)  Anatomic [place]  Rubella: 2.29 (03/04 1111) Varicella: Immune  GTT Early:149, 3 hr: DECLINED   Third trimester:  RPR: Non Reactive (03/04 1111)   Rhogam  not applicable HBsAg: Negative (03/04 1111)   TDaP vaccine                        Flu Shot: 05/19/2017 HIV: Non Reactive (03/04 1111)   Baby Food                                GBS:   Contraception  Pap: Needs   CBB     CS/VBAC  Desires VBAC   Support Person              Supervision of normal pregnancy 11/09/2017 by Rexene Agent, CNM 11/16/2017 by  Rod Can, CNM   Overview Signed 11/09/2017 10:00 AM by Rexene Agent, [facility] Prenatal Labs  Dating  Blood type:     Genetic Screen 1 Screen:    AFP:     Quad:     NIPS: Antibody:   Anatomic [place]  Rubella:   Varicella:    GTT Early:               Third trimester:  RPR:     Rhogam  HBsAg:     TDaP vaccine                       Flu Shot: HIV:     Baby Food                                GBS:   Contraception  Pap:   CBB     CS/VBAC    Support Person                  {Blank single:19197::"Term","Preterm"} labor symptoms and general obstetric precautions including but not limited to vaginal bleeding, contractions, leaking of fluid and fetal movement were reviewed in detail with the patient. Please refer to After Visit Summary for other counseling recommendations.   Return in 5 days (on 12/28/2017) for add u/s for NT to appt on 4/1. may need to move appt slightly to accomodate u/s.  Prentice Docker, MD, [facility], [facility] 12/23/2017 9:13 AM

## 2017-12-23 NOTE — Progress Notes (Signed)
Pt does not have much of an appetite. No vb. No lof.

## 2017-12-23 NOTE — Progress Notes (Signed)
Routine Prenatal Care Visit  Subjective  Sonya Spencer is a 32 y.o. [phone] at [redacted]w[redacted]d being seen today for ongoing prenatal care.  She is currently monitored for the following issues for this high-risk pregnancy and has Anxiety and depression; Frequent headaches; Lipoma of axilla; History of macrosomia in infant in prior pregnancy, currently pregnant; Nausea and vomiting during pregnancy; History of cesarean delivery; Supervision of high risk pregnancy, antepartum; and Abnormal glucose tolerance test (GTT) during pregnancy, antepartum on their problem list.  ----------------------------------------------------------------------------------- Patient reports continued weight loss.  Is tolerating a more-or-less normal diet and drinking plenty of liquid. Contractions: Not present. Vag. Bleeding: None.  Movement: Absent. Denies leaking of fluid.  - elevated 1 h gtt: all values but 1 were normal.  She had a fasting yesterday that was 99.   ----------------------------------------------------------------------------------- The following portions of the patient's history were reviewed and updated as appropriate: allergies, current medications, past family history, past medical history, past social history, past surgical history and problem list. Problem list updated.   Objective  Blood pressure 120/74, weight 186 lb (84.4 kg), last menstrual period 10/03/2017, currently breastfeeding. Pregravid weight 199 lb (90.3 kg) Total Weight Gain -13 lb (-5.897 kg) Urinalysis: Urine Protein: Negative Urine Glucose: Negative  Fetal Status: Fetal Heart Rate (bpm): 165   Movement: Absent     POCT BG: 113 mg/dL  General:  Alert, oriented and cooperative. Patient is in no acute distress.  Skin: Skin is warm and dry. No rash noted.   Cardiovascular: Normal heart rate noted  Respiratory: Normal respiratory effort, no problems with respiration noted  Abdomen: Soft, gravid, appropriate for gestational age.  Pain/Pressure: Absent     Pelvic:  Cervical exam deferred        Extremities: Normal range of motion.     Mental Status: Normal mood and affect. Normal behavior. Normal judgment and thought content.   Assessment   32 y.o. [id number] at [redacted]w[redacted]d by  07/10/2018, by Last Menstrual Period presenting for routine prenatal visit  Plan   FOURTH Problems (from 11/09/17 to present)    Problem Noted Resolved   Abnormal glucose tolerance test (GTT) during pregnancy, antepartum 12/23/2017 by Will Bonnet, MD No   Overview Signed 12/23/2017  9:13 AM by Will Bonnet, MD    - pt check BG log.   - Lifestyles referral for presumptive GDM (also due to inordinate weight loss early in pregnancy) - will treat as GDM.  Patient has refused 3 hour gtt.        Supervision of high risk pregnancy, antepartum 11/16/2017 by Rod Can, CNM No   Overview Addendum 12/16/2017 12:06 PM by Homero Fellers, MD    [facility]  Dating L=7 Blood type: A/Positive/-- (03/04 1111)   Genetic Screen Declines Antibody:Negative (03/04 1111)  Anatomic [place]  Rubella: 2.29 (03/04 1111) Varicella: Immune  GTT Early:149, 3 hr: DECLINED   Third trimester:  RPR: Non Reactive (03/04 1111)   Rhogam  not applicable HBsAg: Negative (03/04 1111)   TDaP vaccine                        Flu Shot: 05/19/2017 HIV: Non Reactive (03/04 1111)   Baby Food                                GBS:   Contraception  Pap: Needs   CBB  CS/VBAC  Desires VBAC   Support Person              Supervision of normal pregnancy 11/09/2017 by Rexene Agent, CNM 11/16/2017 by Rod Can, CNM   Overview Signed 11/09/2017 10:00 AM by Rexene Agent, [place] Prenatal Labs  Dating  Blood type:     Genetic Screen 1 Screen:    AFP:     Quad:     NIPS: Antibody:   Anatomic [place]  Rubella:   Varicella:    GTT Early:               Third trimester:  RPR:     Rhogam  HBsAg:     TDaP vaccine                       Flu  Shot: HIV:     Baby Food                                GBS:   Contraception  Pap:  CBB     CS/VBAC    Support Person                  Preterm labor symptoms and general obstetric precautions including but not limited to vaginal bleeding, contractions, leaking of fluid and fetal movement were reviewed in detail with the patient. Please refer to After Visit Summary for other counseling recommendations.   - Referral to [facility] at [facility] for diabetes instruction and nutrition management.  Also, to discuss weight loss early in pregnancy (according to her, she has lost nearly 30 pounds).   Return in 5 days (on 12/28/2017) for add u/s for NT to appt on 4/1. may need to move appt slightly to accomodate u/s.  Prentice Docker, MD, [facility], [facility] 12/23/2017 9:13 AM

## 2017-12-24 ENCOUNTER — Encounter: Payer: Self-pay | Admitting: Advanced Practice Midwife

## 2017-12-28 ENCOUNTER — Encounter: Payer: Self-pay | Admitting: *Deleted

## 2017-12-28 ENCOUNTER — Encounter: Payer: Medicaid Other | Admitting: Maternal Newborn

## 2017-12-28 ENCOUNTER — Encounter: Payer: Commercial Managed Care - PPO | Attending: Obstetrics and Gynecology | Admitting: *Deleted

## 2017-12-28 ENCOUNTER — Encounter: Payer: Self-pay | Admitting: Advanced Practice Midwife

## 2017-12-28 VITALS — BP 102/70 | Ht 62.0 in | Wt 191.3 lb

## 2017-12-28 DIAGNOSIS — O99810 Abnormal glucose complicating pregnancy: Secondary | ICD-10-CM | POA: Insufficient documentation

## 2017-12-28 DIAGNOSIS — O0990 Supervision of high risk pregnancy, unspecified, unspecified trimester: Secondary | ICD-10-CM | POA: Insufficient documentation

## 2017-12-28 DIAGNOSIS — O24410 Gestational diabetes mellitus in pregnancy, diet controlled: Secondary | ICD-10-CM

## 2017-12-28 DIAGNOSIS — O09299 Supervision of pregnancy with other poor reproductive or obstetric history, unspecified trimester: Secondary | ICD-10-CM | POA: Insufficient documentation

## 2017-12-28 NOTE — Patient Instructions (Signed)
Read booklet on Gestational Diabetes Follow Gestational Meal Planning Guidelines Complete a 3 Day Food Record and bring to next appointment Check blood sugars 4 x day - before breakfast and 2 hrs after every meal and record  Bring blood sugar log to all appointments Purchase urine ketone strips if blood sugars not controlled and check urine ketones every am:  If + increase bedtime snack to 1 protein and 2 carbohydrate servings Walk 20-30 minutes at least 5 x week if permitted by MD

## 2017-12-28 NOTE — Progress Notes (Signed)
Diabetes Self-Management Education  Visit Type: First/Initial  Appt. Start Time: 0900 Appt. End Time: 1010  12/28/2017  Sonya Spencer, identified by name and date of birth, is a 32 y.o. female with a diagnosis of Diabetes: Gestational Diabetes.   ASSESSMENT  Blood pressure 102/70, height 5\' 2"  (1.575 m), weight 191 lb 4.8 oz (86.8 kg), last menstrual period 10/03/2017, currently breastfeeding. Body mass index is 34.99 kg/m.  Diabetes Self-Management Education - 12/28/17 1139      Visit Information   Visit Type  First/Initial      Initial Visit   Diabetes Type  Gestational Diabetes    Are you currently following a meal plan?  Yes    What type of meal plan do you follow?  "not many carbs, can't eat meat or dairy right now"    Are you taking your medications as prescribed?  Yes    Date Diagnosed  March 2019      Health Coping   How would you rate your overall health?  Fair      Psychosocial Assessment   Patient Belief/Attitude about Diabetes  Other (comment) "ok" Her 74 year old daughter has Type 1.    Self-care barriers  None    Self-management support  Doctor's office;Family    Patient Concerns  Nutrition/Meal planning;Healthy Lifestyle    Special Needs  None    Preferred Learning Style  Auditory;Visual;Hands on    Angel Fire in progress    How often do you need to have someone help you when you read instructions, pamphlets, or other written materials from your doctor or pharmacy?  1 - Never      Pre-Education Assessment   Patient understands the diabetes disease and treatment process.  Demonstrates understanding / competency    Patient understands incorporating nutritional management into lifestyle.  Needs Review    Patient undertands incorporating physical activity into lifestyle.  Needs Review    Patient understands using medications safely.  Demonstrates understanding / competency    Patient understands monitoring blood glucose, interpreting and  using results  Demonstrates understanding / competency    Patient understands prevention, detection, and treatment of acute complications.  Demonstrates understanding / competency    Patient understands prevention, detection, and treatment of chronic complications.  Needs Review    Patient understands how to develop strategies to address psychosocial issues.  Needs Review    Patient understands how to develop strategies to promote health/change behavior.  Needs Review      Complications   How often do you check your blood sugar?  3-4 times/day    Fasting Blood glucose range (mg/dL)  70-129 FBG's 73-94 mg/dL with 1 reading of 99 m/gdL.     Postprandial Blood glucose range (mg/dL)  70-129 pp's 88-120 mg/dL with 1 reading of 195 mg/dL.     Have you had a dilated eye exam in the past 12 months?  Yes    Have you had a dental exam in the past 12 months?  Yes    Are you checking your feet?  No      Dietary Intake   Breakfast  pt can't eat meat or dairy with this pregnancy - peanut butter toast and milk; vanilla protein shake; [place] yogurt and fruit    Snack (morning)  grapes or other fruits; toast, hummus and pretzels    Lunch  spaghetti, salmon, corn, lasagna    Dinner  toast and hummus; roast beef sandwich; pizza, fruit  Exercise   Exercise Type  Light (walking / raking leaves)    How many days per week to you exercise?  5    How many minutes per day do you exercise?  30    Total minutes per week of exercise  150      Patient Education   Previous Diabetes Education  Yes (please comment) [facility] with her daugther    Disease state   Definition of diabetes, type 1 and 2, and the diagnosis of diabetes;Explored patient's options for treatment of their diabetes    Nutrition management   Role of diet in the treatment of diabetes and the relationship between the three main macronutrients and blood glucose level;Carbohydrate counting;Reviewed blood glucose goals for pre and post meals and how to  evaluate the patients' food intake on their blood glucose level.    Physical activity and exercise   Role of exercise on diabetes management, blood pressure control and cardiac health.    Monitoring  Identified appropriate SMBG and/or A1C goals.;Ketone testing, when, how.    Chronic complications  Relationship between chronic complications and blood glucose control    Psychosocial adjustment  Identified and addressed patients feelings and concerns about diabetes    Preconception care  Pregnancy and GDM  Role of pre-pregnancy blood glucose control on the development of the fetus;Role of family planning for patients with diabetes;Reviewed with patient blood glucose goals with pregnancy      Individualized Goals (developed by patient)   Reducing Risk Lead a healthier lifestyle Become more fit     Outcomes   Expected Outcomes  Demonstrated interest in learning. Expect positive outcomes    Future DMSE  2 wks       Individualized Plan for Diabetes Self-Management Training:   Learning Objective:  Patient will have a greater understanding of diabetes self-management. Patient education plan is to attend individual and/or group sessions per assessed needs and concerns.   Plan:   Patient Instructions  Read booklet on Gestational Diabetes Follow Gestational Meal Planning Guidelines Complete a 3 Day Food Record and bring to next appointment Check blood sugars 4 x day - before breakfast and 2 hrs after every meal and record  Bring blood sugar log to all appointments Purchase urine ketone strips if blood sugars not controlled and check urine ketones every am:  If + increase bedtime snack to 1 protein and 2 carbohydrate servings Walk 20-30 minutes at least 5 x week if permitted by MD  Expected Outcomes:  Demonstrated interest in learning. Expect positive outcomes  Education material provided:  Gestational Booklet Gestational Meal Planning Guidelines Simple Meal Plan Viewed Gestational Diabetes  Video 3 Day Food Record Goals for a Healthy Pregnancy  If problems or questions, patient to contact team via:   Johny Drilling, [place], [place], CDE [phone]  Future DSME appointment: 2 wks  January 12, 2018 with the dietitian

## 2017-12-30 ENCOUNTER — Encounter: Payer: Self-pay | Admitting: Advanced Practice Midwife

## 2017-12-30 ENCOUNTER — Other Ambulatory Visit: Payer: Medicaid Other

## 2017-12-30 ENCOUNTER — Ambulatory Visit (INDEPENDENT_AMBULATORY_CARE_PROVIDER_SITE_OTHER): Payer: Medicaid Other | Admitting: Advanced Practice Midwife

## 2017-12-30 VITALS — BP 114/74 | Wt 190.0 lb

## 2017-12-30 DIAGNOSIS — O0990 Supervision of high risk pregnancy, unspecified, unspecified trimester: Secondary | ICD-10-CM

## 2017-12-30 DIAGNOSIS — R1013 Epigastric pain: Secondary | ICD-10-CM

## 2017-12-30 DIAGNOSIS — R5383 Other fatigue: Secondary | ICD-10-CM

## 2017-12-30 DIAGNOSIS — O219 Vomiting of pregnancy, unspecified: Secondary | ICD-10-CM

## 2017-12-30 MED ORDER — PROMETHAZINE HCL 12.5 MG RE SUPP: 12.5000 mg | Freq: Four times a day (QID) | RECTAL | 0 refills | Status: DC | PRN

## 2017-12-30 NOTE — Patient Instructions (Signed)
Nausea, Adult Nausea is the feeling of an upset stomach or having to vomit. Nausea on its own is not usually a serious concern, but it may be an early sign of a more serious medical problem. As nausea gets worse, it can lead to vomiting. If vomiting develops, or if you are not able to drink enough fluids, you are at risk of becoming dehydrated. Dehydration can make you tired and thirsty, cause you to have a dry mouth, and decrease how often you urinate. Older adults and people with other diseases or a weak immune system are at higher risk for dehydration. The main goals of treating your nausea are:  To limit repeated nausea episodes.  To prevent vomiting and dehydration.  Follow these instructions at home: Follow instructions from your health care provider about how to care for yourself at home. Eating and drinking Follow these recommendations as told by your health care provider:  Take an oral rehydration solution (ORS). This is a drink that is sold at pharmacies and retail stores.  Drink clear fluids in small amounts as you are able. Clear fluids include water, ice chips, diluted fruit juice, and low-calorie sports drinks.  Eat bland, easy-to-digest foods in small amounts as you are able. These foods include bananas, applesauce, rice, lean meats, toast, and crackers.  Avoid drinking fluids that contain a lot of sugar or caffeine, such as energy drinks, sports drinks, and soda.  Avoid alcohol.  Avoid spicy or fatty foods.  General instructions  Drink enough fluid to keep your urine clear or pale yellow.  Wash your hands often. If soap and water are not available, use hand sanitizer.  Make sure that all people in your household wash their hands well and often.  Rest at home while you recover.  Take over-the-counter and prescription medicines only as told by your health care provider.  Breathe slowly and deeply when you feel nauseous.  Watch your condition for any  changes.  Keep all follow-up visits as told by your health care provider. This is important. Contact a health care provider if:  You have a headache.  You have new symptoms.  Your nausea gets worse.  You have a fever.  You feel light-headed or dizzy.  You vomit.  You cannot keep fluids down. Get help right away if:  You have pain in your chest, neck, arm, or jaw.  You feel extremely weak or you faint.  You have vomit that is bright red or looks like coffee grounds.  You have bloody or black stools or stools that look like tar.  You have a severe headache, a stiff neck, or both.  You have severe pain, cramping, or bloating in your abdomen.  You have a rash.  You have difficulty breathing or are breathing very quickly.  Your heart is beating very quickly.  Your skin feels cold and clammy.  You feel confused.  You have pain when you urinate.  You have signs of dehydration, such as: ? Dark urine, very little, or no urine. ? Cracked lips. ? Dry mouth. ? Sunken eyes. ? Sleepiness. ? Weakness. These symptoms may represent a serious problem that is an emergency. Do not wait to see if the symptoms will go away. Get medical help right away. Call your local emergency services (911 in the [place] Do not drive yourself to the hospital. This information is not intended to replace advice given to you by your health care provider. Make sure you discuss any questions you have  with your health care provider. Document Released: 10/23/2004 Document Revised: 02/18/2016 Document Reviewed: 05/22/2015 Elsevier Interactive Patient Education  2018 [employer] of Pregnancy The second trimester is from week 14 through week 27 (months 4 through 6). The second trimester is often a time when you feel your best. Your body has adjusted to being pregnant, and you begin to feel better physically. Usually, morning sickness has lessened or quit completely, you may have more  energy, and you may have an increase in appetite. The second trimester is also a time when the fetus is growing rapidly. At the end of the sixth month, the fetus is about 9 inches long and weighs about 1 pounds. You will likely begin to feel the baby move (quickening) between 16 and 20 weeks of pregnancy. Body changes during your second trimester Your body continues to go through many changes during your second trimester. The changes vary from woman to woman.  Your weight will continue to increase. You will notice your lower abdomen bulging out.  You may begin to get stretch marks on your hips, abdomen, and breasts.  You may develop headaches that can be relieved by medicines. The medicines should be approved by your health care provider.  You may urinate more often because the fetus is pressing on your bladder.  You may develop or continue to have heartburn as a result of your pregnancy.  You may develop constipation because certain hormones are causing the muscles that push waste through your intestines to slow down.  You may develop hemorrhoids or swollen, bulging veins (varicose veins).  You may have back pain. This is caused by: ? Weight gain. ? Pregnancy hormones that are relaxing the joints in your pelvis. ? A shift in weight and the muscles that support your balance.  Your breasts will continue to grow and they will continue to become tender.  Your gums may bleed and may be sensitive to brushing and flossing.  Dark spots or blotches (chloasma, mask of pregnancy) may develop on your face. This will likely fade after the baby is born.  A dark line from your belly button to the pubic area (linea nigra) may appear. This will likely fade after the baby is born.  You may have changes in your hair. These can include thickening of your hair, rapid growth, and changes in texture. Some women also have hair loss during or after pregnancy, or hair that feels dry or thin. Your hair will  most likely return to normal after your baby is born.  What to expect at prenatal visits During a routine prenatal visit:  You will be weighed to make sure you and the fetus are growing normally.  Your blood pressure will be taken.  Your abdomen will be measured to track your baby's growth.  The fetal heartbeat will be listened to.  Any test results from the previous visit will be discussed.  Your health care provider may ask you:  How you are feeling.  If you are feeling the baby move.  If you have had any abnormal symptoms, such as leaking fluid, bleeding, severe headaches, or abdominal cramping.  If you are using any tobacco products, including cigarettes, chewing tobacco, and electronic cigarettes.  If you have any questions.  Other tests that may be performed during your second trimester include:  Blood tests that check for: ? Low iron levels (anemia). ? High blood sugar that affects pregnant women (gestational diabetes) between 60 and 28 weeks. ? Rh  antibodies. This is to check for a protein on red blood cells (Rh factor).  Urine tests to check for infections, diabetes, or protein in the urine.  An ultrasound to confirm the proper growth and development of the baby.  An amniocentesis to check for possible genetic problems.  Fetal screens for spina bifida and Down syndrome.  HIV (human immunodeficiency virus) testing. Routine prenatal testing includes screening for HIV, unless you choose not to have this test.  Follow these instructions at home: Medicines  Follow your health care provider's instructions regarding medicine use. Specific medicines may be either safe or unsafe to take during pregnancy.  Take a prenatal vitamin that contains at least 600 micrograms (mcg) of folic acid.  If you develop constipation, try taking a stool softener if your health care provider approves. Eating and drinking  Eat a balanced diet that includes fresh fruits and  vegetables, whole grains, good sources of protein such as meat, eggs, or tofu, and low-fat dairy. Your health care provider will help you determine the amount of weight gain that is right for you.  Avoid raw meat and uncooked cheese. These carry germs that can cause birth defects in the baby.  If you have low calcium intake from food, talk to your health care provider about whether you should take a daily calcium supplement.  Limit foods that are high in fat and processed sugars, such as fried and sweet foods.  To prevent constipation: ? Drink enough fluid to keep your urine clear or pale yellow. ? Eat foods that are high in fiber, such as fresh fruits and vegetables, whole grains, and beans. Activity  Exercise only as directed by your health care provider. Most women can continue their usual exercise routine during pregnancy. Try to exercise for 30 minutes at least 5 days a week. Stop exercising if you experience uterine contractions.  Avoid heavy lifting, wear low heel shoes, and practice good posture.  A sexual relationship may be continued unless your health care provider directs you otherwise. Relieving pain and discomfort  Wear a good support bra to prevent discomfort from breast tenderness.  Take warm sitz baths to soothe any pain or discomfort caused by hemorrhoids. Use hemorrhoid cream if your health care provider approves.  Rest with your legs elevated if you have leg cramps or low back pain.  If you develop varicose veins, wear support hose. Elevate your feet for 15 minutes, 3-4 times a day. Limit salt in your diet. Prenatal Care  Write down your questions. Take them to your prenatal visits.  Keep all your prenatal visits as told by your health care provider. This is important. Safety  Wear your seat belt at all times when driving.  Make a list of emergency phone numbers, including numbers for family, friends, the hospital, and police and fire departments. General  instructions  Ask your health care provider for a referral to a local prenatal education class. Begin classes no later than the beginning of month 6 of your pregnancy.  Ask for help if you have counseling or nutritional needs during pregnancy. Your health care provider can offer advice or refer you to specialists for help with various needs.  Do not use hot tubs, steam rooms, or saunas.  Do not douche or use tampons or scented sanitary pads.  Do not cross your legs for long periods of time.  Avoid cat litter boxes and soil used by cats. These carry germs that can cause birth defects in the baby and  possibly loss of the fetus by miscarriage or stillbirth.  Avoid all smoking, herbs, alcohol, and unprescribed drugs. Chemicals in these products can affect the formation and growth of the baby.  Do not use any products that contain nicotine or tobacco, such as cigarettes and e-cigarettes. If you need help quitting, ask your health care provider.  Visit your dentist if you have not gone yet during your pregnancy. Use a soft toothbrush to brush your teeth and be gentle when you floss. Contact a health care provider if:  You have dizziness.  You have mild pelvic cramps, pelvic pressure, or nagging pain in the abdominal area.  You have persistent nausea, vomiting, or diarrhea.  You have a bad smelling vaginal discharge.  You have pain when you urinate. Get help right away if:  You have a fever.  You are leaking fluid from your vagina.  You have spotting or bleeding from your vagina.  You have severe abdominal cramping or pain.  You have rapid weight gain or weight loss.  You have shortness of breath with chest pain.  You notice sudden or extreme swelling of your face, hands, ankles, feet, or legs.  You have not felt your baby move in over an hour.  You have severe headaches that do not go away when you take medicine.  You have vision changes. Summary  The second trimester  is from week 14 through week 27 (months 4 through 6). It is also a time when the fetus is growing rapidly.  Your body goes through many changes during pregnancy. The changes vary from woman to woman.  Avoid all smoking, herbs, alcohol, and unprescribed drugs. These chemicals affect the formation and growth your baby.  Do not use any tobacco products, such as cigarettes, chewing tobacco, and e-cigarettes. If you need help quitting, ask your health care provider.  Contact your health care provider if you have any questions. Keep all prenatal visits as told by your health care provider. This is important. This information is not intended to replace advice given to you by your health care provider. Make sure you discuss any questions you have with your health care provider. Document Released: 09/09/2001 Document Revised: 10/21/2016 Document Reviewed: 10/21/2016 Elsevier Interactive Patient Education  [employer].

## 2017-12-30 NOTE — Progress Notes (Signed)
Routine Prenatal Care Visit  Subjective  Sonya Spencer is a 32 y.o. [phone] at [redacted]w[redacted]d being seen today for ongoing prenatal care.  She is currently monitored for the following issues for this high-risk pregnancy and has Anxiety and depression; Frequent headaches; Lipoma of axilla; History of macrosomia in infant in prior pregnancy, currently pregnant; Nausea and vomiting during pregnancy; History of cesarean delivery; Supervision of high risk pregnancy, antepartum; and Abnormal glucose tolerance test (GTT) during pregnancy, antepartum on their problem list.  ----------------------------------------------------------------------------------- Patient reports nausea and vomiting since 3 am. She has been unable to keep anything down. Prior to this episode she was feeling well. She denies viral symptoms or food borne illness.  Ultrasound tech is not available. Patient chooses MaterniT 21 screen today instead of NT scan/1st trimester screen. Contractions: Not present. Vag. Bleeding: None.   . Denies leaking of fluid.  ----------------------------------------------------------------------------------- The following portions of the patient's history were reviewed and updated as appropriate: allergies, current medications, past family history, past medical history, past social history, past surgical history and problem list. Problem list updated.   Objective  Blood pressure 114/74, weight 190 lb (86.2 kg), last menstrual period 10/03/2017, currently breastfeeding. Pregravid weight 199 lb (90.3 kg) Total Weight Gain -9 lb (-4.082 kg) Urinalysis:      Fetal Status: Fetal Heart Rate (bpm): 166         General:  Alert, oriented and cooperative. Patient is in no acute distress.  Skin: Skin is warm and dry. No rash noted.   Cardiovascular: Normal heart rate noted  Respiratory: Normal respiratory effort, no problems with respiration noted  Abdomen: Soft, gravid, appropriate for gestational age.  Pain/Pressure: Absent     Pelvic:  Cervical exam deferred        Extremities: Normal range of motion.     Mental Status: Normal mood and affect. Normal behavior. Normal judgment and thought content.   Assessment   32 y.o. [id number] at 108w4d by  07/10/2018, by Last Menstrual Period presenting for routine prenatal visit  Plan   FOURTH Problems (from 11/09/17 to present)    Problem Noted Resolved   Abnormal glucose tolerance test (GTT) during pregnancy, antepartum 12/23/2017 by Will Bonnet, MD No   Overview Signed 12/23/2017  9:13 AM by Will Bonnet, MD    - pt check BG log.   - Lifestyles referral for presumptive GDM (also due to inordinate weight loss early in pregnancy) - will treat as GDM.  Patient has refused 3 hour gtt.        Supervision of high risk pregnancy, antepartum 11/16/2017 by Rod Can, CNM No   Overview Addendum 12/16/2017 12:06 PM by Homero Fellers, MD    [facility] Prenatal Labs  Dating L=7 Blood type: A/Positive/-- (03/04 1111)   Genetic Screen Declines Antibody:Negative (03/04 1111)  Anatomic [place]  Rubella: 2.29 (03/04 1111) Varicella: Immune  GTT Early:149, 3 hr: DECLINED   Third trimester:  RPR: Non Reactive (03/04 1111)   Rhogam  not applicable HBsAg: Negative (03/04 1111)   TDaP vaccine                        Flu Shot: 05/19/2017 HIV: Non Reactive (03/04 1111)   Baby Food                                GBS:   Contraception  Pap: Needs  CBB     CS/VBAC  Desires VBAC   Support Person              Supervision of normal pregnancy 11/09/2017 by Rexene Agent, CNM 11/16/2017 by Rod Can, CNM   Overview Signed 11/09/2017 10:00 AM by Rexene Agent, [facility] P[facility]atal [facility]  Dating  Blood type:     Genetic Screen 1 Screen:    AFP:     Quad:     NIPS: Antibody:   Anatomic [place]  Rubella:   Varicella:    GTT Early:               Third trimester:  RPR:     Rhogam  HBsAg:     TDaP vaccine                       Flu  Shot: HIV:     Baby Food                                GBS:   Contraception  Pap:  CBB     CS/VBAC Desires VBAC   Support Person Husband [place] today:  MaterniT 21 Hemoglobin Bile acids  Preterm labor symptoms and general obstetric precautions including but not limited to vaginal bleeding, contractions, leaking of fluid and fetal movement were reviewed in detail with the patient. Please refer to After Visit Summary for other counseling recommendations.   Return in about 1 month (around 01/27/2018) for rob.  Rod Can, CNM 12/30/2017 10:27 AM

## 2017-12-30 NOTE — Progress Notes (Signed)
Nausea and vomitting. R/s NT d/t u/s being out today. No vb. No lof.

## 2017-12-31 ENCOUNTER — Encounter: Payer: Self-pay | Admitting: Advanced Practice Midwife

## 2017-12-31 LAB — HEMOGLOBIN: HEMOGLOBIN: 14 g/dL (ref 11.1–15.9)

## 2017-12-31 LAB — BILE ACIDS, TOTAL: BILE ACIDS TOTAL: 9.4 umol/L (ref 4.7–24.5)

## 2018-01-04 LAB — MATERNIT 21 PLUS CORE, BLOOD
CHROMOSOME 13: NEGATIVE
CHROMOSOME 18: NEGATIVE
Chromosome 21: NEGATIVE
Y CHROMOSOME: DETECTED

## 2018-01-06 ENCOUNTER — Telehealth: Payer: Self-pay | Admitting: Advanced Practice Midwife

## 2018-01-06 NOTE — Telephone Encounter (Signed)
Patient is calling for labs results. Please advise. 

## 2018-01-12 ENCOUNTER — Encounter: Payer: Self-pay | Admitting: Dietician

## 2018-01-12 ENCOUNTER — Encounter: Payer: Commercial Managed Care - PPO | Admitting: Dietician

## 2018-01-12 VITALS — BP 116/70 | Ht 62.0 in | Wt 188.1 lb

## 2018-01-12 DIAGNOSIS — O0990 Supervision of high risk pregnancy, unspecified, unspecified trimester: Secondary | ICD-10-CM | POA: Diagnosis not present

## 2018-01-12 DIAGNOSIS — O24410 Gestational diabetes mellitus in pregnancy, diet controlled: Secondary | ICD-10-CM

## 2018-01-12 NOTE — Patient Instructions (Signed)
   Eat small amount of tolerable foods every 2-4 hours during the day.   Allow for sips of regular soda or sports drink as needed.   Try to include a protein source as often as possible -- tofu can be pureed in pasta sauce, smoothies, etc. without affecting taste.   If needed, schedule another brief follow-up with Pam.

## 2018-01-12 NOTE — Progress Notes (Signed)
   Patient did not have BG record at visit, but reports that her BGs are generally within goal range; fasting BGs ranging 80-90, with one reading in 50s (drank Gatorade). Post-meal BGs typically in 120s or below, with one reading of 142.   Patient states she has been unable to eat much in the past 2 weeks due to nausea, currently having difficulty even drinking water. She has lost about 3lbs. in the past 2 weeks.  Patient reports having some headaches recently, and she wonders if she is beginning to have dehydration. Advised her to contact MD if unable to increase fluid intake.   Discussed appropriate eating pattern for sick days, encouraged protein source and small amount of tolerable carbohydrate every 2-4 hours during the day.    Provided 1700kcal meal plan, and discussed importance of not overeating carbohydrate, but allowing for foods that will help with or avoid nausea.  Instructed patient on food safety, including avoidance of Listeriosis, and limiting mercury from fish.  Discussed importance of maintaining healthy lifestyle habits to reduce risk of Type 2 DM as well as Gestational DM with any future pregnancies.  Advised patient to have BG tested ideally annually, as well as prior to attempting future pregnancies (she states this will be her last pregnancy).

## 2018-01-27 ENCOUNTER — Encounter: Payer: Self-pay | Admitting: Certified Nurse Midwife

## 2018-01-27 ENCOUNTER — Ambulatory Visit (INDEPENDENT_AMBULATORY_CARE_PROVIDER_SITE_OTHER): Payer: Medicaid Other | Admitting: Certified Nurse Midwife

## 2018-01-27 VITALS — BP 102/62 | Wt 192.0 lb

## 2018-01-27 DIAGNOSIS — O0990 Supervision of high risk pregnancy, unspecified, unspecified trimester: Secondary | ICD-10-CM

## 2018-01-27 DIAGNOSIS — Z3A16 16 weeks gestation of pregnancy: Secondary | ICD-10-CM

## 2018-01-27 DIAGNOSIS — R7309 Other abnormal glucose: Secondary | ICD-10-CM

## 2018-01-27 NOTE — Progress Notes (Signed)
Pt c/o cramping in lower abdomen. Denies any spotting.

## 2018-01-27 NOTE — Progress Notes (Signed)
HROB at 16wk4d-S: has had some cramping in the evening, no bleeding and no regularity to cramping. No vaginal bleeding Materniti 21 test : neg XY Had elevated 1 hour GTT (149)-declines 3 hour GTT. Treating like GDM Reports FBSs run 69-98 (usually in the 90s), 2hr pp <120. Did not bring log Declines MSAFP FH at U-2. FHT 156 P: anatomy scan and ROB in 4 weeks Bring FSBS log  Dalia Heading, CNM

## 2018-02-03 ENCOUNTER — Encounter: Payer: Self-pay | Admitting: Advanced Practice Midwife

## 2018-02-04 ENCOUNTER — Encounter: Payer: Self-pay | Admitting: Obstetrics and Gynecology

## 2018-02-04 ENCOUNTER — Ambulatory Visit (INDEPENDENT_AMBULATORY_CARE_PROVIDER_SITE_OTHER): Payer: Medicaid Other | Admitting: Obstetrics and Gynecology

## 2018-02-04 VITALS — BP 104/64 | Wt 191.0 lb

## 2018-02-04 DIAGNOSIS — G43519 Persistent migraine aura without cerebral infarction, intractable, without status migrainosus: Secondary | ICD-10-CM

## 2018-02-04 DIAGNOSIS — O24419 Gestational diabetes mellitus in pregnancy, unspecified control: Secondary | ICD-10-CM

## 2018-02-04 DIAGNOSIS — F329 Major depressive disorder, single episode, unspecified: Secondary | ICD-10-CM

## 2018-02-04 DIAGNOSIS — R51 Headache: Secondary | ICD-10-CM

## 2018-02-04 DIAGNOSIS — F419 Anxiety disorder, unspecified: Secondary | ICD-10-CM

## 2018-02-04 DIAGNOSIS — Z3A17 17 weeks gestation of pregnancy: Secondary | ICD-10-CM

## 2018-02-04 DIAGNOSIS — R519 Headache, unspecified: Secondary | ICD-10-CM

## 2018-02-04 MED ORDER — BUTALBITAL-APAP-CAFFEINE 50-325-40 MG PO CAPS: 1.0000 | ORAL_CAPSULE | ORAL | 0 refills | Status: DC | PRN

## 2018-02-04 NOTE — Progress Notes (Signed)
Pt c/o continuous headache and dizzy spells for the past 3 days that make it difficult to focus. Has tried tylenol 500mg  with no relief. Pt also c/o lower abdominal cramping and started spotting bright red when she went to the bath room this morning.

## 2018-02-04 NOTE — Patient Instructions (Signed)

## 2018-02-04 NOTE — Progress Notes (Signed)
Routine Prenatal Care Visit  Subjective  Sonya Spencer is a 32 y.o. [phone] at [redacted]w[redacted]d being seen today for ongoing prenatal care.  She is currently monitored for the following issues for this high-risk pregnancy and has Anxiety and depression; Frequent headaches; Lipoma of axilla; History of macrosomia in infant in prior pregnancy, currently pregnant; Nausea and vomiting during pregnancy; History of cesarean delivery; Supervision of high risk pregnancy, antepartum; and Abnormal glucose tolerance test (GTT) during pregnancy, antepartum on their problem list.  ----------------------------------------------------------------------------------- Patient reports a headache that has been severe and present since Sunday. She reports pain in the front of her head and wrapping around to the back and then radiating down her neck to her shoulders. She is having difficulty seeing with the headache.Some noises and light can make it worse. She has tried taking extra strength tylenol but it has not helped. She is staying hydrated drinking 3-4 large glasses of water a day. She has not had a similar headache before. She denies new stress or anxiety in her life. She reports a small amount of vaginal spotting this am, but it has improved and she declines examination.  Contractions: Not present. Vag. Bleeding: Scant.   . Denies leaking of fluid.  ----------------------------------------------------------------------------------- The following portions of the patient's history were reviewed and updated as appropriate: allergies, current medications, past family history, past medical history, past social history, past surgical history and problem list. Problem list updated.   Objective  Blood pressure 104/64, weight 191 lb (86.6 kg), last menstrual period 10/03/2017, currently breastfeeding. Pregravid weight 199 lb (90.3 kg) Total Weight Gain -8 lb (-3.629 kg) Urinalysis: Urine Protein: Trace Urine Glucose:  Negative  Fetal Status: Fetal Heart Rate (bpm): 155         General:  Alert, oriented and cooperative. Patient is in no acute distress.  Skin: Skin is warm and dry. No rash noted.   Cardiovascular: Normal heart rate noted  Respiratory: Normal respiratory effort, no problems with respiration noted  Abdomen: Soft, gravid, appropriate for gestational age. Pain/Pressure: Absent     Pelvic:  Refused by patient        Extremities: Normal range of motion.     ental Status: Normal mood and affect. Normal behavior. Normal judgment and thought content.   Laying 104/66 p 88 Sitting 110/68 p 78 Standing 110/72 p 90  Assessment   31 y.o. G4P3003 at [redacted]w[redacted]d by  07/10/2018, by Last Menstrual Period presenting for work-in prenatal visit  Plan   FOURTH Problems (from 11/09/17 to present)    Problem Noted Resolved   Abnormal glucose tolerance test (GTT) during pregnancy, antepartum 12/23/2017 by Will Bonnet, MD No   Overview Signed 12/23/2017  9:13 AM by Will Bonnet, MD    - pt check BG log.   - Lifestyles referral for presumptive GDM (also due to inordinate weight loss early in pregnancy) - will treat as GDM.  Patient has refused 3 hour gtt.        Supervision of high risk pregnancy, antepartum 11/16/2017 by Rod Can, CNM No   Overview Addendum 12/16/2017 12:06 PM by Homero Fellers, MD    Clinic [facility] Prenatal Labs  Dating L=7 Blood type: A/Positive/-- (03/04 1111)   Genetic Screen Declines Antibody:Negative (03/04 1111)  Anatomic [place]  Rubella: 2.29 (03/04 1111) Varicella: Immune  GTT Early:149, 3 hr: DECLINED   Third trimester:  RPR: Non Reactive (03/04 1111)   Rhogam  not applicable HBsAg: Negative (03/04 1111)  TDaP vaccine                        Flu Shot: 05/19/2017 HIV: Non Reactive (03/04 1111)   Baby Food                                GBS:   Contraception  Pap: Needs   CBB     CS/VBAC  Desires VBAC   Support Person                              Gestational age appropriate obstetric precautions including but not limited to vaginal bleeding, contractions, leaking of fluid and fetal movement were reviewed in detail with the patient.    Orthostatics normal.  Discussed supportive measures for headache, prescribed Fioricet for short term use.  Patient has had weight loss this pregnancy, her weight is currently stable. She has seen lifestyles in April 2019. She did not bring a glucose log to this visit. I attempted to call after she left the office to inquire, but she did not answer. Have asked [facility] [employer] to call and follow up with patient about her diabetes.   Return for As planned for next appointment.  Adrian Prows MD [facility] 02/04/18 12:42 PM

## 2018-02-05 ENCOUNTER — Other Ambulatory Visit: Payer: Self-pay

## 2018-02-05 ENCOUNTER — Encounter: Payer: Self-pay | Admitting: Obstetrics and Gynecology

## 2018-02-05 DIAGNOSIS — O24419 Gestational diabetes mellitus in pregnancy, unspecified control: Secondary | ICD-10-CM | POA: Insufficient documentation

## 2018-02-05 DIAGNOSIS — G43519 Persistent migraine aura without cerebral infarction, intractable, without status migrainosus: Secondary | ICD-10-CM

## 2018-02-05 HISTORY — DX: Gestational diabetes mellitus in pregnancy, unspecified control: O24.419

## 2018-02-05 MED ORDER — BUTALBITAL-APAP-CAFFEINE 50-325-40 MG PO CAPS: 1.0000 | ORAL_CAPSULE | ORAL | 0 refills | Status: DC | PRN

## 2018-02-08 ENCOUNTER — Encounter: Payer: Self-pay | Admitting: Obstetrics and Gynecology

## 2018-02-09 ENCOUNTER — Ambulatory Visit (INDEPENDENT_AMBULATORY_CARE_PROVIDER_SITE_OTHER): Payer: Medicaid Other | Admitting: Obstetrics and Gynecology

## 2018-02-09 VITALS — BP 102/76 | Wt 192.0 lb

## 2018-02-09 DIAGNOSIS — Z3A18 18 weeks gestation of pregnancy: Secondary | ICD-10-CM

## 2018-02-09 DIAGNOSIS — Z98891 History of uterine scar from previous surgery: Secondary | ICD-10-CM

## 2018-02-09 DIAGNOSIS — O0990 Supervision of high risk pregnancy, unspecified, unspecified trimester: Secondary | ICD-10-CM

## 2018-02-09 NOTE — Progress Notes (Signed)
Discussed scar tissue from prior cesarean section and stretching.  There is no evidence of defect in her prior incision.

## 2018-02-09 NOTE — Progress Notes (Signed)
ROB Past C/S scar feels like ripping open/burning x1 week

## 2018-02-24 ENCOUNTER — Ambulatory Visit (INDEPENDENT_AMBULATORY_CARE_PROVIDER_SITE_OTHER): Payer: Medicaid Other

## 2018-02-24 ENCOUNTER — Encounter: Payer: Self-pay | Admitting: Advanced Practice Midwife

## 2018-02-24 ENCOUNTER — Ambulatory Visit (INDEPENDENT_AMBULATORY_CARE_PROVIDER_SITE_OTHER): Payer: Medicaid Other | Admitting: Advanced Practice Midwife

## 2018-02-24 ENCOUNTER — Encounter

## 2018-02-24 VITALS — BP 110/70 | Wt 194.0 lb

## 2018-02-24 DIAGNOSIS — O0992 Supervision of high risk pregnancy, unspecified, second trimester: Secondary | ICD-10-CM

## 2018-02-24 DIAGNOSIS — O0990 Supervision of high risk pregnancy, unspecified, unspecified trimester: Secondary | ICD-10-CM

## 2018-02-24 DIAGNOSIS — Z0489 Encounter for examination and observation for other specified reasons: Secondary | ICD-10-CM

## 2018-02-24 DIAGNOSIS — Z3A20 20 weeks gestation of pregnancy: Secondary | ICD-10-CM

## 2018-02-24 DIAGNOSIS — IMO0002 Reserved for concepts with insufficient information to code with codable children: Secondary | ICD-10-CM

## 2018-02-24 NOTE — Progress Notes (Signed)
Routine Prenatal Care Visit  Subjective  Sonya Spencer is a 32 y.o. [phone] at [redacted]w[redacted]d being seen today for ongoing prenatal care.  She is currently monitored for the following issues for this high-risk pregnancy and has Anxiety and depression; Frequent headaches; Lipoma of axilla; History of macrosomia in infant in prior pregnancy, currently pregnant; Nausea and vomiting during pregnancy; History of cesarean delivery; Supervision of high risk pregnancy, antepartum; Abnormal glucose tolerance test (GTT) during pregnancy, antepartum; and Gestational diabetes mellitus (GDM) in second trimester on their problem list.  ----------------------------------------------------------------------------------- Patient reports feeling lightheaded, weak with a couple of falls in the past week. She denies injury or vaginal bleeding from the falls. Discussed the importance of hydration and adequate protein intake for blood pressure and blood sugar. Discussed the importance of maintaining blood sugar in normal range to avoid macrosomic baby and other complications of GDM. She admits to checking her fasting BS every morning with values in the 80s to 90. The highest it has been is 93 in the past 3 weeks. She usually checks her PP after lunch and it is usually 120 or less. She does not always check after breakfast or dinner. She admits to generally healthy diet and adequate hydration. Reminded her of the importance of increased activity and to bring her blood sugar log to her next visit. She did not bring the log today.    Contractions: Not present. Vag. Bleeding: None.  Movement: Present. Denies leaking of fluid.  ----------------------------------------------------------------------------------- The following portions of the patient's history were reviewed and updated as appropriate: allergies, current medications, past family history, past medical history, past social history, past surgical history and problem list.  Problem list updated.   Objective  Blood pressure 110/70, weight 194 lb (88 kg), last menstrual period 10/03/2017, currently breastfeeding. Pregravid weight 199 lb (90.3 kg) Total Weight Gain -5 lb (-2.268 kg)   Spot check of hemoglobin: 12.5 Urinalysis: Urine Protein: Trace Urine Glucose: Negative  Fetal Status: Fetal Heart Rate (bpm): 152   Movement: Present     General:  Alert, oriented and cooperative. Patient is in no acute distress.  Skin: Skin is warm and dry. No rash noted.   Cardiovascular: Normal heart rate noted  Respiratory: Normal respiratory effort, no problems with respiration noted  Abdomen: Soft, gravid, appropriate for gestational age. Pain/Pressure: Absent     Pelvic:  Cervical exam deferred        Extremities: Normal range of motion.  Edema: None  Mental Status: Normal mood and affect. Normal behavior. Normal judgment and thought content.   Assessment   32 y.o. G4P3003 at [redacted]w[redacted]d by  07/10/2018, by Last Menstrual Period presenting for routine prenatal visit  Plan   FOURTH Problems (from 11/09/17 to present)    Problem Noted Resolved   Abnormal glucose tolerance test (GTT) during pregnancy, antepartum 12/23/2017 by Will Bonnet, MD No   Overview Signed 12/23/2017  9:13 AM by Will Bonnet, MD    - pt check BG log.   - Lifestyles referral for presumptive GDM (also due to inordinate weight loss early in pregnancy) - will treat as GDM.  Patient has refused 3 hour gtt.        Supervision of high risk pregnancy, antepartum 11/16/2017 by Rod Can, CNM No   Overview Addendum 12/16/2017 12:06 PM by Homero Fellers, MD    [facility]inic [facility] Prenatal Labs  Dating L=7 Blood type: A/Positive/-- (03/04 1111)   Genetic Screen Declines Antibody:Negative (03/04 1111)  Anatomic [place]  Rubella: 2.29 (03/04 1111) Varicella: Immune  GTT Early:149, 3 hr: DECLINED   Third trimester:  RPR: Non Reactive (03/04 1111)   Rhogam  not applicable HBsAg: Negative (03/04  1111)   TDaP vaccine                        Flu Shot: 05/19/2017 HIV: Non Reactive (03/04 1111)   Baby Food                                GBS:   Contraception  Pap: Needs   CBB     CS/VBAC  Desires VBAC   Support Person              Supervision of normal pregnancy 11/09/2017 by Rexene Agent, CNM 11/16/2017 by Rod Can, CNM   Overview Signed 11/09/2017 10:00 AM by Rexene Agent, [facility] Prenatal Labs  Dating  Blood type:     Genetic Screen 1 Screen:    AFP:     Quad:     NIPS: Antibody:   Anatomic [place]  Rubella:   Varicella:    GTT Early:               Third trimester:  RPR:     Rhogam  HBsAg:     TDaP vaccine                       Flu Shot: HIV:     Baby Food                                GBS:   Contraception  Pap:  CBB     CS/VBAC    Support Person                  Preterm labor symptoms and general obstetric precautions including but not limited to vaginal bleeding, contractions, leaking of fluid and fetal movement were reviewed in detail with the patient.   Return in about 1 month (around 03/24/2018) for follow up anatomy and rob.  Rod Can, CNM 02/24/2018 9:39 AM

## 2018-02-24 NOTE — Progress Notes (Signed)
ROB  Feeling weak, some pain/pressure Couple falls in last week

## 2018-03-05 ENCOUNTER — Observation Stay
Admission: EM | Admit: 2018-03-05 | Discharge: 2018-03-05 | Disposition: A | Discharge status: Home / Self Care | Payer: Commercial Managed Care - PPO | Attending: Certified Nurse Midwife | Admitting: Certified Nurse Midwife

## 2018-03-05 ENCOUNTER — Emergency Department
Admission: EM | Admit: 2018-03-05 | Discharge: 2018-03-05 | Disposition: A | Discharge status: Home / Self Care | Payer: Commercial Managed Care - PPO | Source: Home / Self Care | Attending: Emergency Medicine | Admitting: Emergency Medicine

## 2018-03-05 ENCOUNTER — Encounter: Payer: Self-pay | Admitting: *Deleted

## 2018-03-05 ENCOUNTER — Emergency Department: Payer: Commercial Managed Care - PPO

## 2018-03-05 ENCOUNTER — Other Ambulatory Visit: Payer: Self-pay

## 2018-03-05 DIAGNOSIS — O9989 Other specified diseases and conditions complicating pregnancy, childbirth and the puerperium: Secondary | ICD-10-CM

## 2018-03-05 DIAGNOSIS — Y9389 Activity, other specified: Secondary | ICD-10-CM

## 2018-03-05 DIAGNOSIS — O26892 Other specified pregnancy related conditions, second trimester: Secondary | ICD-10-CM | POA: Diagnosis not present

## 2018-03-05 DIAGNOSIS — S4992XA Unspecified injury of left shoulder and upper arm, initial encounter: Secondary | ICD-10-CM | POA: Insufficient documentation

## 2018-03-05 DIAGNOSIS — W19XXXA Unspecified fall, initial encounter: Secondary | ICD-10-CM

## 2018-03-05 DIAGNOSIS — Z3A23 23 weeks gestation of pregnancy: Secondary | ICD-10-CM | POA: Insufficient documentation

## 2018-03-05 DIAGNOSIS — W109XXA Fall (on) (from) unspecified stairs and steps, initial encounter: Secondary | ICD-10-CM | POA: Diagnosis not present

## 2018-03-05 DIAGNOSIS — Y999 Unspecified external cause status: Secondary | ICD-10-CM | POA: Insufficient documentation

## 2018-03-05 DIAGNOSIS — Y929 Unspecified place or not applicable: Secondary | ICD-10-CM

## 2018-03-05 DIAGNOSIS — W108XXA Fall (on) (from) other stairs and steps, initial encounter: Secondary | ICD-10-CM | POA: Insufficient documentation

## 2018-03-05 DIAGNOSIS — Z87891 Personal history of nicotine dependence: Secondary | ICD-10-CM | POA: Insufficient documentation

## 2018-03-05 DIAGNOSIS — Z3A21 21 weeks gestation of pregnancy: Secondary | ICD-10-CM

## 2018-03-05 DIAGNOSIS — M25532 Pain in left wrist: Secondary | ICD-10-CM

## 2018-03-05 DIAGNOSIS — M25512 Pain in left shoulder: Secondary | ICD-10-CM

## 2018-03-05 DIAGNOSIS — M25522 Pain in left elbow: Secondary | ICD-10-CM

## 2018-03-05 DIAGNOSIS — O0990 Supervision of high risk pregnancy, unspecified, unspecified trimester: Secondary | ICD-10-CM

## 2018-03-05 DIAGNOSIS — O99810 Abnormal glucose complicating pregnancy: Secondary | ICD-10-CM

## 2018-03-05 DIAGNOSIS — Z79899 Other long term (current) drug therapy: Secondary | ICD-10-CM | POA: Insufficient documentation

## 2018-03-05 MED ORDER — ACETAMINOPHEN 325 MG PO TABS
650.0000 mg | ORAL_TABLET | Freq: Once | ORAL | Status: AC
  Administered 2018-03-05: 650 mg via ORAL
  Filled 2018-03-05: qty 2

## 2018-03-05 NOTE — ED Triage Notes (Signed)
Left arm pain after trying to catch herself when she fell going up the stairs. Pt [redacted] weeks pregnant, has already been evaluated on labor and delivery floor prior to this visit. Pt alert and oriented X4, active, cooperative, pt in NAD. RR even and unlabored, color WNL.

## 2018-03-05 NOTE — Final Progress Note (Signed)
Physician Final Progress Note  Patient ID: Sonya Spencer MRN: [id number] DOB/AGE: [birth year] 32 y.o.  Admit date: 03/05/2018 Admitting provider: Homero Fellers, MD / [place]  Discharge date: 03/05/2018   Admission Diagnoses: Fall up stairs IUP at 21 wk 6 days   Discharge Diagnoses:  Active Problems:   Fall (on) (from) other stairs and steps, initial encounter  Left shoulder injury No evidence of fetal compromise from fall  Consults: None  Significant Findings/ Diagnostic Studies: 32 year old G4 P3003 with EDC=07/10/2018 presents at 21wk6d to L&D after falling up stairs onto left arm and left upper abdomen. Has not felt the baby move since the fall. No vaginal bleeding or leakage of fluid. Complains of left shoulder  And elbow pain and difficulty abducting shoulder. Prenatal care at [facility] remarkable for an elevated 1 hour GTT =149 (is checking her blood sugars, declined 3 hour GTT-being treated for presumptive GDM) and a history of macrosomia (8#14oz to 11#7.8oz). She had a primary LTCS in 2016 for suspected macrosomic infant (11# 7.8oz).   Exam: General: gravid WF,  patient splinting left arm against her body Vital signs: BP 115/65 (BP Location: Right Arm)   Pulse 76   Temp 98.2 F (36.8 C) (Oral)   Resp 18   LMP 10/03/2017   Left upper extremity: tenderness to palpation of left shoulder and around left elbow. No bruising or swelling noted. Abdomen: soft, left upper quadrant tender to superficial palpation, no bruising or lacerations FHR: 150 Toco: no contractions Ultrasound: cephalic presentation, +FM noted, posterior placenta appears WNL  A: IUP at 21 wk 6 days s/p fall-no evidence of fetal compromise Left shoulder/arm injury s/p fall  P: DC with abruption precautions Taken to ER for further evaluation of left arm injury   Procedures: none  Discharge Condition: stable  Disposition: Discharge disposition: 01-Home or Self  Care       Diet: Regular diet  Discharge Activity: pending ER evaluation Discussed holding onto side rails when going up and down the stairs and taking one step at a time.  Discharge Instructions    Discharge patient   Complete by:  As directed    Discharge disposition:  01-Home or Self Care   Discharge patient date:  03/05/2018     Allergies as of 03/05/2018      Reactions   Nickel Rash   Red Dye Rash   Includes Benadryl as it has red dye in it.      Medication List    TAKE these medications   ACCU-CHEK AVIVA PLUS test strip Generic drug:  glucose blood   cetirizine 10 MG tablet Commonly known as:  ZYRTEC Take 10 mg by mouth daily.   [facility] RANITIDINE PO Take 1 tablet by mouth daily.   docusate sodium 100 MG capsule Commonly known as:  COLACE Take 100 mg by mouth daily.   Doxylamine-Pyridoxine 10-10 MG Tbec Commonly known as:  DICLEGIS Take 2 tablets by mouth at bedtime. If symptoms persist, add one tablet in the morning and one in the afternoon   escitalopram 20 MG tablet Commonly known as:  LEXAPRO Take 1 tablet (20 mg total) by mouth daily.   FIBER-CAPS PO Take 1 capsule by mouth daily.   PRENATAL COMPLETE PO Take 1 tablet by mouth daily.      Follow up at [facility] on 23 March 2018 as scheduled  Total time spent taking care of this patient: 15 minutes  Signed: Dalia Heading 03/05/2018, 2:04 PM

## 2018-03-05 NOTE — ED Notes (Signed)
First Nurse Note: Pt fell and is c/o left arm pain. Pt has been cleared by OBGYN.

## 2018-03-05 NOTE — ED Provider Notes (Signed)
[facility] Emergency Department Provider Note   ____________________________________________   First MD Initiated Contact with Patient 03/05/18 1443     (approximate)  I have reviewed the triage vital signs and the nursing notes.   HISTORY  Chief Complaint Arm Injury    HPI Sonya Spencer is a 32 y.o. female patient complain of left arm pain secondary to a fall.  Patient states she was going up steps when she tripped and fell break and fall with the left wrist.  Patient pain radiates up to her shoulder.  Patient is 2 [redacted] weeks gestation.  Patient has been cleared by labor and delivery prior to coming to the emergency room.  Patient states pain increases with abduction overhead reaching the right shoulder.  Patient also state pain increased with flexion extension of the left wrist.  Patient rates pain as 8/10.  Patient described the pain as "achy".  No palliative measures for complaint.  Past Medical History:  Diagnosis Date  . Allergy   . Anemia   . Anxiety   . Cholelithiasis   . Depression   . Fetal macrosomia during pregnancy in third trimester 03/25/2015   Resolved with delivery   . Frequent headaches   . Gestational diabetes   . H/O maternal third degree perineal laceration, currently pregnant   . History of Papanicolaou smear of cervix 10/10/11; 08/02/14   neg, ct neg; neg ct//gc/tr neg  . Supervision of normal pregnancy 11/09/2017   [facility]inic [facility] Prenatal Labs Dating  Blood type:    Genetic Screen 1 Screen:    AFP:     Quad:     NIPS: Antibody:  Anatomic [place]  Rubella:   Varicella:   GTT Early:               Third trimester:  RPR:    Rhogam  HBsAg:    TDaP vaccine                       Flu Shot: HIV:    Baby Food                                GBS:  Contraception  Pap: CBB    CS/VBAC   Support Person        . Urinary incontinence     Patient Active Problem List   Diagnosis Date Noted  . Fall (on) (from) other stairs and steps, initial encounter  03/05/2018  . Gestational diabetes mellitus (GDM) in second trimester 02/05/2018  . Abnormal glucose tolerance test (GTT) during pregnancy, antepartum 12/23/2017  . Supervision of high risk pregnancy, antepartum 11/16/2017  . History of macrosomia in infant in prior pregnancy, currently pregnant 11/09/2017  . Nausea and vomiting during pregnancy 11/09/2017  . History of cesarean delivery 11/09/2017  . Lipoma of axilla 06/22/2017  . Anxiety and depression 05/11/2017  . Frequent headaches 05/11/2017    Past Surgical History:  Procedure Laterality Date  . CESAREAN SECTION N/A 03/22/2015   Procedure: CESAREAN SECTION;  Surgeon: Will Bonnet, MD;  Location: [facility] ORS;  Service: Obstetrics;  Laterality: N/A;    Prior to Admission medications   Medication Sig Start Date End Date Taking? Authorizing Provider  ACCU-CHEK AVIVA PLUS test strip  01/26/18   [provider]  Calcium Polycarbophil (FIBER-CAPS PO) Take 1 capsule by mouth daily.    [provider]  cetirizine Alethia Berthold)  10 MG tablet Take 10 mg by mouth daily.    [provider]  docusate sodium (COLACE) 100 MG capsule Take 100 mg by mouth daily.    [provider]  Doxylamine-Pyridoxine (DICLEGIS) 10-10 MG TBEC Take 2 tablets by mouth at bedtime. If symptoms persist, add one tablet in the morning and one in the afternoon 11/09/17   Rexene Agent, CNM  escitalopram (LEXAPRO) 20 MG tablet Take 1 tablet (20 mg total) by mouth daily. 10/05/17   Mikey College, NP  Prenatal Vit-Fe Fumarate-FA (PRENATAL COMPLETE PO) Take 1 tablet by mouth daily.    [provider]  raNITIdine HCl ([facility] RANITIDINE PO) Take 1 tablet by mouth daily.    [provider]    Allergies Nickel and Red dye  Family History  Problem Relation Age of Onset  . Lupus Mother   . Hepatitis C Mother   . Depression Mother   . Diabetes type I Father   . Diabetes Father        type 1  . Thyroid disease Father    . Lupus Sister   . Lupus Maternal Grandmother   . Diabetes Maternal Grandmother   . Diabetes type I Daughter   . Diabetes Daughter   . Lupus Maternal Aunt   . Diabetes Paternal Grandfather        type 1  . Diabetes Paternal Aunt        type 1 - runs in father's side    Social History Social History   Tobacco Use  . Smoking status: Former Smoker    Packs/day: 0.25    Years: 2.00    Pack years: 0.50    Types: Cigarettes    Last attempt to quit: 03/13/2009    Years since quitting: 8.9  . Smokeless tobacco: Never Used  Substance Use Topics  . Alcohol use: Not Currently    Comment: not during pregnancy  . Drug use: No    Review of Systems  Constitutional: No fever/chills Eyes: No visual changes. ENT: No sore throat. Cardiovascular: Denies chest pain. Respiratory: Denies shortness of breath. Gastrointestinal: No abdominal pain.  No nausea, no vomiting.  No diarrhea.  No constipation. Genitourinary: Negative for dysuria. Musculoskeletal: Negative for back pain. Skin: Negative for rash. Neurological: Positive for headaches, but denies focal weakness or numbness. Psychiatric:Anxiety and depression Endocrine:Gestational diabetes Allergic/Immunilogical: Nickel and red diet. ____________________________________________   PHYSICAL EXAM:  VITAL SIGNS: ED Triage Vitals [03/05/18 1435]  Enc Vitals Group     BP 122/66     Pulse Rate 72     Resp 18     Temp 97.6 F (36.4 C)     Temp Source Oral     SpO2 99 %     Weight 194 lb (88 kg)     Height 5\' 3"  (1.6 m)     Head Circumference      Peak Flow      Pain Score 8     Pain Loc      Pain Edu?      Excl. in [place]?     Constitutional: Alert and oriented. Well appearing and in no acute distress. Eyes: Conjunctivae are normal. PERRL. EOMI. Head: Atraumatic. Nose: No congestion/rhinnorhea. Mouth/Throat: Mucous membranes are moist.  Oropharynx non-erythematous. Neck: No stridor. No cervical spine tenderness to  palpation. Hematological/Lymphatic/Immunilogical: No cervical lymphadenopathy. Cardiovascular: Normal rate, regular rhythm. Grossly normal heart sounds.  Good peripheral circulation. Respiratory: Normal respiratory effort.  No retractions. Lungs CTAB.  Gastrointestinal: Soft and nontender. No distention. No abdominal bruits. No CVA tenderness. Musculoskeletal: No obvious deformity to the left upper extremity.  Decreased range of motion with abduction overhead reaching.  Decreased range of motion with flexion and extension of the left wrist.  Patient has moderate guarding palpation the [place] joint. Neurologic:  Normal speech and language. No gross focal neurologic deficits are appreciated. No gait instability. Skin:  Skin is warm, dry and intact. No rash noted. Psychiatric: Mood and affect are normal. Speech and behavior are normal.  ____________________________________________   LABS (all labs ordered are listed, but only abnormal results are displayed)  Labs Reviewed - No data to display ____________________________________________  EKG   ____________________________________________  RADIOLOGY  ED MD interpretation:    Official radiology report(s): Dg Forearm Left  Result Date: 03/05/2018 CLINICAL DATA:  Pain following fall EXAM: LEFT FOREARM - 2 VIEW COMPARISON:  None. FINDINGS: Frontal and lateral views were obtained. No evident fracture or dislocation. Joint spaces appear normal. No elbow joint effusion. No erosive change. IMPRESSION: No fracture or dislocation.  No evident arthropathy. Electronically Signed   By: Lowella Grip III M.D.   On: 03/05/2018 15:28   Dg Shoulder Left  Result Date: 03/05/2018 CLINICAL DATA:  Shoulder pain after a fall. EXAM: LEFT SHOULDER - 2+ VIEW COMPARISON:  None. FINDINGS: There is no evidence of fracture or dislocation. There is no evidence of arthropathy or other focal bone abnormality. Soft tissues are unremarkable. IMPRESSION: Negative.  Electronically Signed   By: Misty Stanley M.D.   On: 03/05/2018 15:27    ____________________________________________   PROCEDURES  Procedure(s) performed:   Procedures  Critical Care performed:   ____________________________________________   INITIAL IMPRESSION / ASSESSMENT AND PLAN / ED COURSE  As part of my medical decision making, I reviewed the following data within the [facility]    Left wrist and shoulder pain secondary to fall.  Discussed negative x-ray findings of the left upper extremity with patient.  Patient placed in arm sling and wrist splint.  Patient given discharge care instruction.  Patient advised take extra strength Tylenol as needed for pain.  Patient advised to follow-up PCP.      ____________________________________________   FINAL CLINICAL IMPRESSION(S) / ED DIAGNOSES  Final diagnoses:  Acute pain of left shoulder  Left elbow pain  Pain in left wrist     ED Discharge Orders    None       Note:  This document was prepared using Dragon voice recognition software and may include unintentional dictation errors.    Sable Feil, PA-C 03/05/18 1540    Schaevitz, Randall An, MD 03/05/18 1600

## 2018-03-05 NOTE — OB Triage Note (Signed)
Patient 21week 6 days pregnant and fell one hour ago going up stairs and fell on abdomen. Pt states she has not felt baby move since fall. Patient also guarding left arm as she states she tried to catch herself but couldn't and hurt her shoulder in the process. Denies LOF, bleeding or any other concerns.

## 2018-03-05 NOTE — Discharge Instructions (Signed)
Wear splint and sling for 2 to 3 days as needed.  Follow discharge care instruction.  Take extra strength Tylenol as needed for pain.

## 2018-03-05 NOTE — Discharge Instructions (Signed)
Please report bleeding, leakage of fluid, persistent lower abdominal pain or decreased fetal movement to [facility] or return to L&D.

## 2018-03-23 ENCOUNTER — Ambulatory Visit (INDEPENDENT_AMBULATORY_CARE_PROVIDER_SITE_OTHER): Payer: Medicaid Other | Admitting: Obstetrics and Gynecology

## 2018-03-23 ENCOUNTER — Ambulatory Visit (INDEPENDENT_AMBULATORY_CARE_PROVIDER_SITE_OTHER): Payer: Medicaid Other

## 2018-03-23 ENCOUNTER — Encounter: Payer: Self-pay | Admitting: Obstetrics and Gynecology

## 2018-03-23 VITALS — BP 110/68 | Wt 201.5 lb

## 2018-03-23 DIAGNOSIS — F418 Other specified anxiety disorders: Secondary | ICD-10-CM

## 2018-03-23 DIAGNOSIS — O34219 Maternal care for unspecified type scar from previous cesarean delivery: Secondary | ICD-10-CM

## 2018-03-23 DIAGNOSIS — Z3402 Encounter for supervision of normal first pregnancy, second trimester: Secondary | ICD-10-CM

## 2018-03-23 DIAGNOSIS — R51 Headache: Secondary | ICD-10-CM

## 2018-03-23 DIAGNOSIS — O09299 Supervision of pregnancy with other poor reproductive or obstetric history, unspecified trimester: Secondary | ICD-10-CM

## 2018-03-23 DIAGNOSIS — O0990 Supervision of high risk pregnancy, unspecified, unspecified trimester: Secondary | ICD-10-CM

## 2018-03-23 DIAGNOSIS — O99810 Abnormal glucose complicating pregnancy: Secondary | ICD-10-CM

## 2018-03-23 DIAGNOSIS — O24419 Gestational diabetes mellitus in pregnancy, unspecified control: Secondary | ICD-10-CM

## 2018-03-23 DIAGNOSIS — Z3A24 24 weeks gestation of pregnancy: Secondary | ICD-10-CM | POA: Diagnosis not present

## 2018-03-23 DIAGNOSIS — Z98891 History of uterine scar from previous surgery: Secondary | ICD-10-CM

## 2018-03-23 DIAGNOSIS — O99342 Other mental disorders complicating pregnancy, second trimester: Secondary | ICD-10-CM

## 2018-03-23 DIAGNOSIS — Z0489 Encounter for examination and observation for other specified reasons: Secondary | ICD-10-CM

## 2018-03-23 DIAGNOSIS — O9989 Other specified diseases and conditions complicating pregnancy, childbirth and the puerperium: Secondary | ICD-10-CM

## 2018-03-23 DIAGNOSIS — O09292 Supervision of pregnancy with other poor reproductive or obstetric history, second trimester: Secondary | ICD-10-CM

## 2018-03-23 DIAGNOSIS — IMO0002 Reserved for concepts with insufficient information to code with codable children: Secondary | ICD-10-CM

## 2018-03-23 NOTE — Progress Notes (Signed)
Routine Prenatal Care Visit  Subjective  Sonya Spencer is a 32 y.o. [phone] at [redacted]w[redacted]d being seen today for ongoing prenatal care.  She is currently monitored for the following issues for this high-risk pregnancy and has Anxiety and depression; Frequent headaches; Lipoma of axilla; History of macrosomia in infant in prior pregnancy, currently pregnant; Nausea and vomiting during pregnancy; History of cesarean delivery; Supervision of high risk pregnancy, antepartum; Abnormal glucose tolerance test (GTT) during pregnancy, antepartum; Gestational diabetes mellitus (GDM) in second trimester; and Fall (on) (from) other stairs and steps, initial encounter on their problem list.  ----------------------------------------------------------------------------------- Patient reports no complaints.   Contractions: Not present. Vag. Bleeding: None.  Movement: Present. Denies leaking of fluid.  Ultrasound complete for anatomy.  Pt did not bring BG log. States they are all normal except one 2 hour pp value that was 122.  She had some sort of dessert that day due to a birthday party. She states she will send in a copy of the log through the MyChart portal.  ----------------------------------------------------------------------------------- The following portions of the patient's history were reviewed and updated as appropriate: allergies, current medications, past family history, past medical history, past social history, past surgical history and problem list. Problem list updated.   Objective  Blood pressure 110/68, weight 201 lb 8 oz (91.4 kg), last menstrual period 10/03/2017, currently breastfeeding. Pregravid weight 199 lb (90.3 kg) Total Weight Gain 2 lb 8 oz (1.134 kg) Urinalysis: Urine Protein: Trace Urine Glucose: Negative  Fetal Status: Fetal Heart Rate (bpm): 150   Movement: Present     General:  Alert, oriented and cooperative. Patient is in no acute distress.  Skin: Skin is warm and dry. No  rash noted.   Cardiovascular: Normal heart rate noted  Respiratory: Normal respiratory effort, no problems with respiration noted  Abdomen: Soft, gravid, appropriate for gestational age. Pain/Pressure: Absent     Pelvic:  Cervical exam deferred        Extremities: Normal range of motion.  Edema: None  Mental Status: Normal mood and affect. Normal behavior. Normal judgment and thought content.   Images: US Ob Follow Up  Result Date: 03/23/2018 ULTRASOUND REPORT Patient Name: JASZMINE NAVEJAS DOB: 1986-04-08 MRN: [id number] Location: [facility] Date of Service: 03/23/2018 Indications: Follow up Anatomy Findings: Singleton intrauterine pregnancy is visualized with FHR at 150 BPM. Fetal presentation is Cephalic. Placenta: Posterior, grade 1. AFI: subjectively normal. Anatomic survey is complete. Impression: 1. [redacted]w[redacted]d Viable Singleton Intrauterine pregnancy previously established criteria. 2. Normal Anatomy Scan is now complete Recommendations: 1.Clinical correlation with the patient's History and Physical Exam. Edwena Bunde, RDMS, RVT There is a singleton gestation with subjectively normal amniotic fluid volume. The fetal biometry correlates with established dating. Detailed evaluation of the fetal anatomy was performed.The fetal anatomical survey appears within normal limits within the resolution of ultrasound as described above.  The rest of the anatomy exam has been performed previously and is noted on that report.  It must be noted that a normal ultrasound is unable to rule out fetal aneuploidy nor is it able to detect all possible malformations.   The ultrasound images and findings were reviewed by me and I agree with the above report. Prentice Docker, MD, [facility], [facility] 03/23/2018 9:38 AM    Assessment   32 y.o. [id number] at [redacted]w[redacted]d by  07/10/2018, by Last Menstrual Period presenting for routine prenatal visit  Plan   FOURTH Problems (from 11/09/17 to present)  Problem Noted Resolved   Abnormal glucose tolerance test (GTT) during pregnancy, antepartum 12/23/2017 by Will Bonnet, MD No   Overview Signed 12/23/2017  9:13 AM by Will Bonnet, MD    - pt check BG log.   - Lifestyles referral for presumptive GDM (also due to inordinate weight loss early in pregnancy) - will treat as GDM.  Patient has refused 3 hour gtt.        Supervision of high risk pregnancy, antepartum 11/16/2017 by Rod Can, CNM No   Overview Addendum 12/16/2017 12:06 PM by Homero Fellers, MD    [facility]inic [facility] Prenatal Labs  Dating L=7 Blood type: A/Positive/-- (03/04 1111)   Genetic Screen Declines Antibody:Negative (03/04 1111)  Anatomic [place]  Rubella: 2.29 (03/04 1111) Varicella: Immune  GTT Early:149, 3 hr: DECLINED   Third trimester:  RPR: Non Reactive (03/04 1111)   Rhogam  not applicable HBsAg: Negative (03/04 1111)   TDaP vaccine                        Flu Shot: 05/19/2017 HIV: Non Reactive (03/04 1111)   Baby Food                                GBS:   Contraception  Pap: Needs   CBB     CS/VBAC  Desires VBAC   Support Person              Supervision of normal pregnancy 11/09/2017 by Rexene Agent, CNM 11/16/2017 by Rod Can, CNM   Overview Signed 11/09/2017 10:00 AM by Rexene Agent, [place] Prenatal Labs  Dating  Blood type:     Genetic Screen 1 Screen:    AFP:     Quad:     NIPS: Antibody:   Anatomic [place]  Rubella:   Varicella:    GTT Early:               Third trimester:  RPR:     Rhogam  HBsAg:     TDaP vaccine                       Flu Shot: HIV:     Baby Food                                GBS:   Contraception  Pap:  CBB     CS/VBAC    Support Person                  Preterm labor symptoms and general obstetric precautions including but not limited to vaginal bleeding, contractions, leaking of fluid and fetal movement were reviewed in detail with the patient. Please refer to After Visit Summary  for other counseling recommendations.   Return in about 1 month (around 04/20/2018) for 28 week labs and routine prenatal.  Prentice Docker, MD, [place], [facility] 03/23/2018 9:40 AM

## 2018-03-23 NOTE — Progress Notes (Incomplete)
Routine Prenatal Care Visit  Subjective  Sonya Spencer is a 32 y.o. [phone] at [redacted]w[redacted]d being seen today for ongoing prenatal care.  She is currently monitored for the following issues for this {Blank single:19197::"high-risk","low-risk"} pregnancy and has Anxiety and depression; Frequent headaches; Lipoma of axilla; History of macrosomia in infant in prior pregnancy, currently pregnant; Nausea and vomiting during pregnancy; History of cesarean delivery; Supervision of high risk pregnancy, antepartum; Abnormal glucose tolerance test (GTT) during pregnancy, antepartum; Gestational diabetes mellitus (GDM) in second trimester; and Fall (on) (from) other stairs and steps, initial encounter on their problem list.  ----------------------------------------------------------------------------------- Patient reports {sx:14538}.   Contractions: Not present. Vag. Bleeding: None.  Movement: Present. Denies leaking of fluid.  ----------------------------------------------------------------------------------- The following portions of the patient's history were reviewed and updated as appropriate: allergies, current medications, past family history, past medical history, past social history, past surgical history and problem list. Problem list updated.   Objective  Blood pressure 110/68, weight 201 lb 8 oz (91.4 kg), last menstrual period 10/03/2017, currently breastfeeding. Pregravid weight 199 lb (90.3 kg) Total Weight Gain 2 lb 8 oz (1.134 kg) Urinalysis: Urine Protein: Trace Urine Glucose: Negative  Fetal Status: Fetal Heart Rate (bpm): 150   Movement: Present     General:  Alert, oriented and cooperative. Patient is in no acute distress.  Skin: Skin is warm and dry. No rash noted.   Cardiovascular: Normal heart rate noted  Respiratory: Normal respiratory effort, no problems with respiration noted  Abdomen: Soft, gravid, appropriate for gestational age. Pain/Pressure: Absent     Pelvic:  {Blank  single:19197::"Cervical exam performed","Cervical exam deferred"}        Extremities: Normal range of motion.  Edema: None  Mental Status: Normal mood and affect. Normal behavior. Normal judgment and thought content.   Assessment   32 y.o. G4P3003 at [redacted]w[redacted]d by  07/10/2018, by Last Menstrual Period presenting for {Blank single:19197::"routine","work-in"} prenatal visit  Plan   FOURTH Problems (from 11/09/17 to present)    Problem Noted Resolved   Abnormal glucose tolerance test (GTT) during pregnancy, antepartum 12/23/2017 by Will Bonnet, MD No   Overview Signed 12/23/2017  9:13 AM by Will Bonnet, MD    - pt check BG log.   - Lifestyles referral for presumptive GDM (also due to inordinate weight loss early in pregnancy) - will treat as GDM.  Patient has refused 3 hour gtt.        Supervision of high risk pregnancy, antepartum 11/16/2017 by Rod Can, CNM No   Overview Addendum 12/16/2017 12:06 PM by Homero Fellers, MD    [facility] Prenatal Labs  Dating L=7 Blood type: A/Positive/-- (03/04 1111)   Genetic Screen Declines Antibody:Negative (03/04 1111)  Anatomic [place]  Rubella: 2.29 (03/04 1111) Varicella: Immune  GTT Early:149, 3 hr: DECLINED   Third trimester:  RPR: Non Reactive (03/04 1111)   Rhogam  not applicable HBsAg: Negative (03/04 1111)   TDaP vaccine                        Flu Shot: 05/19/2017 HIV: Non Reactive (03/04 1111)   Baby Food                                GBS:   Contraception  Pap: Needs   CBB     CS/VBAC  Desires VBAC   Support Person  Supervision of normal pregnancy 11/09/2017 by Rexene Agent, CNM 11/16/2017 by Rod Can, CNM   Overview Signed 11/09/2017 10:00 AM by Rexene Agent, [place] Prenatal Labs  Dating  Blood type:     Genetic Screen 1 Screen:    AFP:     Quad:     NIPS: Antibody:   Anatomic [place]  Rubella:   Varicella:    GTT Early:               Third trimester:  RPR:     Rhogam   HBsAg:     TDaP vaccine                       Flu Shot: HIV:     Baby Food                                GBS:   Contraception  Pap:  CBB     CS/VBAC    Support Person                  {Blank single:19197::"Term","Preterm"} labor symptoms and general obstetric precautions including but not limited to vaginal bleeding, contractions, leaking of fluid and fetal movement were reviewed in detail with the patient. Please refer to After Visit Summary for other counseling recommendations.   Return in about 1 month (around 04/20/2018) for 28 week labs and routine prenatal.  Prentice Docker, MD, [place], [facility] 03/23/2018 9:40 AM

## 2018-03-31 ENCOUNTER — Other Ambulatory Visit: Payer: Commercial Managed Care - PPO

## 2018-04-05 ENCOUNTER — Encounter: Payer: Commercial Managed Care - PPO | Admitting: Nurse Practitioner

## 2018-04-09 ENCOUNTER — Encounter: Payer: Self-pay | Admitting: Obstetrics and Gynecology

## 2018-04-09 ENCOUNTER — Other Ambulatory Visit: Payer: Self-pay | Admitting: Obstetrics and Gynecology

## 2018-04-09 DIAGNOSIS — O09299 Supervision of pregnancy with other poor reproductive or obstetric history, unspecified trimester: Secondary | ICD-10-CM

## 2018-04-15 ENCOUNTER — Other Ambulatory Visit: Payer: Medicaid Other

## 2018-04-15 ENCOUNTER — Encounter: Payer: Medicaid Other | Admitting: Obstetrics and Gynecology

## 2018-04-22 ENCOUNTER — Ambulatory Visit (INDEPENDENT_AMBULATORY_CARE_PROVIDER_SITE_OTHER): Payer: Medicaid Other

## 2018-04-22 ENCOUNTER — Encounter: Payer: Medicaid Other | Admitting: Obstetrics & Gynecology

## 2018-04-22 ENCOUNTER — Encounter: Payer: Self-pay | Admitting: Obstetrics and Gynecology

## 2018-04-22 ENCOUNTER — Ambulatory Visit (INDEPENDENT_AMBULATORY_CARE_PROVIDER_SITE_OTHER): Payer: Medicaid Other | Admitting: Obstetrics and Gynecology

## 2018-04-22 VITALS — BP 106/64 | Wt 205.0 lb

## 2018-04-22 DIAGNOSIS — Z3A28 28 weeks gestation of pregnancy: Secondary | ICD-10-CM | POA: Diagnosis not present

## 2018-04-22 DIAGNOSIS — O09293 Supervision of pregnancy with other poor reproductive or obstetric history, third trimester: Secondary | ICD-10-CM

## 2018-04-22 DIAGNOSIS — Z98891 History of uterine scar from previous surgery: Secondary | ICD-10-CM

## 2018-04-22 DIAGNOSIS — O0990 Supervision of high risk pregnancy, unspecified, unspecified trimester: Secondary | ICD-10-CM

## 2018-04-22 DIAGNOSIS — O09299 Supervision of pregnancy with other poor reproductive or obstetric history, unspecified trimester: Secondary | ICD-10-CM

## 2018-04-22 DIAGNOSIS — Z113 Encounter for screening for infections with a predominantly sexual mode of transmission: Secondary | ICD-10-CM

## 2018-04-22 DIAGNOSIS — O24419 Gestational diabetes mellitus in pregnancy, unspecified control: Secondary | ICD-10-CM

## 2018-04-22 NOTE — Progress Notes (Incomplete)
Routine Prenatal Care Visit  Subjective  Sonya Spencer is a 32 y.o. [phone] at [redacted]w[redacted]d being seen today for ongoing prenatal care.  She is currently monitored for the following issues for this {Blank single:19197::"high-risk","low-risk"} pregnancy and has Anxiety and depression; Frequent headaches; Lipoma of axilla; History of macrosomia in infant in prior pregnancy, currently pregnant; Nausea and vomiting during pregnancy; History of cesarean delivery; Supervision of high risk pregnancy, antepartum; Abnormal glucose tolerance test (GTT) during pregnancy, antepartum; Gestational diabetes mellitus (GDM) affecting third pregnancy; and Fall (on) (from) other stairs and steps, initial encounter on their problem list.  ----------------------------------------------------------------------------------- Patient reports {sx:14538}.   Contractions: Not present. Vag. Bleeding: None.  Movement: Present. Denies leaking of fluid.  ----------------------------------------------------------------------------------- The following portions of the patient's history were reviewed and updated as appropriate: allergies, current medications, past family history, past medical history, past social history, past surgical history and problem list. Problem list updated.   Objective  Blood pressure 106/64, weight 205 lb (93 kg), last menstrual period 10/03/2017, currently breastfeeding. Pregravid weight 199 lb (90.3 kg) Total Weight Gain 6 lb (2.722 kg) Urinalysis: Urine Protein: Trace Urine Glucose: Negative  Fetal Status: Fetal Heart Rate (bpm): Present   Movement: Present  Presentation: Pilar Plate Breech  General:  Alert, oriented and cooperative. Patient is in no acute distress.  Skin: Skin is warm and dry. No rash noted.   Cardiovascular: Normal heart rate noted  Respiratory: Normal respiratory effort, no problems with respiration noted  Abdomen: Soft, gravid, appropriate for gestational age. Pain/Pressure: Absent       Pelvic:  {Blank single:19197::"Cervical exam performed","Cervical exam deferred"}        Extremities: Normal range of motion.  Edema: None  Mental Status: Normal mood and affect. Normal behavior. Normal judgment and thought content.   Assessment   32 y.o. G4P3003 at [redacted]w[redacted]d by  07/10/2018, by Last Menstrual Period presenting for {Blank single:19197::"routine","work-in"} prenatal visit  Plan   FOURTH Problems (from 11/09/17 to present)    Problem Noted Resolved   Gestational diabetes mellitus (GDM) affecting third pregnancy 02/05/2018 by Homero Fellers, MD No   Abnormal glucose tolerance test (GTT) during pregnancy, antepartum 12/23/2017 by Will Bonnet, MD No   Overview Signed 12/23/2017  9:13 AM by Will Bonnet, MD    - pt check BG log.   - Lifestyles referral for presumptive GDM (also due to inordinate weight loss early in pregnancy) - will treat as GDM.  Patient has refused 3 hour gtt.        Supervision of high risk pregnancy, antepartum 11/16/2017 by Rod Can, CNM No   Overview Addendum 12/16/2017 12:06 PM by Homero Fellers, MD    [facility] Prenatal Labs  Dating L=7 Blood type: A/Positive/-- (03/04 1111)   Genetic Screen Declines Antibody:Negative (03/04 1111)  Anatomic [place]  Rubella: 2.29 (03/04 1111) Varicella: Immune  GTT Early:149, 3 hr: DECLINED   Third trimester:  RPR: Non Reactive (03/04 1111)   Rhogam  not applicable HBsAg: Negative (03/04 1111)   TDaP vaccine                        Flu Shot: 05/19/2017 HIV: Non Reactive (03/04 1111)   Baby Food                                GBS:   Contraception  Pap: Needs   CBB  CS/VBAC  Desires VBAC   Support Person              Supervision of normal pregnancy 11/09/2017 by Rexene Agent, CNM 11/16/2017 by Rod Can, CNM   Overview Signed 11/09/2017 10:00 AM by Rexene Agent, [place] Prenatal Labs  Dating  Blood type:     Genetic Screen 1 Screen:    AFP:     Quad:      NIPS: Antibody:   Anatomic [place]  Rubella:   Varicella:    GTT Early:               Third trimester:  RPR:     Rhogam  HBsAg:     TDaP vaccine                       Flu Shot: HIV:     Baby Food                                GBS:   Contraception  Pap:  CBB     CS/VBAC    Support Person                  {Blank single:19197::"Term","Preterm"} labor symptoms and general obstetric precautions including but not limited to vaginal bleeding, contractions, leaking of fluid and fetal movement were reviewed in detail with the patient. Please refer to After Visit Summary for other counseling recommendations.   Return in about 2 weeks (around 05/06/2018) for Routine Prenatal Appointment.  Prentice Docker, MD, [facility], [facility] 04/22/2018 10:49 AM

## 2018-04-22 NOTE — Progress Notes (Signed)
Routine Prenatal Care Visit  Subjective  Sonya Spencer is a 32 y.o. [phone] at [redacted]w[redacted]d being seen today for ongoing prenatal care.  She is currently monitored for the following issues for this high-risk pregnancy and has Anxiety and depression; Frequent headaches; Lipoma of axilla; History of macrosomia in infant in prior pregnancy, currently pregnant; Nausea and vomiting during pregnancy; History of cesarean delivery; Supervision of high risk pregnancy, antepartum; Abnormal glucose tolerance test (GTT) during pregnancy, antepartum; Gestational diabetes mellitus (GDM) affecting third pregnancy; and Fall (on) (from) other stairs and steps, initial encounter on their problem list.  ----------------------------------------------------------------------------------- Patient reports no complaints.   Contractions: Not present. Vag. Bleeding: None.  Movement: Present. Denies leaking of fluid.  GDM: patient brought meter today. Nearly all values normal (one abnormal in past two weeks) U/S shows normal growth, afi 22 cm. ----------------------------------------------------------------------------------- The following portions of the patient's history were reviewed and updated as appropriate: allergies, current medications, past family history, past medical history, past social history, past surgical history and problem list. Problem list updated.   Objective  Blood pressure 106/64, weight 205 lb (93 kg), last menstrual period 10/03/2017, currently breastfeeding. Pregravid weight 199 lb (90.3 kg) Total Weight Gain 6 lb (2.722 kg) Urinalysis: Urine Protein: Trace Urine Glucose: Negative  Fetal Status: Fetal Heart Rate (bpm): Present   Movement: Present  Presentation: Pilar Plate Breech  General:  Alert, oriented and cooperative. Patient is in no acute distress.  Skin: Skin is warm and dry. No rash noted.   Cardiovascular: Normal heart rate noted  Respiratory: Normal respiratory effort, no problems with  respiration noted  Abdomen: Soft, gravid, appropriate for gestational age. Pain/Pressure: Absent     Pelvic:  Cervical exam deferred        Extremities: Normal range of motion.  Edema: None  Mental Status: Normal mood and affect. Normal behavior. Normal judgment and thought content.   US Ob Follow Up  Result Date: 04/22/2018 ULTRASOUND REPORT Patient Name: Sonya Spencer DOB: 1986/04/13 MRN: [id number] Location: [facility] Date of Service: 04/22/2018 Indications:growth/afi Findings: Nelda [place] intrauterine pregnancy is visualized with FHR at 162 BPM. Biometrics give an (U/S) Gestational age of [redacted]w[redacted]d and an (U/S) EDD of 07/06/2018; this correlates with the clinically established Estimated Date of Delivery: 07/10/18. Fetal presentation is Breech. Placenta: Posterior, grade 1. AFI: 22.10 cm Growth percentile is 56th. EFW: 2lbs 14oz/ 1,318 grams Impression: 1. [redacted]w[redacted]d Viable Singleton Intrauterine pregnancy previously established criteria. 2. Growth is 56th %ile.  AFI is 22.10 cm. Recommendations: 1.Clinical correlation with the patient's History and Physical Exam. Edwena Bunde, RDMS, RVT The ultrasound images and findings were reviewed by me and I agree with the above report. Prentice Docker, MD, [facility], [facility] 04/22/2018 10:31 AM    Assessment   32 y.o. [id number] at [redacted]w[redacted]d by  07/10/2018, by Last Menstrual Period presenting for routine prenatal visit  Plan   FOURTH Problems (from 11/09/17 to present)    Problem Noted Resolved   Gestational diabetes mellitus (GDM) affecting third pregnancy 02/05/2018 by Homero Fellers, MD No   Abnormal glucose tolerance test (GTT) during pregnancy, antepartum 12/23/2017 by Will Bonnet, MD No   Overview Signed 12/23/2017  9:13 AM by Will Bonnet, MD    - pt check BG log.   - Lifestyles referral for presumptive GDM (also due to inordinate weight loss early in pregnancy) - will treat as GDM.  Patient has refused 3  hour gtt.  Supervision of high risk pregnancy, antepartum 11/16/2017 by Rod Can, CNM No   Overview Addendum 12/16/2017 12:06 PM by Homero Fellers, MD    [facility] Prenatal Labs  Dating L=7 Blood type: A/Positive/-- (03/04 1111)   Genetic Screen Declines Antibody:Negative (03/04 1111)  Anatomic [place]  Rubella: 2.29 (03/04 1111) Varicella: Immune  GTT Early:149, 3 hr: DECLINED   Third trimester:  RPR: Non Reactive (03/04 1111)   Rhogam  not applicable HBsAg: Negative (03/04 1111)   TDaP vaccine                        Flu Shot: 05/19/2017 HIV: Non Reactive (03/04 1111)   Baby Food                                GBS:   Contraception  Pap: Needs   CBB     CS/VBAC  Desires VBAC   Support Person              Supervision of normal pregnancy 11/09/2017 by Rexene Agent, CNM 11/16/2017 by Rod Can, CNM   Overview Signed 11/09/2017 10:00 AM by Rexene Agent, [place] Prenatal Labs  Dating  Blood type:     Genetic Screen 1 Screen:    AFP:     Quad:     NIPS: Antibody:   Anatomic [place]  Rubella:   Varicella:    GTT Early:               Third trimester:  RPR:     Rhogam  HBsAg:     TDaP vaccine                       Flu Shot: HIV:     Baby Food                                GBS:   Contraception  Pap:  CBB     CS/VBAC    Support Person                 Preterm labor symptoms and general obstetric precautions including but not limited to vaginal bleeding, contractions, leaking of fluid and fetal movement were reviewed in detail with the patient. Please refer to After Visit Summary for other counseling recommendations.   - 28 week labs today (minus glucose test)  Return in about 2 weeks (around 05/06/2018) for Routine Prenatal Appointment.  Prentice Docker, MD, [facility], [facility] 04/22/2018 10:49 AM

## 2018-04-23 ENCOUNTER — Encounter: Payer: Self-pay | Admitting: Obstetrics and Gynecology

## 2018-04-23 LAB — CBC
Hematocrit: 33.3 % — ABNORMAL LOW (ref 34.0–46.6)
Hemoglobin: 11.2 g/dL (ref 11.1–15.9)
MCH: 29.6 pg (ref 26.6–33.0)
MCHC: 33.6 g/dL (ref 31.5–35.7)
MCV: 88 fL (ref 79–97)
Platelets: 250 10*3/uL (ref 150–450)
RBC: 3.79 x10E6/uL (ref 3.77–5.28)
RDW: 14.3 % (ref 12.3–15.4)
WBC: 11.3 10*3/uL — ABNORMAL HIGH (ref 3.4–10.8)

## 2018-04-23 LAB — HIV ANTIBODY (ROUTINE TESTING W REFLEX): HIV Screen 4th Generation wRfx: NONREACTIVE

## 2018-04-23 LAB — RPR QUALITATIVE: RPR: NONREACTIVE

## 2018-04-28 ENCOUNTER — Encounter: Payer: Self-pay | Admitting: Obstetrics and Gynecology

## 2018-05-06 ENCOUNTER — Encounter: Payer: Self-pay | Admitting: Maternal Newborn

## 2018-05-06 ENCOUNTER — Ambulatory Visit (INDEPENDENT_AMBULATORY_CARE_PROVIDER_SITE_OTHER): Payer: Commercial Managed Care - PPO | Admitting: Maternal Newborn

## 2018-05-06 VITALS — BP 110/60 | Wt 203.2 lb

## 2018-05-06 DIAGNOSIS — O24419 Gestational diabetes mellitus in pregnancy, unspecified control: Secondary | ICD-10-CM

## 2018-05-06 DIAGNOSIS — Z23 Encounter for immunization: Secondary | ICD-10-CM

## 2018-05-06 DIAGNOSIS — F329 Major depressive disorder, single episode, unspecified: Secondary | ICD-10-CM

## 2018-05-06 DIAGNOSIS — O09293 Supervision of pregnancy with other poor reproductive or obstetric history, third trimester: Secondary | ICD-10-CM

## 2018-05-06 DIAGNOSIS — Z369 Encounter for antenatal screening, unspecified: Secondary | ICD-10-CM

## 2018-05-06 DIAGNOSIS — O24410 Gestational diabetes mellitus in pregnancy, diet controlled: Secondary | ICD-10-CM

## 2018-05-06 DIAGNOSIS — O0990 Supervision of high risk pregnancy, unspecified, unspecified trimester: Secondary | ICD-10-CM

## 2018-05-06 DIAGNOSIS — Z3A30 30 weeks gestation of pregnancy: Secondary | ICD-10-CM | POA: Diagnosis not present

## 2018-05-06 DIAGNOSIS — F419 Anxiety disorder, unspecified: Secondary | ICD-10-CM

## 2018-05-06 DIAGNOSIS — O99343 Other mental disorders complicating pregnancy, third trimester: Secondary | ICD-10-CM

## 2018-05-06 DIAGNOSIS — O34219 Maternal care for unspecified type scar from previous cesarean delivery: Secondary | ICD-10-CM

## 2018-05-06 DIAGNOSIS — F418 Other specified anxiety disorders: Secondary | ICD-10-CM

## 2018-05-06 LAB — POCT URINALYSIS DIPSTICK OB
GLUCOSE, UA: NEGATIVE — AB
PROTEIN: NEGATIVE

## 2018-05-06 MED ORDER — TETANUS-DIPHTH-ACELL PERTUSSIS 5-2.5-18.5 LF-MCG/0.5 IM SUSP
0.5000 mL | Freq: Once | INTRAMUSCULAR | Status: AC
  Administered 2018-05-06: 0.5 mL via INTRAMUSCULAR

## 2018-05-06 MED ORDER — ESCITALOPRAM OXALATE 10 MG PO TABS
10.0000 mg | ORAL_TABLET | Freq: Every day | ORAL | 2 refills | Status: DC
Start: 2018-05-06 — End: 2018-08-16

## 2018-05-06 NOTE — Progress Notes (Signed)
Routine Prenatal Care Visit  Subjective  Sonya Spencer is a 32 y.o. [phone] at [redacted]w[redacted]d being seen today for ongoing prenatal care.  She is currently monitored for the following issues for this high-risk pregnancy and has Anxiety and depression; Frequent headaches; Lipoma of axilla; History of macrosomia in infant in prior pregnancy, currently pregnant; Nausea and vomiting during pregnancy; History of cesarean delivery; Supervision of high risk pregnancy, antepartum; Abnormal glucose tolerance test (GTT) during pregnancy, antepartum; Gestational diabetes mellitus (GDM) affecting third pregnancy; and Fall (on) (from) other stairs and steps, initial encounter on their problem list.  ----------------------------------------------------------------------------------- Patient reports Braxton-Hicks contractions.   Contractions: Not present. Vag. Bleeding: None.  Movement: Present. No leaking of fluid.  ----------------------------------------------------------------------------------- The following portions of the patient's history were reviewed and updated as appropriate: allergies, current medications, past family history, past medical history, past social history, past surgical history and problem list. Problem list updated.  Objective  Blood pressure 110/60, weight 203 lb 4 oz (92.2 kg), last menstrual period 10/03/2017. Body mass index is 36 kg/m. Pregravid weight 199 lb (90.3 kg) Total Weight Gain 4 lb 4 oz (1.928 kg)  Fetal Status: Fetal Heart Rate (bpm): 136 Fundal Height: 33 cm Movement: Present     General:  Alert, oriented and cooperative. Patient is in no acute distress.  Skin: Skin is warm and dry. No rash noted.   Cardiovascular: Normal heart rate noted  Respiratory: Normal respiratory effort, no problems with respiration noted  Abdomen: Soft, gravid, appropriate for gestational age. Pain/Pressure: Present     Pelvic:  Cervical exam deferred        Extremities: Normal range of  motion.  Edema: None  Mental Status: Normal mood and affect. Normal behavior. Normal judgment and thought content.    Assessment   32 y.o. [id number] at [redacted]w[redacted]d, EDD 07/10/2018 by Last Menstrual Period presenting for routine prenatal visit.  Plan   FOURTH Problems (from 11/09/17 to present)    Problem Noted Resolved   Gestational diabetes mellitus (GDM) affecting third pregnancy 02/05/2018 by Homero Fellers, MD No   Abnormal glucose tolerance test (GTT) during pregnancy, antepartum 12/23/2017 by Will Bonnet, MD No   Overview Signed 12/23/2017  9:13 AM by Will Bonnet, MD    - pt check BG log.   - Lifestyles referral for presumptive GDM (also due to inordinate weight loss early in pregnancy) - will treat as GDM.  Patient has refused 3 hour gtt.        Supervision of high risk pregnancy, antepartum 11/16/2017 by Rod Can, CNM No   Overview Addendum 12/16/2017 12:06 PM by Homero Fellers, MD    Clinic [facility] Prenatal Labs  Dating L=7 Blood type: A/Positive/-- (03/04 1111)   Genetic Screen Declines Antibody:Negative (03/04 1111)  Anatomic [place]  Rubella: 2.29 (03/04 1111) Varicella: Immune  GTT Early:149, 3 hr: DECLINED   Third trimester:  RPR: Non Reactive (03/04 1111)   Rhogam  not applicable HBsAg: Negative (03/04 1111)   TDaP vaccine                        Flu Shot: 05/19/2017 HIV: Non Reactive (03/04 1111)   Baby Food                                GBS:   Contraception  Pap: Needs   CBB     CS/VBAC  Desires VBAC   Support Person              Supervision of normal pregnancy 11/09/2017 by Rexene Agent, CNM 11/16/2017 by Rod Can, CNM   Overview Signed 11/09/2017 10:00 AM by Rexene Agent, [place] Prenatal Labs  Dating  Blood type:     Genetic Screen 1 Screen:    AFP:     Quad:     NIPS: Antibody:   Anatomic [place]  Rubella:   Varicella:    GTT Early:               Third trimester:  RPR:     Rhogam  HBsAg:     TDaP vaccine                        Flu Shot: HIV:     Baby Food                                GBS:   Contraception  Pap:  CBB     CS/VBAC    Support Person               Glucose log reviewed. Fasting values range from 74-101. 3/14 abnormal (98, 100, 101). She states that these higher values occur when she wakes in the early morning hours with nausea and drinks some juice or has a small snack. Postprandial values are all in normal range (78-117).  Rx given for Lexapro 10 mg, she is doing well on this dose. TSaP done today.  Preterm labor symptoms and general obstetric precautions were reviewed.  Return in about 1 week (around 05/13/2018) for ROB and ultrasound.  Avel Sensor, CNM 05/06/2018  10:42 AM

## 2018-05-06 NOTE — Progress Notes (Signed)
No concerns. BT signed; Tdap given; BTL paper signed.rj

## 2018-05-08 ENCOUNTER — Other Ambulatory Visit: Payer: Self-pay

## 2018-05-08 ENCOUNTER — Observation Stay
Admission: EM | Admit: 2018-05-08 | Discharge: 2018-05-08 | Disposition: A | Discharge status: Home / Self Care | Payer: Commercial Managed Care - PPO | Attending: Obstetrics and Gynecology | Admitting: Obstetrics and Gynecology

## 2018-05-08 DIAGNOSIS — Z3A31 31 weeks gestation of pregnancy: Secondary | ICD-10-CM | POA: Insufficient documentation

## 2018-05-08 DIAGNOSIS — O99013 Anemia complicating pregnancy, third trimester: Secondary | ICD-10-CM | POA: Diagnosis not present

## 2018-05-08 DIAGNOSIS — Z91048 Other nonmedicinal substance allergy status: Secondary | ICD-10-CM | POA: Diagnosis not present

## 2018-05-08 DIAGNOSIS — Z9102 Food additives allergy status: Secondary | ICD-10-CM | POA: Diagnosis not present

## 2018-05-08 DIAGNOSIS — F329 Major depressive disorder, single episode, unspecified: Secondary | ICD-10-CM | POA: Insufficient documentation

## 2018-05-08 DIAGNOSIS — O99343 Other mental disorders complicating pregnancy, third trimester: Secondary | ICD-10-CM | POA: Insufficient documentation

## 2018-05-08 DIAGNOSIS — Z79899 Other long term (current) drug therapy: Secondary | ICD-10-CM | POA: Insufficient documentation

## 2018-05-08 DIAGNOSIS — O0990 Supervision of high risk pregnancy, unspecified, unspecified trimester: Secondary | ICD-10-CM

## 2018-05-08 DIAGNOSIS — O368130 Decreased fetal movements, third trimester, not applicable or unspecified: Secondary | ICD-10-CM | POA: Diagnosis present

## 2018-05-08 DIAGNOSIS — O368190 Decreased fetal movements, unspecified trimester, not applicable or unspecified: Secondary | ICD-10-CM | POA: Diagnosis present

## 2018-05-08 DIAGNOSIS — Z87891 Personal history of nicotine dependence: Secondary | ICD-10-CM | POA: Diagnosis not present

## 2018-05-08 NOTE — OB Triage Note (Signed)
Pt. reports zero fetal movement since 7 PM last night.  She tried laying on different sides, drinking juice and other sweet drinks, as well as hot and cold interventions.  G4P3 at [redacted]w[redacted]d.  Laurell Roof, RN

## 2018-05-08 NOTE — Discharge Summary (Signed)
See final progress note. 

## 2018-05-08 NOTE — Final Progress Note (Signed)
Physician Final Progress Note  Patient ID: Sonya Spencer MRN: [id number] DOB/AGE: May 17, 1986 32 y.o.  Admit date: 05/08/2018 Admitting provider: Will Bonnet, MD Discharge date: 05/08/2018   Admission Diagnoses:  1) intrauterine pregnancy at [redacted]w[redacted]d 2) decreased fetal movement.  Discharge Diagnoses:  1) intrauterine pregnancy at [redacted]w[redacted]d 2) decreased fetal movement.  History of Present Illness: The patient is a 32 y.o. female [phone] at [redacted]w[redacted]d who presents for decreased fetal movement since yesterday. However, upon arrival to the L&D unit she began to feel more consistent movement. She notes her usual, unchanged Braxton-Hicks contractions. She denies vaginal bleeding and leakage of fluid. She has no other acute concerns.   Hospital Course: the patient was admitted to labor and delivery for observation.  Her vitals were normal. Her fetal tracing was reassuring with a reactive NST.  She was reassured and has close follow up. She was discharged in stable and improved condition.   Past Medical History:  Diagnosis Date  . Allergy   . Anemia   . Anxiety   . Cholelithiasis   . Depression   . Fetal macrosomia during pregnancy in third trimester 03/25/2015   Resolved with delivery   . Frequent headaches   . Gestational diabetes   . H/O maternal third degree perineal laceration, currently pregnant   . History of Papanicolaou smear of cervix 10/10/11; 08/02/14   neg, ct neg; neg ct//gc/tr neg  . Supervision of normal pregnancy 11/09/2017   Clinic [facility] Prenatal Labs Dating  Blood type:    Genetic Screen 1 Screen:    AFP:     Quad:     NIPS: Antibody:  Anatomic [place]  Rubella:   Varicella:   GTT Early:               Third trimester:  RPR:    Rhogam  HBsAg:    TDaP vaccine                       Flu Shot: HIV:    Baby Food                                GBS:  Contraception  Pap: CBB    CS/VBAC   Support Person        . Urinary incontinence     Past Surgical History:  Procedure Laterality  Date  . CESAREAN SECTION N/A 03/22/2015   Procedure: CESAREAN SECTION;  Surgeon: Will Bonnet, MD;  Location: [facility] ORS;  Service: Obstetrics;  Laterality: N/A;    No current facility-administered medications on file prior to encounter.    Current Outpatient Medications on File Prior to Encounter  Medication Sig Dispense Refill  . ACCU-CHEK AVIVA PLUS test strip     . Calcium Polycarbophil (FIBER-CAPS PO) Take 1 capsule by mouth daily.    . cetirizine (ZYRTEC) 10 MG tablet Take 10 mg by mouth daily.    Marland Kitchen docusate sodium (COLACE) 100 MG capsule Take 100 mg by mouth daily.    . Doxylamine-Pyridoxine (DICLEGIS) 10-10 MG TBEC Take 2 tablets by mouth at bedtime. If symptoms persist, add one tablet in the morning and one in the afternoon 100 tablet 5  . escitalopram (LEXAPRO) 10 MG tablet Take 1 tablet (10 mg total) by mouth daily. 30 tablet 2  . Prenatal Vit-Fe Fumarate-FA (PRENATAL COMPLETE PO) Take 1 tablet by mouth daily.    . raNITIdine HCl ([facility]  RANITIDINE PO) Take 1 tablet by mouth daily.      Allergies  Allergen Reactions  . Nickel Rash  . Red Dye Rash    Includes Benadryl as it has red dye in it.    Social History   Socioeconomic History  . Marital status: Married    Spouse name: Not on file  . Number of children: 3  . Years of education: 44  . Highest education level: Not on file  Occupational History  . Occupation: HOMEMAKER  Social Needs  . Financial resource strain: Not on file  . Food insecurity:    Worry: Not on file    Inability: Not on file  . Transportation needs:    Medical: Not on file    Non-medical: Not on file  Tobacco Use  . Smoking status: Former Smoker    Packs/day: 0.25    Years: 2.00    Pack years: 0.50    Types: Cigarettes    Last attempt to quit: 03/13/2009    Years since quitting: 9.1  . Smokeless tobacco: Never Used  Substance and Sexual Activity  . Alcohol use: Not Currently    Comment: not during pregnancy  . Drug use: No  . Sexual  activity: Yes  Lifestyle  . Physical activity:    Days per week: Not on file    Minutes per session: Not on file  . Stress: Not on file  Relationships  . Social connections:    Talks on phone: Not on file    Gets together: Not on file    Attends religious service: Not on file    Active member of club or organization: Not on file    Attends meetings of clubs or organizations: Not on file    Relationship status: Not on file  . Intimate partner violence:    Fear of current or ex partner: Not on file    Emotionally abused: Not on file    Physically abused: Not on file    Forced sexual activity: Not on file  Other Topics Concern  . Not on file  Social History Narrative  . Not on file   Review of Systems  Constitutional: Negative.   HENT: Negative.   Eyes: Negative.   Respiratory: Negative.   Cardiovascular: Negative.   Gastrointestinal: Negative.   Genitourinary: Negative.   Musculoskeletal: Negative.   Skin: Negative.   Neurological: Negative.   Psychiatric/Behavioral: Negative.      Physical Exam: BP (!) 118/57 (BP Location: Right Arm)   Pulse 77   Temp 98.5 F (36.9 C) (Oral)   Resp 16   LMP 10/03/2017   Gen: NAD CV: RRR Pulm: CTAB Pelvic: deferred Ext: no e/c/e  Consults: None  Significant Findings/ Diagnostic Studies: n/a  Procedures: NST Baseline FHR: 135 beats/min Variability: moderate Accelerations: present Decelerations: absent Tocometry: quiet  Interpretation:  INDICATIONS: decreased fetal movement RESULTS:  A NST procedure was performed with FHR monitoring and a normal baseline established, appropriate time of 20-40 minutes of evaluation, and accels >2 seen w 15x15 characteristics.  Results show a REACTIVE NST.    Discharge Condition: stable  Disposition:  Discharge disposition: 01-Home or Self Care       Diet: Regular diet  Discharge Activity: Activity as tolerated   Allergies as of 05/08/2018      Reactions   Nickel Rash   Red  Dye Rash   Includes Benadryl as it has red dye in it.      Medication List  TAKE these medications   ACCU-CHEK AVIVA PLUS test strip Generic drug:  glucose blood   cetirizine 10 MG tablet Commonly known as:  ZYRTEC Take 10 mg by mouth daily.   [facility] RANITIDINE PO Take 1 tablet by mouth daily.   docusate sodium 100 MG capsule Commonly known as:  COLACE Take 100 mg by mouth daily.   Doxylamine-Pyridoxine 10-10 MG Tbec Take 2 tablets by mouth at bedtime. If symptoms persist, add one tablet in the morning and one in the afternoon   escitalopram 10 MG tablet Commonly known as:  LEXAPRO Take 1 tablet (10 mg total) by mouth daily.   FIBER-CAPS PO Take 1 capsule by mouth daily.   PRENATAL COMPLETE PO Take 1 tablet by mouth daily.      Follow-up Information    Will Bonnet, MD Follow up on 05/12/2018.   Specialty:  Obstetrics and Gynecology Why:  Keep previously schedule ultrasound and OB appointment Contact information: [address] [place] [address] [phone]           Total time spent taking care of this patient: 25 minutes  Signed: Prentice Docker, MD  05/08/2018, 7:02 PM

## 2018-05-12 ENCOUNTER — Ambulatory Visit (INDEPENDENT_AMBULATORY_CARE_PROVIDER_SITE_OTHER): Payer: Commercial Managed Care - PPO

## 2018-05-12 ENCOUNTER — Ambulatory Visit (INDEPENDENT_AMBULATORY_CARE_PROVIDER_SITE_OTHER): Payer: Medicaid Other | Admitting: Obstetrics and Gynecology

## 2018-05-12 VITALS — BP 112/58 | Wt 207.8 lb

## 2018-05-12 DIAGNOSIS — Z3A31 31 weeks gestation of pregnancy: Secondary | ICD-10-CM

## 2018-05-12 DIAGNOSIS — Z98891 History of uterine scar from previous surgery: Secondary | ICD-10-CM

## 2018-05-12 DIAGNOSIS — O34219 Maternal care for unspecified type scar from previous cesarean delivery: Secondary | ICD-10-CM

## 2018-05-12 DIAGNOSIS — O0990 Supervision of high risk pregnancy, unspecified, unspecified trimester: Secondary | ICD-10-CM

## 2018-05-12 DIAGNOSIS — O09299 Supervision of pregnancy with other poor reproductive or obstetric history, unspecified trimester: Secondary | ICD-10-CM

## 2018-05-12 DIAGNOSIS — O4443 Low lying placenta NOS or without hemorrhage, third trimester: Secondary | ICD-10-CM

## 2018-05-12 DIAGNOSIS — Z362 Encounter for other antenatal screening follow-up: Secondary | ICD-10-CM

## 2018-05-12 DIAGNOSIS — O4440 Low lying placenta NOS or without hemorrhage, unspecified trimester: Secondary | ICD-10-CM | POA: Insufficient documentation

## 2018-05-12 DIAGNOSIS — O24410 Gestational diabetes mellitus in pregnancy, diet controlled: Secondary | ICD-10-CM

## 2018-05-12 DIAGNOSIS — Z369 Encounter for antenatal screening, unspecified: Secondary | ICD-10-CM

## 2018-05-12 DIAGNOSIS — O24419 Gestational diabetes mellitus in pregnancy, unspecified control: Secondary | ICD-10-CM

## 2018-05-12 DIAGNOSIS — O09293 Supervision of pregnancy with other poor reproductive or obstetric history, third trimester: Secondary | ICD-10-CM

## 2018-05-12 LAB — POCT URINALYSIS DIPSTICK OB
GLUCOSE, UA: NEGATIVE — AB
POC,PROTEIN,UA: NEGATIVE

## 2018-05-12 NOTE — Progress Notes (Signed)
Routine Prenatal Care Visit  Subjective  Sonya Spencer is a 31 y.o. [phone] at [redacted]w[redacted]d being seen today for ongoing prenatal care.  She is currently monitored for the following issues for this high-risk pregnancy and has Anxiety and depression; Frequent headaches; Lipoma of axilla; History of macrosomia in infant in prior pregnancy, currently pregnant; Nausea and vomiting during pregnancy; History of cesarean delivery; Supervision of high risk pregnancy, antepartum; Abnormal glucose tolerance test (GTT) during pregnancy, antepartum; Gestational diabetes mellitus (GDM) affecting third pregnancy; Fall (on) (from) other stairs and steps, initial encounter; Decreased fetal movement; Indication for care in labor and delivery, antepartum; and Low-lying placenta on their problem list.  ----------------------------------------------------------------------------------- Patient reports no complaints.   Contractions: Not present. Vag. Bleeding: None.  Movement: Present. Denies leaking of fluid.  U/S shows growth at 58th%ile. AFI 20.8cm GDM: nearly every value normal.     ----------------------------------------------------------------------------------- The following portions of the patient's history were reviewed and updated as appropriate: allergies, current medications, past family history, past medical history, past social history, past surgical history and problem list. Problem list updated.  Objective  Blood pressure (!) 112/58, weight 207 lb 12 oz (94.2 kg), last menstrual period 10/03/2017, currently breastfeeding. Pregravid weight 199 lb (90.3 kg) Total Weight Gain 8 lb 12 oz (3.969 kg) Urinalysis:      Fetal Status: Fetal Heart Rate (bpm): Present   Movement: Present  Presentation: Pilar Plate Breech  General:  Alert, oriented and cooperative. Patient is in no acute distress.  Skin: Skin is warm and dry. No rash noted.   Cardiovascular: Normal heart rate noted  Respiratory: Normal respiratory  effort, no problems with respiration noted  Abdomen: Soft, gravid, appropriate for gestational age. Pain/Pressure: Absent     Pelvic:  Cervical exam deferred        Extremities: Normal range of motion.  Edema: None  Mental Status: Normal mood and affect. Normal behavior. Normal judgment and thought content.   US Ob Follow Up  Result Date: 05/12/2018 ULTRASOUND REPORT Patient Name: Sonya Spencer DOB: 19-Jun-1986 MRN: [id number] Location: [facility] Date of Service: 05/12/2018 Indications:growth/afi Findings: Nelda [place] intrauterine pregnancy is visualized with FHR at 145 BPM. Biometrics give an (U/S) Gestational age of [redacted]w[redacted]d and an (U/S) EDD of 07/02/2018; this correlates with the clinically established Estimated Date of Delivery: 07/10/18. Fetal presentation is Breech. Placenta: Posterior, grade 0. AFI: 20.92 cm Growth percentile is 58th. EFW: 1,939 grams/ 4lbs 4oz Impression: 1. [redacted]w[redacted]d Viable Singleton Intrauterine pregnancy previously established criteria. 2. Growth is 58th %ile.  AFI is 20.92 cm. Edwena Bunde, RDMS, RVT The ultrasound images and findings were reviewed by me and I agree with the above report. Prentice Docker, MD, [facility], [facility] 05/12/2018 10:40 AM    Assessment   32 y.o. [id number] at [redacted]w[redacted]d by  07/10/2018, by Last Menstrual Period presenting for routine prenatal visit  Plan   FOURTH Problems (from 11/09/17 to present)    Problem Noted Resolved   Low-lying placenta 05/12/2018 by Will Bonnet, MD No   Decreased fetal movement 05/08/2018 by Will Bonnet, MD No   Gestational diabetes mellitus (GDM) affecting third pregnancy 02/05/2018 by Homero Fellers, MD No   Abnormal glucose tolerance test (GTT) during pregnancy, antepartum 12/23/2017 by Will Bonnet, MD No   Overview Signed 12/23/2017  9:13 AM by Will Bonnet, MD    - pt check BG log.   - Lifestyles referral for presumptive GDM (also due to inordinate weight  loss early in pregnancy) - will treat as GDM.  Patient has refused 3 hour gtt.        Supervision of high risk pregnancy, antepartum 11/16/2017 by Rod Can, CNM No   Overview Addendum 12/16/2017 12:06 PM by Homero Fellers, MD    [facility]inic [facility] Prenatal Labs  Dating L=7 Blood type: A/Positive/-- (03/04 1111)   Genetic Screen Declines Antibody:Negative (03/04 1111)  Anatomic [place]  Rubella: 2.29 (03/04 1111) Varicella: Immune  GTT Early:149, 3 hr: DECLINED   Third trimester:  RPR: Non Reactive (03/04 1111)   Rhogam  not applicable HBsAg: Negative (03/04 1111)   TDaP vaccine                        Flu Shot: 05/19/2017 HIV: Non Reactive (03/04 1111)   Baby Food                                GBS:   Contraception  Pap: Needs   CBB     CS/VBAC  Desires VBAC   Support Person              Supervision of normal pregnancy 11/09/2017 by Rexene Agent, CNM 11/16/2017 by Rod Can, CNM   Overview Signed 11/09/2017 10:00 AM by Rexene Agent, [place] Prenatal Labs  Dating  Blood type:     Genetic Screen 1 Screen:    AFP:     Quad:     NIPS: Antibody:   Anatomic [place]  Rubella:   Varicella:    GTT Early:               Third trimester:  RPR:     Rhogam  HBsAg:     TDaP vaccine                       Flu Shot: HIV:     Baby Food                                GBS:   Contraception  Pap:  CBB     CS/VBAC    Support Person                 Preterm labor symptoms and general obstetric precautions including but not limited to vaginal bleeding, contractions, leaking of fluid and fetal movement were reviewed in detail with the patient. Please refer to After Visit Summary for other counseling recommendations.   Return in about 2 weeks (around 05/26/2018) for Routine Prenatal Appointment.  Prentice Docker, MD, [facility], [facility] 05/12/2018 10:59 AM

## 2018-05-26 ENCOUNTER — Ambulatory Visit (INDEPENDENT_AMBULATORY_CARE_PROVIDER_SITE_OTHER): Payer: Commercial Managed Care - PPO | Admitting: Obstetrics and Gynecology

## 2018-05-26 ENCOUNTER — Encounter: Payer: Self-pay | Admitting: Obstetrics and Gynecology

## 2018-05-26 VITALS — BP 108/62 | Wt 209.0 lb

## 2018-05-26 DIAGNOSIS — O34219 Maternal care for unspecified type scar from previous cesarean delivery: Secondary | ICD-10-CM

## 2018-05-26 DIAGNOSIS — O09299 Supervision of pregnancy with other poor reproductive or obstetric history, unspecified trimester: Secondary | ICD-10-CM

## 2018-05-26 DIAGNOSIS — O4440 Low lying placenta NOS or without hemorrhage, unspecified trimester: Secondary | ICD-10-CM

## 2018-05-26 DIAGNOSIS — Z98891 History of uterine scar from previous surgery: Secondary | ICD-10-CM

## 2018-05-26 DIAGNOSIS — O0990 Supervision of high risk pregnancy, unspecified, unspecified trimester: Secondary | ICD-10-CM

## 2018-05-26 DIAGNOSIS — O24419 Gestational diabetes mellitus in pregnancy, unspecified control: Secondary | ICD-10-CM

## 2018-05-26 DIAGNOSIS — O4443 Low lying placenta NOS or without hemorrhage, third trimester: Secondary | ICD-10-CM

## 2018-05-26 DIAGNOSIS — O24410 Gestational diabetes mellitus in pregnancy, diet controlled: Secondary | ICD-10-CM

## 2018-05-26 DIAGNOSIS — O09293 Supervision of pregnancy with other poor reproductive or obstetric history, third trimester: Secondary | ICD-10-CM

## 2018-05-26 DIAGNOSIS — Z3A33 33 weeks gestation of pregnancy: Secondary | ICD-10-CM

## 2018-05-26 LAB — POCT URINALYSIS DIPSTICK OB: Glucose, UA: NEGATIVE

## 2018-05-26 NOTE — Progress Notes (Signed)
Routine Prenatal Care Visit  Subjective  Sonya Spencer is a 32 y.o. [phone] at [redacted]w[redacted]d being seen today for ongoing prenatal care.  She is currently monitored for the following issues for this high-risk pregnancy and has Anxiety and depression; Frequent headaches; Lipoma of axilla; History of macrosomia in infant in prior pregnancy, currently pregnant; Nausea and vomiting during pregnancy; History of cesarean delivery; Supervision of high risk pregnancy, antepartum; Abnormal glucose tolerance test (GTT) during pregnancy, antepartum; Gestational diabetes mellitus (GDM) affecting third pregnancy; Fall (on) (from) other stairs and steps, initial encounter; Decreased fetal movement; Indication for care in labor and delivery, antepartum; and Low-lying placenta on their problem list.  ----------------------------------------------------------------------------------- Patient reports no complaints.   Contractions: Irregular. Vag. Bleeding: None.  Movement: Present. Denies leaking of fluid.  GDM: patient brought a picture of her BG log. All but one value was normal.   ----------------------------------------------------------------------------------- The following portions of the patient's history were reviewed and updated as appropriate: allergies, current medications, past family history, past medical history, past social history, past surgical history and problem list. Problem list updated.   Objective  Blood pressure 108/62, weight 209 lb (94.8 kg), last menstrual period 10/03/2017, currently breastfeeding. Pregravid weight 199 lb (90.3 kg) Total Weight Gain 10 lb (4.536 kg) Urinalysis:      Fetal Status: Fetal Heart Rate (bpm): 135 Fundal Height: 34 cm Movement: Present     General:  Alert, oriented and cooperative. Patient is in no acute distress.  Skin: Skin is warm and dry. No rash noted.   Cardiovascular: Normal heart rate noted  Respiratory: Normal respiratory effort, no problems with  respiration noted  Abdomen: Soft, gravid, appropriate for gestational age. Pain/Pressure: Absent     Pelvic:  Cervical exam deferred        Extremities: Normal range of motion.  Edema: None  Mental Status: Normal mood and affect. Normal behavior. Normal judgment and thought content.   Assessment   31 y.o. G4P3003 at [redacted]w[redacted]d by  07/10/2018, by Last Menstrual Period presenting for routine prenatal visit  Plan   FOURTH Problems (from 11/09/17 to present)    Problem Noted Resolved   Low-lying placenta 05/12/2018 by Will Bonnet, MD No   Decreased fetal movement 05/08/2018 by Will Bonnet, MD No   Gestational diabetes mellitus (GDM) affecting third pregnancy 02/05/2018 by Homero Fellers, MD No   Abnormal glucose tolerance test (GTT) during pregnancy, antepartum 12/23/2017 by Will Bonnet, MD No   Overview Signed 12/23/2017  9:13 AM by Will Bonnet, MD    - pt check BG log.   - Lifestyles referral for presumptive GDM (also due to inordinate weight loss early in pregnancy) - will treat as GDM.  Patient has refused 3 hour gtt.        Supervision of high risk pregnancy, antepartum 11/16/2017 by Rod Can, CNM No   Overview Addendum 12/16/2017 12:06 PM by Homero Fellers, MD    [facility]inic [facility] Prenatal Labs  Dating L=7 Blood type: A/Positive/-- (03/04 1111)   Genetic Screen Declines Antibody:Negative (03/04 1111)  Anatomic [place]  Rubella: 2.29 (03/04 1111) Varicella: Immune  GTT Early:149, 3 hr: DECLINED   Third trimester:  RPR: Non Reactive (03/04 1111)   Rhogam  not applicable HBsAg: Negative (03/04 1111)   TDaP vaccine                        Flu Shot: 05/19/2017 HIV: Non Reactive (03/04 1111)  Baby Food                                GBS:   Contraception  Pap: Needs   CBB     CS/VBAC  Desires VBAC   Support Person              Supervision of normal pregnancy 11/09/2017 by Rexene Agent, CNM 11/16/2017 by Rod Can, CNM   Overview Signed  11/09/2017 10:00 AM by Rexene Agent, [place] Prenatal Labs  Dating  Blood type:     Genetic Screen 1 Screen:    AFP:     Quad:     NIPS: Antibody:   Anatomic [place]  Rubella:   Varicella:    GTT Early:               Third trimester:  RPR:     Rhogam  HBsAg:     TDaP vaccine                       Flu Shot: HIV:     Baby Food                                GBS:   Contraception  Pap:  CBB     CS/VBAC    Support Person                 Preterm labor symptoms and general obstetric precautions including but not limited to vaginal bleeding, contractions, leaking of fluid and fetal movement were reviewed in detail with the patient. Please refer to After Visit Summary for other counseling recommendations.   Return in about 2 weeks (around 06/09/2018) for schedule u/s for growth/placenta location and routine prenatal after.  Prentice Docker, MD, [facility], [facility] 05/26/2018 9:28 AM

## 2018-05-27 ENCOUNTER — Observation Stay
Admission: EM | Admit: 2018-05-27 | Discharge: 2018-05-27 | Disposition: A | Discharge status: Home / Self Care | Payer: Commercial Managed Care - PPO | Attending: Obstetrics and Gynecology | Admitting: Obstetrics and Gynecology

## 2018-05-27 DIAGNOSIS — Z87891 Personal history of nicotine dependence: Secondary | ICD-10-CM | POA: Diagnosis not present

## 2018-05-27 DIAGNOSIS — R030 Elevated blood-pressure reading, without diagnosis of hypertension: Secondary | ICD-10-CM

## 2018-05-27 DIAGNOSIS — F419 Anxiety disorder, unspecified: Secondary | ICD-10-CM | POA: Insufficient documentation

## 2018-05-27 DIAGNOSIS — Z9109 Other allergy status, other than to drugs and biological substances: Secondary | ICD-10-CM | POA: Insufficient documentation

## 2018-05-27 DIAGNOSIS — O163 Unspecified maternal hypertension, third trimester: Secondary | ICD-10-CM | POA: Diagnosis not present

## 2018-05-27 DIAGNOSIS — Z79899 Other long term (current) drug therapy: Secondary | ICD-10-CM | POA: Insufficient documentation

## 2018-05-27 DIAGNOSIS — O9989 Other specified diseases and conditions complicating pregnancy, childbirth and the puerperium: Secondary | ICD-10-CM

## 2018-05-27 DIAGNOSIS — Z8632 Personal history of gestational diabetes: Secondary | ICD-10-CM | POA: Diagnosis not present

## 2018-05-27 DIAGNOSIS — Z3A33 33 weeks gestation of pregnancy: Secondary | ICD-10-CM | POA: Insufficient documentation

## 2018-05-27 MED ORDER — ACETAMINOPHEN 500 MG PO TABS
1000.0000 mg | ORAL_TABLET | ORAL | Status: AC
  Administered 2018-05-27: 1000 mg via ORAL
  Filled 2018-05-27: qty 2

## 2018-05-27 NOTE — OB Triage Note (Signed)
Pt presents with complaint of her BP being high today when she checked it at home and is complaining of a headache that is frontal and radiates all around head.  Pt states that she has not taken anything for it. Denies any leakage of fluid or contractions.

## 2018-05-27 NOTE — Discharge Summary (Signed)
See final progress note. 

## 2018-05-27 NOTE — Final Progress Note (Signed)
Physician Final Progress Note  Patient ID: Sonya Spencer MRN: [id number] DOB/AGE: 30/05/1986 30 y.o.  Admit date: 05/27/2018 Admitting provider: Will Bonnet, MD Discharge date: 05/27/2018   Admission Diagnoses:  1) intrauterine pregnancy at [redacted]w[redacted]d  2) elevated blood pressure reading at home  Discharge Diagnoses:  1) intrauterine pregnancy at [redacted]w[redacted]d  2) elevated blood pressure reading at home, no evidence of hypertension   History of Present Illness: The patient is a 32 y.o. female [phone] at [redacted]w[redacted]d who presents for several elevated blood pressures at home (up to SBPs 150s).  She had a headache, as well, and noted swelling in her upper extremities.  She notes +FM, no LOF, no vaginal bleeding, and no contractions.  She denies visual changes, and right upper quadrant pain.   Hospital Course:  The patient was admitted to L&D for observation. She had serial blood pressures evaluated.  All of which were well in the normal range.   Her other vital signs were normal.  She had a reactive fetal tracing.  She was given Tylenol for her headache and was reassured.  She was discharged home in stable condition.   Past Medical History:  Diagnosis Date  . Allergy   . Anemia   . Anxiety   . Cholelithiasis   . Depression   . Fetal macrosomia during pregnancy in third trimester 03/25/2015   Resolved with delivery   . Frequent headaches   . Gestational diabetes   . H/O maternal third degree perineal laceration, currently pregnant   . History of Papanicolaou smear of cervix 10/10/11; 08/02/14   neg, ct neg; neg ct//gc/tr neg  . Supervision of normal pregnancy 11/09/2017   [facility] Dating  Blood type:    Genetic Screen 1 Screen:    AFP:     Quad:     NIPS: Antibody:  Anatomic [place]  Rubella:   Varicella:   GTT Early:               Third trimester:  RPR:    Rhogam  HBsAg:    TDaP vaccine                       Flu Shot: HIV:    Baby Food                                GBS:   Contraception  Pap: CBB    CS/VBAC   Support Person        . Urinary incontinence     Past Surgical History:  Procedure Laterality Date  . CESAREAN SECTION N/A 03/22/2015   Procedure: CESAREAN SECTION;  Surgeon: Will Bonnet, MD;  Location: [facility] ORS;  Service: Obstetrics;  Laterality: N/A;    No current facility-administered medications on file prior to encounter.    Current Outpatient Medications on File Prior to Encounter  Medication Sig Dispense Refill  . ACCU-CHEK AVIVA PLUS test strip     . Calcium Polycarbophil (FIBER-CAPS PO) Take 1 capsule by mouth daily.    . cetirizine (ZYRTEC) 10 MG tablet Take 10 mg by mouth daily.    Marland Kitchen docusate sodium (COLACE) 100 MG capsule Take 100 mg by mouth daily.    Marland Kitchen escitalopram (LEXAPRO) 10 MG tablet Take 1 tablet (10 mg total) by mouth daily. 30 tablet 2  . FLUAD 0.5 ML SUSY TO BE ADMINISTERED BY PHARMACIST FOR IMMUNIZATION  0  .  Prenatal Vit-Fe Fumarate-FA (PRENATAL COMPLETE PO) Take 1 tablet by mouth daily.    . raNITIdine HCl ([facility] RANITIDINE PO) Take 1 tablet by mouth daily.    . Doxylamine-Pyridoxine (DICLEGIS) 10-10 MG TBEC Take 2 tablets by mouth at bedtime. If symptoms persist, add one tablet in the morning and one in the afternoon (Patient not taking: Reported on 05/26/2018) 100 tablet 5    Allergies  Allergen Reactions  . Nickel Rash  . Red Dye Rash    Includes Benadryl as it has red dye in it.    Social History   Socioeconomic History  . Marital status: Married    Spouse name: Not on file  . Number of children: 3  . Years of education: 44  . Highest education level: Not on file  Occupational History  . Occupation: HOMEMAKER  Social Needs  . Financial resource strain: Not on file  . Food insecurity:    Worry: Not on file    Inability: Not on file  . Transportation needs:    Medical: Not on file    Non-medical: Not on file  Tobacco Use  . Smoking status: Former Smoker    Packs/day: 0.25    Years: 2.00    Pack  years: 0.50    Types: Cigarettes    Last attempt to quit: 03/13/2009    Years since quitting: 9.2  . Smokeless tobacco: Never Used  Substance and Sexual Activity  . Alcohol use: Not Currently    Comment: not during pregnancy  . Drug use: No  . Sexual activity: Yes  Lifestyle  . Physical activity:    Days per week: Not on file    Minutes per session: Not on file  . Stress: Not on file  Relationships  . Social connections:    Talks on phone: Not on file    Gets together: Not on file    Attends religious service: Not on file    Active member of club or organization: Not on file    Attends meetings of clubs or organizations: Not on file    Relationship status: Not on file  . Intimate partner violence:    Fear of current or ex partner: Not on file    Emotionally abused: Not on file    Physically abused: Not on file    Forced sexual activity: Not on file  Other Topics Concern  . Not on file  Social History Narrative  . Not on file   Family History  Problem Relation Age of Onset  . Lupus Mother   . Hepatitis C Mother   . Depression Mother   . Diabetes type I Father   . Diabetes Father        type 1  . Thyroid disease Father   . Lupus Sister   . Lupus Maternal Grandmother   . Diabetes Maternal Grandmother   . Diabetes type I Daughter   . Diabetes Daughter   . Lupus Maternal Aunt   . Diabetes Paternal Grandfather        type 1  . Diabetes Paternal Aunt        type 1 - runs in father's side    Review of Systems  Constitutional: Negative.   HENT: Negative.   Eyes: Negative.   Respiratory: Negative.   Cardiovascular: Negative.   Gastrointestinal: Negative.   Genitourinary: Negative.   Musculoskeletal: Negative.   Skin: Negative.   Neurological: Positive for headaches. Negative for dizziness, tingling, tremors, sensory change, speech change,  focal weakness, seizures, loss of consciousness and weakness.  Psychiatric/Behavioral: Negative.      Physical Exam: BP  105/60   Pulse 84   Temp 98.3 F (36.8 C) (Oral)   Resp 18   Ht 5\' 3"  (1.6 m)   Wt 94.3 kg   LMP 10/03/2017   BMI 36.85 kg/m   BPs 102-121 / 54-68 Physical Exam  Constitutional: She is oriented to person, place, and time. She appears well-developed and well-nourished. No distress.  HENT:  Head: Normocephalic and atraumatic.  Eyes: Conjunctivae are normal. No scleral icterus.  Cardiovascular: Normal rate and regular rhythm.  Pulmonary/Chest: Effort normal. No respiratory distress.  Musculoskeletal: Normal range of motion. She exhibits no edema.  Neurological: She is alert and oriented to person, place, and time. She displays normal reflexes. No cranial nerve deficit.  Skin: Skin is warm and dry. No erythema.  Psychiatric: She has a normal mood and affect. Her behavior is normal. Judgment normal.   Consults: None  Significant Findings/ Diagnostic Studies: none  Procedures: NST Baseline FHR: 130 beats/min Variability: moderate Accelerations: present Decelerations: absent Tocometry: quiet  Interpretation:  INDICATIONS: Elevated blood pressure at home RESULTS:  A NST procedure was performed with FHR monitoring and a normal baseline established, appropriate time of 20-40 minutes of evaluation, and accels >2 seen w 15x15 characteristics.  Results show a REACTIVE NST.    Discharge Condition: stable  Disposition: Discharge disposition: 01-Home or Self Care       Diet: Regular diet  Discharge Activity: Activity as tolerated   Allergies as of 05/27/2018      Reactions   Nickel Rash   Red Dye Rash   Includes Benadryl as it has red dye in it.      Medication List    STOP taking these medications   Doxylamine-Pyridoxine 10-10 MG Tbec   FLUAD 0.5 ML Susy Generic drug:  Influenza Vac A&B Surf Ant Adj     TAKE these medications   ACCU-CHEK AVIVA PLUS test strip Generic drug:  glucose blood   cetirizine 10 MG tablet Commonly known as:  ZYRTEC Take 10 mg by mouth  daily.   CVS RANITIDINE PO Take 1 tablet by mouth daily.   docusate sodium 100 MG capsule Commonly known as:  COLACE Take 100 mg by mouth daily.   escitalopram 10 MG tablet Commonly known as:  LEXAPRO Take 1 tablet (10 mg total) by mouth daily.   FIBER-CAPS PO Take 1 capsule by mouth daily.   PRENATAL COMPLETE PO Take 1 tablet by mouth daily.      Follow-up Information    Will Bonnet, MD Follow up on 06/09/2018.   Specialty:  Obstetrics and Gynecology Why:  Keep previously schedule appointments at [facility] information: [place] [phone]          Total time spent taking care of this patient: 25 minutes  Signed: Prentice Docker, MD  05/27/2018, 4:44 PM

## 2018-06-09 ENCOUNTER — Telehealth: Payer: Self-pay

## 2018-06-09 ENCOUNTER — Ambulatory Visit (INDEPENDENT_AMBULATORY_CARE_PROVIDER_SITE_OTHER): Payer: Commercial Managed Care - PPO

## 2018-06-09 ENCOUNTER — Ambulatory Visit (INDEPENDENT_AMBULATORY_CARE_PROVIDER_SITE_OTHER): Payer: Commercial Managed Care - PPO | Admitting: Obstetrics and Gynecology

## 2018-06-09 ENCOUNTER — Encounter: Payer: Self-pay | Admitting: Obstetrics and Gynecology

## 2018-06-09 VITALS — BP 118/78 | Wt 211.0 lb

## 2018-06-09 DIAGNOSIS — O0990 Supervision of high risk pregnancy, unspecified, unspecified trimester: Secondary | ICD-10-CM

## 2018-06-09 DIAGNOSIS — Z3A35 35 weeks gestation of pregnancy: Secondary | ICD-10-CM | POA: Diagnosis not present

## 2018-06-09 DIAGNOSIS — O4440 Low lying placenta NOS or without hemorrhage, unspecified trimester: Secondary | ICD-10-CM

## 2018-06-09 DIAGNOSIS — Z98891 History of uterine scar from previous surgery: Secondary | ICD-10-CM

## 2018-06-09 DIAGNOSIS — O09293 Supervision of pregnancy with other poor reproductive or obstetric history, third trimester: Secondary | ICD-10-CM | POA: Diagnosis not present

## 2018-06-09 DIAGNOSIS — O24419 Gestational diabetes mellitus in pregnancy, unspecified control: Secondary | ICD-10-CM

## 2018-06-09 DIAGNOSIS — O09299 Supervision of pregnancy with other poor reproductive or obstetric history, unspecified trimester: Secondary | ICD-10-CM

## 2018-06-09 DIAGNOSIS — O4443 Low lying placenta NOS or without hemorrhage, third trimester: Secondary | ICD-10-CM | POA: Diagnosis not present

## 2018-06-09 DIAGNOSIS — O24410 Gestational diabetes mellitus in pregnancy, diet controlled: Secondary | ICD-10-CM | POA: Diagnosis not present

## 2018-06-09 DIAGNOSIS — F329 Major depressive disorder, single episode, unspecified: Secondary | ICD-10-CM

## 2018-06-09 DIAGNOSIS — F419 Anxiety disorder, unspecified: Secondary | ICD-10-CM

## 2018-06-09 LAB — POCT URINALYSIS DIPSTICK OB
GLUCOSE, UA: NEGATIVE
POC,PROTEIN,UA: NEGATIVE

## 2018-06-09 MED ORDER — "INSULIN SYRINGE 30G X 1/2"" 1 ML MISC": 5 refills | Status: DC

## 2018-06-09 MED ORDER — INSULIN DETEMIR 100 UNIT/ML ~~LOC~~ SOLN: 10.0000 [IU] | Freq: Every day | SUBCUTANEOUS | 5 refills | Status: DC

## 2018-06-09 MED ORDER — INSULIN ASPART 100 UNIT/ML ~~LOC~~ SOLN: 6.0000 [IU] | Freq: Three times a day (TID) | SUBCUTANEOUS | 5 refills | Status: DC

## 2018-06-09 NOTE — Telephone Encounter (Signed)
[facility] [place] calling.  Has received insulin rxs.  Needs rx for syringes eRx'd.  [phone]

## 2018-06-09 NOTE — Telephone Encounter (Signed)
Done. Syringes sent.

## 2018-06-09 NOTE — Progress Notes (Signed)
Routine Prenatal Care Visit  Subjective  Sonya Spencer is a 32 y.o. [phone] at [redacted]w[redacted]d being seen today for ongoing prenatal care.  She is currently monitored for the following issues for this high-risk pregnancy and has Anxiety and depression; Frequent headaches; Lipoma of axilla; History of macrosomia in infant in prior pregnancy, currently pregnant; Nausea and vomiting during pregnancy; History of cesarean delivery; Supervision of high risk pregnancy, antepartum; Abnormal glucose tolerance test (GTT) during pregnancy, antepartum; Gestational diabetes mellitus (GDM) affecting third pregnancy; Fall (on) (from) other stairs and steps, initial encounter; Decreased fetal movement; Indication for care in labor and delivery, antepartum; and Low-lying placenta on their problem list.  ----------------------------------------------------------------------------------- Patient reports notes elevated BG levels. Brings log in. See below..   Contractions: Irregular. Vag. Bleeding: None.  Movement: Present. Denies leaking of fluid.  U/s shows growth 83.4%ile, BPD and HC >97%ile. AFI 12.8 cm.  BG log: (normal in weeks past)   ----------------------------------------------------------------------------------- The following portions of the patient's history were reviewed and updated as appropriate: allergies, current medications, past family history, past medical history, past social history, past surgical history and problem list. Problem list updated.   Objective  Blood pressure 118/78, weight 211 lb (95.7 kg), last menstrual period 10/03/2017, currently breastfeeding. Pregravid weight 199 lb (90.3 kg) Total Weight Gain 12 lb (5.443 kg) Urinalysis: Urine Protein Negative  Urine Glucose Negative  Fetal Status:     Movement: Present     General:  Alert, oriented and cooperative. Patient is in no acute distress.  Skin: Skin is warm and dry. No rash noted.   Cardiovascular: Normal heart rate noted   Respiratory: Normal respiratory effort, no problems with respiration noted  Abdomen: Soft, gravid, appropriate for gestational age. Pain/Pressure: Present     Pelvic:  Cervical exam deferred        Extremities: Normal range of motion.     Mental Status: Normal mood and affect. Normal behavior. Normal judgment and thought content.   US Ob Follow Up  Result Date: 06/09/2018 Patient Name: Sonya Spencer DOB: 1986/09/21 MRN: [id number] ULTRASOUND REPORT Location: [facility] Date of Service: 06/09/2018 Indications:growth/afi Findings: Nelda [place] intrauterine pregnancy is visualized with FHR at 138 BPM. Biometrics give an (U/S) Gestational age of [redacted]w[redacted]d and an (U/S) EDD of 06/22/18; this correlates with the clinically established Estimated Date of Delivery: 07/10/18. Fetal presentation is Cephalic. Placenta: posterior. Grade: 1, Placenta to cervical IO shortest distance by TV US = 3.34cm AFI: 12.81 cm Growth percentile is 83.4%., BPD and HC >97% EFW: 7 lb 3 oz / 3273 grams Impression: 1. [redacted]w[redacted]d Viable Singleton Intrauterine pregnancy previously established criteria. 2. Growth is 83 %ile.  AFI is 12.81 cm. Abeer Alsammarraie,RDMS The ultrasound images and findings were reviewed by me and I agree with the above report. Prentice Docker, MD, [facility], [facility] 06/09/2018 10:55 AM      Assessment   32 y.o. [id number] at [redacted]w[redacted]d by  07/10/2018, by Last Menstrual Period presenting for routine prenatal visit  Plan   FOURTH Problems (from 11/09/17 to present)    Problem Noted Resolved   Low-lying placenta 05/12/2018 by Will Bonnet, MD No   Overview Addendum 06/09/2018 10:56 AM by Will Bonnet, MD    Posterior-Resolved 35 weeks      Decreased fetal movement 05/08/2018 by Will Bonnet, MD No   Gestational diabetes mellitus (GDM) affecting third pregnancy 02/05/2018 by Homero Fellers, MD No   Abnormal glucose tolerance test (GTT) during  pregnancy, antepartum  12/23/2017 by Will Bonnet, MD No   Overview Signed 12/23/2017  9:13 AM by Will Bonnet, MD    - pt check BG log.   - Lifestyles referral for presumptive GDM (also due to inordinate weight loss early in pregnancy) - will treat as GDM.  Patient has refused 3 hour gtt.        Supervision of high risk pregnancy, antepartum 11/16/2017 by Rod Can, CNM No   Overview Addendum 12/16/2017 12:06 PM by Homero Fellers, MD    [facility] P[facility]atal [facility]  Dating L=7 Blood type: A/Positive/-- (03/04 1111)   Genetic Screen Declines Antibody:Negative (03/04 1111)  Anatomic [place]  Rubella: 2.29 (03/04 1111) Varicella: Immune  GTT Early:149, 3 hr: DECLINED   Third trimester:  RPR: Non Reactive (03/04 1111)   Rhogam  not applicable HBsAg: Negative (03/04 1111)   TDaP vaccine                        Flu Shot: 05/19/2017 HIV: Non Reactive (03/04 1111)   Baby Food                                GBS:   Contraception  Pap: Needs   CBB     CS/VBAC  Desires VBAC   Support Person              Supervision of normal pregnancy 11/09/2017 by Rexene Agent, CNM 11/16/2017 by Rod Can, CNM   Overview Signed 11/09/2017 10:00 AM by Rexene Agent, [facility]atal [facility]  Dating  Blood type:     Genetic Screen 1 Screen:    AFP:     Quad:     NIPS: Antibody:   Anatomic [place]  Rubella:   Varicella:    GTT Early:               Third trimester:  RPR:     Rhogam  HBsAg:     TDaP vaccine                       Flu Shot: HIV:     Baby Food                                GBS:   Contraception  Pap:  CBB     CS/VBAC    Support Person                  Preterm labor symptoms and general obstetric precautions including but not limited to vaginal bleeding, contractions, leaking of fluid and fetal movement were reviewed in detail with the patient. Please refer to After Visit Summary for other counseling recommendations.   Growth - continue to monitor MOD: patient requests  repeat c-section given measurements today. Will schedule based on response to insulin. GDMA2: starting insulin with levemir 10 units QHS, 6 units 2h pp each meal.  She will send me BG values through MyChart in a couple of days. Move to twice weekly monitoring given need to start medication. She is already trained in the administration of insulin given that her daughter is a T1 diabetic.   Return in about 5 days (around 06/14/2018) for ROB/NST, schedule u/s with ROB/NST for 8 days.  Prentice Docker, MD, [facility],  [facility] 06/09/2018 10:23 PM

## 2018-06-11 ENCOUNTER — Observation Stay
Admission: EM | Admit: 2018-06-11 | Discharge: 2018-06-11 | Disposition: A | Discharge status: Home / Self Care | Payer: Commercial Managed Care - PPO | Attending: Obstetrics and Gynecology | Admitting: Obstetrics and Gynecology

## 2018-06-11 ENCOUNTER — Telehealth: Payer: Self-pay

## 2018-06-11 ENCOUNTER — Other Ambulatory Visit: Payer: Self-pay

## 2018-06-11 DIAGNOSIS — Z888 Allergy status to other drugs, medicaments and biological substances status: Secondary | ICD-10-CM | POA: Insufficient documentation

## 2018-06-11 DIAGNOSIS — O26893 Other specified pregnancy related conditions, third trimester: Principal | ICD-10-CM | POA: Insufficient documentation

## 2018-06-11 DIAGNOSIS — Z87891 Personal history of nicotine dependence: Secondary | ICD-10-CM | POA: Diagnosis not present

## 2018-06-11 DIAGNOSIS — Z794 Long term (current) use of insulin: Secondary | ICD-10-CM | POA: Insufficient documentation

## 2018-06-11 DIAGNOSIS — Z3A35 35 weeks gestation of pregnancy: Secondary | ICD-10-CM | POA: Diagnosis not present

## 2018-06-11 DIAGNOSIS — Z79899 Other long term (current) drug therapy: Secondary | ICD-10-CM | POA: Insufficient documentation

## 2018-06-11 DIAGNOSIS — R109 Unspecified abdominal pain: Secondary | ICD-10-CM | POA: Diagnosis not present

## 2018-06-11 DIAGNOSIS — O471 False labor at or after 37 completed weeks of gestation: Secondary | ICD-10-CM | POA: Diagnosis not present

## 2018-06-11 NOTE — Discharge Summary (Signed)
Physician Final Progress Note  Patient ID: Sonya Spencer MRN: [id number] DOB/AGE: 1986-04-11 33 y.o.  Admit date: 06/11/2018 Admitting provider: Will Bonnet, MD Discharge date: 06/11/2018   Admission Diagnoses: fluid leaking, pain at c/section scar  Discharge Diagnoses:  Active Problems:   Indication for care in labor and delivery, antepartum 32 yo G4 P3003 at 35 weeks 6 days with reactive NST, not in labor, membranes intact  History of Present Illness: The patient is a 32 y.o. female [phone] at [redacted]w[redacted]d who presents for a small amount of fluid leaking around lunch time. She was also having sharp pains in the area of her previous c/section incision. She felt the fluid leak into the toilet prior to peeing and then was able to empty her bladder. She has not felt any more leaking. She has continued to feel irregular pains in her lower abdomen and in her lower back. She admits positive fetal movement. She denies any vaginal bleeding. She also mentioned that she felt a little dizzy and she has some congestion/sinus pressure. Her blood sugar at last check after lunch was 160. The plan of care she has made with Dr Glennon Mac is to work on adjusting her insulin for the next week to see if she can control her blood sugar. If it is well controlled the plan will be for repeat c/section at 39 weeks. If blood sugar is not well controlled the plan will be c/section in week 37. She has a history of fetal macrosomia.   Past Medical History:  Diagnosis Date  . Allergy   . Anemia   . Anxiety   . Cholelithiasis   . Depression   . Fetal macrosomia during pregnancy in third trimester 03/25/2015   Resolved with delivery   . Frequent headaches   . Gestational diabetes   . H/O maternal third degree perineal laceration, currently pregnant   . History of Papanicolaou smear of cervix 10/10/11; 08/02/14   neg, ct neg; neg ct//gc/tr neg  . Supervision of normal pregnancy 11/09/2017   [facility]inic [facility]  Dating  Blood type:    Genetic Screen 1 Screen:    AFP:     Quad:     NIPS: Antibody:  Anatomic [place]  Rubella:   Varicella:   GTT Early:               Third trimester:  RPR:    Rhogam  HBsAg:    TDaP vaccine                       Flu Shot: HIV:    Baby Food                                GBS:  Contraception  Pap: CBB    CS/VBAC   Support Person        . Urinary incontinence     Past Surgical History:  Procedure Laterality Date  . CESAREAN SECTION N/A 03/22/2015   Procedure: CESAREAN SECTION;  Surgeon: Will Bonnet, MD;  Location: [facility] ORS;  Service: Obstetrics;  Laterality: N/A;    No current facility-administered medications on file prior to encounter.    Current Outpatient Medications on File Prior to Encounter  Medication Sig Dispense Refill  . ACCU-CHEK AVIVA PLUS test strip     . Calcium Polycarbophil (FIBER-CAPS PO) Take 1 capsule by mouth daily.    . cetirizine (ZYRTEC)  10 MG tablet Take 10 mg by mouth daily.    Marland Kitchen docusate sodium (COLACE) 100 MG capsule Take 100 mg by mouth daily.    Marland Kitchen escitalopram (LEXAPRO) 10 MG tablet Take 1 tablet (10 mg total) by mouth daily. 30 tablet 2  . insulin aspart (NOVOLOG) 100 UNIT/ML injection Inject 6 Units into the skin 3 (three) times daily before meals. 10 mL 5  . insulin detemir (LEVEMIR) 100 UNIT/ML injection Inject 0.1 mLs (10 Units total) into the skin at bedtime. 10 mL 5  . Insulin Syringe-Needle U-100 (INSULIN SYRINGE 1CC/30GX1/2") 30G X 1/2" 1 ML MISC Use as directed for insulin administration 100 each 5  . Prenatal Vit-Fe Fumarate-FA (PRENATAL COMPLETE PO) Take 1 tablet by mouth daily.    . raNITIdine HCl (CVS RANITIDINE PO) Take 1 tablet by mouth daily.      Allergies  Allergen Reactions  . Nickel Rash  . Red Dye Rash    Includes Benadryl as it has red dye in it.    Social History   Socioeconomic History  . Marital status: Married    Spouse name: Not on file  . Number of children: 3  . Years of education: 32  . Highest  education level: Not on file  Occupational History  . Occupation: HOMEMAKER  Social Needs  . Financial resource strain: Not on file  . Food insecurity:    Worry: Not on file    Inability: Not on file  . Transportation needs:    Medical: Not on file    Non-medical: Not on file  Tobacco Use  . Smoking status: Former Smoker    Packs/day: 0.25    Years: 2.00    Pack years: 0.50    Types: Cigarettes    Last attempt to quit: 03/13/2009    Years since quitting: 9.2  . Smokeless tobacco: Never Used  Substance and Sexual Activity  . Alcohol use: Not Currently    Comment: not during pregnancy  . Drug use: No  . Sexual activity: Yes  Lifestyle  . Physical activity:    Days per week: Not on file    Minutes per session: Not on file  . Stress: Not on file  Relationships  . Social connections:    Talks on phone: Not on file    Gets together: Not on file    Attends religious service: Not on file    Active member of club or organization: Not on file    Attends meetings of clubs or organizations: Not on file    Relationship status: Not on file  . Intimate partner violence:    Fear of current or ex partner: Not on file    Emotionally abused: Not on file    Physically abused: Not on file    Forced sexual activity: Not on file  Other Topics Concern  . Not on file  Social History Narrative  . Not on file    Physical Exam: BP (!) 119/53   Pulse 99   Temp 98.2 F (36.8 C) (Oral)   LMP 10/03/2017   Gen: NAD CV: RRR Pulm: CTAB Pelvic: sterile speculum exam, normal vaginal mucosa, cervix appears closed, thin white discharge present,  negative for pooling, negative nitrazine Fetal well being: 135 bpm, moderate variability, + accelerations, - decelerations Toco: rare contraction, irritability noted Ext: trace edema  Consults: None  Significant Findings/ Diagnostic Studies: none  Procedures: NST  Discharge Condition: good  Disposition: Discharge disposition: 01-Home or Self  Care  Diet: low carb, diabetic healthy diet  Discharge Activity: Activity as tolerated  Discharge Instructions    Discharge activity:  No Restrictions   Complete by:  As directed    Discharge diet:  No restrictions   Complete by:  As directed    Fetal Kick Count:  Lie on our left side for one hour after a meal, and count the number of times your baby kicks.  If it is less than 5 times, get up, move around and drink some juice.  Repeat the test 30 minutes later.  If it is still less than 5 kicks in an hour, notify your doctor.   Complete by:  As directed    No sexual activity restrictions   Complete by:  As directed    Notify physician for a general feeling that "something is not right"   Complete by:  As directed    Notify physician for increase or change in vaginal discharge   Complete by:  As directed    Notify physician for intestinal cramps, with or without diarrhea, sometimes described as "gas pain"   Complete by:  As directed    Notify physician for leaking of fluid   Complete by:  As directed    Notify physician for low, dull backache, unrelieved by heat or Tylenol   Complete by:  As directed    Notify physician for menstrual like cramps   Complete by:  As directed    Notify physician for pelvic pressure   Complete by:  As directed    Notify physician for uterine contractions.  These may be painless and feel like the uterus is tightening or the baby is  "balling up"   Complete by:  As directed    Notify physician for vaginal bleeding   Complete by:  As directed    PRETERM LABOR:  Includes any of the follwing symptoms that occur between 20 - [redacted] weeks gestation.  If these symptoms are not stopped, preterm labor can result in preterm delivery, placing your baby at risk   Complete by:  As directed      Allergies as of 06/11/2018      Reactions   Nickel Rash   Red Dye Rash   Includes Benadryl as it has red dye in it.      Medication List    TAKE these  medications   ACCU-CHEK AVIVA PLUS test strip Generic drug:  glucose blood   cetirizine 10 MG tablet Commonly known as:  ZYRTEC Take 10 mg by mouth daily.   [facility] RANITIDINE PO Take 1 tablet by mouth daily.   docusate sodium 100 MG capsule Commonly known as:  COLACE Take 100 mg by mouth daily.   escitalopram 10 MG tablet Commonly known as:  LEXAPRO Take 1 tablet (10 mg total) by mouth daily.   FIBER-CAPS PO Take 1 capsule by mouth daily.   insulin aspart 100 UNIT/ML injection Commonly known as:  novoLOG Inject 6 Units into the skin 3 (three) times daily before meals.   insulin detemir 100 UNIT/ML injection Commonly known as:  LEVEMIR Inject 0.1 mLs (10 Units total) into the skin at bedtime.   INSULIN SYRINGE 1CC/30GX1/2" 30G X 1/2" 1 ML Misc Use as directed for insulin administration   PRENATAL COMPLETE PO Take 1 tablet by mouth daily.      [place]. Go to.   Specialty:  Obstetrics and Gynecology Why:  regular scheduled prenatal appointment Contact information:  [place] [phone] [phone]          Total time spent taking care of this patient: 20 minutes  Signed: Rod Can, CNM  06/11/2018, 5:30 PM

## 2018-06-11 NOTE — OB Triage Note (Signed)
Pt c/o ctx and leaking fluid since lunch today.

## 2018-06-11 NOTE — Telephone Encounter (Signed)
FMLA/DISABILITY form for [facility] for FOB, Sonya Spencer, Sonya Spencer, filled out, signature obtained and given to [place] for processing.

## 2018-06-14 ENCOUNTER — Ambulatory Visit (INDEPENDENT_AMBULATORY_CARE_PROVIDER_SITE_OTHER): Payer: Commercial Managed Care - PPO | Admitting: Obstetrics and Gynecology

## 2018-06-14 ENCOUNTER — Telehealth: Payer: Self-pay | Admitting: Obstetrics and Gynecology

## 2018-06-14 VITALS — BP 130/70 | Wt 209.0 lb

## 2018-06-14 DIAGNOSIS — O09299 Supervision of pregnancy with other poor reproductive or obstetric history, unspecified trimester: Secondary | ICD-10-CM

## 2018-06-14 DIAGNOSIS — O09293 Supervision of pregnancy with other poor reproductive or obstetric history, third trimester: Secondary | ICD-10-CM

## 2018-06-14 DIAGNOSIS — Z3A36 36 weeks gestation of pregnancy: Secondary | ICD-10-CM

## 2018-06-14 DIAGNOSIS — O24415 Gestational diabetes mellitus in pregnancy, controlled by oral hypoglycemic drugs: Secondary | ICD-10-CM | POA: Diagnosis not present

## 2018-06-14 DIAGNOSIS — Z98891 History of uterine scar from previous surgery: Secondary | ICD-10-CM

## 2018-06-14 DIAGNOSIS — O24419 Gestational diabetes mellitus in pregnancy, unspecified control: Secondary | ICD-10-CM

## 2018-06-14 DIAGNOSIS — O34219 Maternal care for unspecified type scar from previous cesarean delivery: Secondary | ICD-10-CM

## 2018-06-14 LAB — POCT URINALYSIS DIPSTICK OB: Glucose, UA: NEGATIVE

## 2018-06-14 LAB — GLUCOSE, POCT (MANUAL RESULT ENTRY): POC Glucose: 142 mg/dl — AB (ref 70–99)

## 2018-06-14 NOTE — Progress Notes (Signed)
ROB NST Declines cervical exam

## 2018-06-14 NOTE — Progress Notes (Signed)
Routine Prenatal Care Visit  Subjective  Sonya Spencer is a 32 y.o. [phone] at [redacted]w[redacted]d being seen today for ongoing prenatal care.  She is currently monitored for the following issues for this high-risk pregnancy and has Anxiety and depression; Frequent headaches; Lipoma of axilla; History of macrosomia in infant in prior pregnancy, currently pregnant; Nausea and vomiting during pregnancy; History of cesarean delivery; Supervision of high risk pregnancy, antepartum; Abnormal glucose tolerance test (GTT) during pregnancy, antepartum; Gestational diabetes mellitus (GDM) affecting third pregnancy; Fall (on) (from) other stairs and steps, initial encounter; Decreased fetal movement; Indication for care in labor and delivery, antepartum; and Low-lying placenta on their problem list.  ----------------------------------------------------------------------------------- Patient reports no complaints.   Contractions: Irregular. Vag. Bleeding: None.  Movement: Present. Denies leaking of fluid.  ----------------------------------------------------------------------------------- The following portions of the patient's history were reviewed and updated as appropriate: allergies, current medications, past family history, past medical history, past social history, past surgical history and problem list. Problem list updated.   Objective  Blood pressure 130/70, weight 209 lb (94.8 kg), last menstrual period 10/03/2017, currently breastfeeding. Pregravid weight 199 lb (90.3 kg) Total Weight Gain 10 lb (4.536 kg) Urinalysis:      Fetal Status: Fetal Heart Rate (bpm): 140   Movement: Present     General:  Alert, oriented and cooperative. Patient is in no acute distress.  Skin: Skin is warm and dry. No rash noted.   Cardiovascular: Normal heart rate noted  Respiratory: Normal respiratory effort, no problems with respiration noted  Abdomen: Soft, gravid, appropriate for gestational age. Pain/Pressure: Absent      Pelvic:  Cervical exam deferred        Extremities: Normal range of motion.     ental Status: Normal mood and affect. Normal behavior. Normal judgment and thought content.   Baseline: 140 Variability: moderate Accelerations: present Decelerations: absent Tocometry: N/A The patient was monitored for 30 minutes, fetal heart rate tracing was deemed reactive, category I tracing,  CPT [phone]   Assessment   32 y.o. [id number] at [redacted]w[redacted]d by  07/10/2018, by Last Menstrual Period presenting for routine prenatal visit  Plan   FOURTH Problems (from 11/09/17 to present)    Problem Noted Resolved   Low-lying placenta 05/12/2018 by Will Bonnet, MD No   Overview Addendum 06/09/2018 10:56 AM by Will Bonnet, MD    Posterior-Resolved 35 weeks      Decreased fetal movement 05/08/2018 by Will Bonnet, MD No   Gestational diabetes mellitus (GDM) affecting third pregnancy 02/05/2018 by Homero Fellers, MD No   Abnormal glucose tolerance test (GTT) during pregnancy, antepartum 12/23/2017 by Will Bonnet, MD No   Overview Signed 12/23/2017  9:13 AM by Will Bonnet, MD    - pt check BG log.   - Lifestyles referral for presumptive GDM (also due to inordinate weight loss early in pregnancy) - will treat as GDM.  Patient has refused 3 hour gtt.        Supervision of high risk pregnancy, antepartum 11/16/2017 by Rod Can, CNM No   Overview Addendum 12/16/2017 12:06 PM by Homero Fellers, MD    [facility]inic [facility] Prenatal Labs  Dating L=7 Blood type: A/Positive/-- (03/04 1111)   Genetic Screen Declines Antibody:Negative (03/04 1111)  Anatomic [place]  Rubella: 2.29 (03/04 1111) Varicella: Immune  GTT Early:149, 3 hr: DECLINED   Third trimester:  RPR: Non Reactive (03/04 1111)   Rhogam  not applicable HBsAg: Negative (03/04 1111)  TDaP vaccine                        Flu Shot: 05/19/2017 HIV: Non Reactive (03/04 1111)   Baby Food                                GBS:     Contraception  Pap: Needs   CBB     CS/VBAC  Desires VBAC   Support Person               Gestational age appropriate obstetric precautions including but not limited to vaginal bleeding, contractions, leaking of fluid and fetal movement were reviewed in detail with the patient.    1) GDM - increase levemir to 20 Units qhs, maintain meal time insulin at 6 units.   - IOL 06/24/2018 - Fingerstick 142 today in clinic  2) GBS - declined GBS today wants to wait till thursday  3)  Return in about 3 days (around 06/17/2018) for ROB/NST/AFI Glennon Mac.   Malachy Mood, MD, [facility], [facility] 06/14/2018, 10:10 AM

## 2018-06-14 NOTE — Telephone Encounter (Signed)
-----   Message from Will Bonnet, MD sent at 06/14/2018  1:30 PM EDT ----- Regarding: FW: Schedule surgery Reschedule surgery to Thursday around noon-ish, depending on OR schedule. Please confirm. Thank you!  ----- Message ----- From: Will Bonnet, MD Sent: 06/14/2018  10:58 AM EDT To: [place] [facility] Subject: Schedule surgery                               Surgery Booking Request Patient Full Name:  Sonya Spencer  MRN: [id number]  DOB: [birth year]  Surgeon: Prentice Docker, MD  Requested Surgery Date and Time: 06/23/18 @ 1230pm Primary Diagnosis AND Code: uncontrolled gestational diabetes, history of c-section, desires repeat, desires permanent sterilization Secondary Diagnosis and Code:  Surgical Procedure: Cesarean section with tubal ligation L&D Notification: Yes Admission Status: surgery admit Length of Surgery: 60 min Special Case Needs: none H&P: TBD (date) Phone Interview???: no Interpreter: Language:  Medical Clearance: no Special Scheduling Instructions: needs to be at 1230 PM. Verify with the OR. If can not do on this date at this time, then will need to move surgery to different date.

## 2018-06-14 NOTE — Patient Instructions (Signed)
Levemir increase to 20 units nightly Keep meal time insulin at 6 units prior to meals

## 2018-06-14 NOTE — Addendum Note (Signed)
Addended by: [place], Aveena Bari B on: 06/14/2018 10:14 AM   Modules accepted: Orders

## 2018-06-14 NOTE — Telephone Encounter (Signed)
Patient is aware of H&P at [facility] on 06/23/18 @ 9:30am w/ Dr. Glennon Mac, Pre-admit Testing at 11:15am, and OR on 06/24/18. Patient is aware she may receive calls from the [facility] and [facility]. Patient confirmed [employer] and secondary Medicaid.

## 2018-06-17 ENCOUNTER — Ambulatory Visit (INDEPENDENT_AMBULATORY_CARE_PROVIDER_SITE_OTHER): Payer: Commercial Managed Care - PPO

## 2018-06-17 ENCOUNTER — Ambulatory Visit (INDEPENDENT_AMBULATORY_CARE_PROVIDER_SITE_OTHER): Payer: Commercial Managed Care - PPO | Admitting: Obstetrics & Gynecology

## 2018-06-17 VITALS — BP 100/60 | Wt 209.0 lb

## 2018-06-17 DIAGNOSIS — Z3A36 36 weeks gestation of pregnancy: Secondary | ICD-10-CM

## 2018-06-17 DIAGNOSIS — O09299 Supervision of pregnancy with other poor reproductive or obstetric history, unspecified trimester: Secondary | ICD-10-CM

## 2018-06-17 DIAGNOSIS — O0990 Supervision of high risk pregnancy, unspecified, unspecified trimester: Secondary | ICD-10-CM

## 2018-06-17 DIAGNOSIS — O24419 Gestational diabetes mellitus in pregnancy, unspecified control: Secondary | ICD-10-CM

## 2018-06-17 DIAGNOSIS — O09293 Supervision of pregnancy with other poor reproductive or obstetric history, third trimester: Secondary | ICD-10-CM | POA: Diagnosis not present

## 2018-06-17 DIAGNOSIS — O24414 Gestational diabetes mellitus in pregnancy, insulin controlled: Secondary | ICD-10-CM | POA: Diagnosis not present

## 2018-06-17 DIAGNOSIS — O99810 Abnormal glucose complicating pregnancy: Secondary | ICD-10-CM

## 2018-06-17 LAB — POCT URINALYSIS DIPSTICK OB: GLUCOSE, UA: NEGATIVE

## 2018-06-17 NOTE — Addendum Note (Signed)
Addended by: Gae Dry on: 06/17/2018 11:44 AM   Modules accepted: Orders

## 2018-06-17 NOTE — Patient Instructions (Addendum)

## 2018-06-17 NOTE — Progress Notes (Signed)
  Subjective  Fetal Movement? yes Contractions? no Leaking Fluid? no Vaginal Bleeding? no  Objective  BP 100/60   Wt 209 lb (94.8 kg)   LMP 10/03/2017   BMI 37.02 kg/m  General: NAD Pumonary: no increased work of breathing Abdomen: gravid, non-tender Extremities: no edema Psychiatric: mood appropriate, affect full  Assessment  32 y.o. [id number] at [redacted]w[redacted]d by  07/10/2018, by Last Menstrual Period presenting for routine prenatal visit  Plan   Problem List Items Addressed This Visit      Endocrine   Gestational diabetes mellitus (GDM) affecting third pregnancy     Other   History of macrosomia in infant in prior pregnancy, currently pregnant   Supervision of high risk pregnancy, antepartum   Abnormal glucose tolerance test (GTT) during pregnancy, antepartum    Other Visit Diagnoses    [redacted] weeks gestation of pregnancy    -  Primary   Relevant Orders   POC Urinalysis Dipstick OB (Completed)    CS BTL planned next week A NST procedure was performed with FHR monitoring and a normal baseline established, appropriate time of 20-40 minutes of evaluation, and accels >2 seen w 15x15 characteristics.  Results show a REACTIVE NST.   Review of ULTRASOUND.    I have personally reviewed images and report of recent ultrasound done at [facility].    Plan of management to be discussed with patient.  NST again Monday Cont BS monitoring and diet/Insulin management of GDMA2  Barnett Applebaum, MD, [facility], [facility] 06/17/2018  10:59 AM

## 2018-06-18 NOTE — Telephone Encounter (Signed)
This encounter was created in error - please disregard.

## 2018-06-20 LAB — CULTURE, BETA STREP (GROUP B ONLY): Strep Gp B Culture: POSITIVE — AB

## 2018-06-21 ENCOUNTER — Ambulatory Visit (INDEPENDENT_AMBULATORY_CARE_PROVIDER_SITE_OTHER): Payer: Commercial Managed Care - PPO | Admitting: Obstetrics and Gynecology

## 2018-06-21 ENCOUNTER — Encounter: Payer: Self-pay | Admitting: Obstetrics and Gynecology

## 2018-06-21 VITALS — BP 118/70 | Wt 208.0 lb

## 2018-06-21 DIAGNOSIS — F418 Other specified anxiety disorders: Secondary | ICD-10-CM

## 2018-06-21 DIAGNOSIS — O4440 Low lying placenta NOS or without hemorrhage, unspecified trimester: Secondary | ICD-10-CM

## 2018-06-21 DIAGNOSIS — F329 Major depressive disorder, single episode, unspecified: Secondary | ICD-10-CM

## 2018-06-21 DIAGNOSIS — Z98891 History of uterine scar from previous surgery: Secondary | ICD-10-CM

## 2018-06-21 DIAGNOSIS — O99343 Other mental disorders complicating pregnancy, third trimester: Secondary | ICD-10-CM | POA: Diagnosis not present

## 2018-06-21 DIAGNOSIS — O24410 Gestational diabetes mellitus in pregnancy, diet controlled: Secondary | ICD-10-CM

## 2018-06-21 DIAGNOSIS — O24419 Gestational diabetes mellitus in pregnancy, unspecified control: Secondary | ICD-10-CM

## 2018-06-21 DIAGNOSIS — O09299 Supervision of pregnancy with other poor reproductive or obstetric history, unspecified trimester: Secondary | ICD-10-CM

## 2018-06-21 DIAGNOSIS — O09293 Supervision of pregnancy with other poor reproductive or obstetric history, third trimester: Secondary | ICD-10-CM

## 2018-06-21 DIAGNOSIS — O4443 Low lying placenta NOS or without hemorrhage, third trimester: Secondary | ICD-10-CM

## 2018-06-21 DIAGNOSIS — O34219 Maternal care for unspecified type scar from previous cesarean delivery: Secondary | ICD-10-CM

## 2018-06-21 DIAGNOSIS — O0990 Supervision of high risk pregnancy, unspecified, unspecified trimester: Secondary | ICD-10-CM

## 2018-06-21 DIAGNOSIS — Z3A37 37 weeks gestation of pregnancy: Secondary | ICD-10-CM

## 2018-06-21 DIAGNOSIS — F32A Depression, unspecified: Secondary | ICD-10-CM

## 2018-06-21 DIAGNOSIS — F419 Anxiety disorder, unspecified: Secondary | ICD-10-CM

## 2018-06-21 NOTE — Progress Notes (Signed)
Routine Prenatal Care Visit  Subjective  Sonya Spencer is a 32 y.o. [phone] at [redacted]w[redacted]d being seen today for ongoing prenatal care.  She is currently monitored for the following issues for this high-risk pregnancy and has Anxiety and depression; Frequent headaches; Lipoma of axilla; History of macrosomia in infant in prior pregnancy, currently pregnant; Nausea and vomiting during pregnancy; History of cesarean delivery; Supervision of high risk pregnancy, antepartum; Abnormal glucose tolerance test (GTT) during pregnancy, antepartum; Gestational diabetes mellitus (GDM) affecting third pregnancy; Fall (on) (from) other stairs and steps, initial encounter; and Low-lying placenta on their problem list.  ----------------------------------------------------------------------------------- Patient reports no complaints.   Contractions: Not present. Vag. Bleeding: None.  Movement: Present. Denies leaking of fluid.  BG log with patient today. Much better control of BG values. See below.   ----------------------------------------------------------------------------------- The following portions of the patient's history were reviewed and updated as appropriate: allergies, current medications, past family history, past medical history, past social history, past surgical history and problem list. Problem list updated.   Objective  Blood pressure 118/70, weight 208 lb (94.3 kg), last menstrual period 10/03/2017, unknown if currently breastfeeding. Pregravid weight 199 lb (90.3 kg) Total Weight Gain 9 lb (4.082 kg) Urinalysis: Urine Protein    Urine Glucose    Fetal Status: Fetal Heart Rate (bpm): 145   Movement: Present     General:  Alert, oriented and cooperative. Patient is in no acute distress.  Skin: Skin is warm and dry. No rash noted.   Cardiovascular: Normal heart rate noted  Respiratory: Normal respiratory effort, no problems with respiration noted  Abdomen: Soft, gravid, appropriate for  gestational age. Pain/Pressure: Present     Pelvic:  Cervical exam deferred        Extremities: Normal range of motion.  Edema: None  Mental Status: Normal mood and affect. Normal behavior. Normal judgment and thought content.   NST: Baseline FHR: 145 beats/min Variability: moderate Accelerations: present Decelerations: absent Tocometry: not done  Interpretation:  INDICATIONS: gestational diabetes mellitus RESULTS:  A NST procedure was performed with FHR monitoring and a normal baseline established, appropriate time of 20-40 minutes of evaluation, and accels >2 seen w 15x15 characteristics.  Results show a REACTIVE NST.    Assessment   32 y.o. G4P3003 at [redacted]w[redacted]d by  07/10/2018, by Last Menstrual Period presenting for routine prenatal visit  Plan   FOURTH Problems (from 11/09/17 to present)    Problem Noted Resolved   Low-lying placenta 05/12/2018 by Will Bonnet, MD No   Overview Addendum 06/09/2018 10:56 AM by Will Bonnet, MD    Posterior-Resolved [redacted] weeks      Gestational diabetes mellitus (GDM) affecting third pregnancy 02/05/2018 by Homero Fellers, MD No   Abnormal glucose tolerance test (GTT) during pregnancy, antepartum 12/23/2017 by Will Bonnet, MD No   Overview Signed 12/23/2017  9:13 AM by Will Bonnet, MD    - pt check BG log.   - Lifestyles referral for presumptive GDM (also due to inordinate weight loss early in pregnancy) - will treat as GDM.  Patient has refused 3 hour gtt.        Supervision of high risk pregnancy, antepartum 11/16/2017 by Rod Can, CNM No   Overview Addendum 12/16/2017 12:06 PM by Homero Fellers, MD    [facility]inic [facility] Prenatal Labs  Dating L=7 Blood type: A/Positive/-- (03/04 1111)   Genetic Screen Declines Antibody:Negative (03/04 1111)  Anatomic [place]  Rubella: 2.29 (03/04 1111) Varicella: Immune  GTT  Early:149, 3 hr: DECLINED   Third trimester:  RPR: Non Reactive (03/04 1111)   Rhogam  not applicable  HBsAg: Negative (03/04 1111)   TDaP vaccine                        Flu Shot: 05/19/2017 HIV: Non Reactive (03/04 1111)   Baby Food                                GBS:   Contraception  Pap: Needs   CBB     CS/VBAC  Desires VBAC   Support Person              Decreased fetal movement 05/08/2018 by Will Bonnet, MD 06/21/2018 by Will Bonnet, MD   Supervision of normal pregnancy 11/09/2017 by Rexene Agent, CNM 11/16/2017 by Rod Can, CNM   Overview Signed 11/09/2017 10:00 AM by Rexene Agent, [place] Prenatal Labs  Dating  Blood type:     Genetic Screen 1 Screen:    AFP:     Quad:     NIPS: Antibody:   Anatomic [place]  Rubella:   Varicella:    GTT Early:               Third trimester:  RPR:     Rhogam  HBsAg:     TDaP vaccine                       Flu Shot: HIV:     Baby Food                                GBS:   Contraception  Pap:  CBB     CS/VBAC    Support Person                 Term labor symptoms and general obstetric precautions including but not limited to vaginal bleeding, contractions, leaking of fluid and fetal movement were reviewed in detail with the patient. Please refer to After Visit Summary for other counseling recommendations.   Return in about 2 days (around 06/23/2018) for keep previously scheduled H&P.  Prentice Docker, MD, [facility], [facility] 06/21/2018 10:10 AM

## 2018-06-23 ENCOUNTER — Encounter: Payer: Self-pay | Admitting: Obstetrics and Gynecology

## 2018-06-23 ENCOUNTER — Telehealth: Payer: Self-pay

## 2018-06-23 ENCOUNTER — Ambulatory Visit (INDEPENDENT_AMBULATORY_CARE_PROVIDER_SITE_OTHER): Payer: Commercial Managed Care - PPO | Admitting: Obstetrics and Gynecology

## 2018-06-23 ENCOUNTER — Other Ambulatory Visit: Payer: Self-pay

## 2018-06-23 ENCOUNTER — Encounter
Admission: RE | Admit: 2018-06-23 | Discharge: 2018-06-23 | Disposition: A | Discharge status: Home / Self Care | Payer: Commercial Managed Care - PPO | Source: Ambulatory Visit | Attending: Obstetrics and Gynecology | Admitting: Obstetrics and Gynecology

## 2018-06-23 VITALS — BP 118/74 | Ht 63.0 in | Wt 208.0 lb

## 2018-06-23 DIAGNOSIS — O09299 Supervision of pregnancy with other poor reproductive or obstetric history, unspecified trimester: Secondary | ICD-10-CM

## 2018-06-23 DIAGNOSIS — Z3A37 37 weeks gestation of pregnancy: Secondary | ICD-10-CM

## 2018-06-23 DIAGNOSIS — O24419 Gestational diabetes mellitus in pregnancy, unspecified control: Secondary | ICD-10-CM

## 2018-06-23 DIAGNOSIS — Z01812 Encounter for preprocedural laboratory examination: Secondary | ICD-10-CM

## 2018-06-23 DIAGNOSIS — O0990 Supervision of high risk pregnancy, unspecified, unspecified trimester: Secondary | ICD-10-CM

## 2018-06-23 DIAGNOSIS — Z98891 History of uterine scar from previous surgery: Secondary | ICD-10-CM

## 2018-06-23 HISTORY — DX: Gastro-esophageal reflux disease without esophagitis: K21.9

## 2018-06-23 LAB — CBC
HCT: 32.4 % — ABNORMAL LOW (ref 35.0–47.0)
Hemoglobin: 11.2 g/dL — ABNORMAL LOW (ref 12.0–16.0)
MCH: 28.3 pg (ref 26.0–34.0)
MCHC: 34.6 g/dL (ref 32.0–36.0)
MCV: 82 fL (ref 80.0–100.0)
PLATELETS: 249 10*3/uL (ref 150–440)
RBC: 3.95 MIL/uL (ref 3.80–5.20)
RDW: 15.2 % — AB (ref 11.5–14.5)
WBC: 11.1 10*3/uL — AB (ref 3.6–11.0)

## 2018-06-23 LAB — TYPE AND SCREEN
ABO/RH(D): A POS
Antibody Screen: NEGATIVE
Extend sample reason: UNDETERMINED

## 2018-06-23 MED ORDER — CEFAZOLIN SODIUM-DEXTROSE 2-4 GM/100ML-% IV SOLN
2.0000 g | INTRAVENOUS | Status: AC
  Administered 2018-06-24: 2 g via INTRAVENOUS
  Filled 2018-06-23: qty 100

## 2018-06-23 NOTE — H&P (View-Only) (Signed)
OB History & Physical   History of Present Illness:  Chief Complaint: pre-operative visit for cesarean section  HPI:  Sonya Spencer is a 32 y.o. [phone] female at [redacted]w[redacted]d dated by LMP consistent with 7 week ultrasound.  Her pregnancy has been complicated by history of macoromic infant (11 lb 8 oz) with G3, history of cesarean delivery, history of gestational diabetes in prior pregnancy and gestational diabetes requiring insulin in this pregnancy.    She denies contractions.   She denies leakage of fluid.   She denies vaginal bleeding.   She reports fetal movement.    Maternal Medical History:   Past Medical History:  Diagnosis Date  . Allergy   . Anemia   . Anxiety   . Cholelithiasis   . Depression   . Fetal macrosomia during pregnancy in third trimester 03/25/2015   Resolved with delivery   . Frequent headaches   . Gestational diabetes   . H/O maternal third degree perineal laceration, currently pregnant   . History of Papanicolaou smear of cervix 10/10/11; 08/02/14   neg, ct neg; neg ct//gc/tr neg  . Supervision of normal pregnancy 11/09/2017   [facility] Prenatal Labs Dating  Blood type:    Genetic Screen 1 Screen:    AFP:     Quad:     NIPS: Antibody:  Anatomic [place]  Rubella:   Varicella:   GTT Early:               Third trimester:  RPR:    Rhogam  HBsAg:    TDaP vaccine                       Flu Shot: HIV:    Baby Food                                GBS:  Contraception  Pap: CBB    CS/VBAC   Support Person        . Urinary incontinence     Past Surgical History:  Procedure Laterality Date  . CESAREAN SECTION N/A 03/22/2015   Procedure: CESAREAN SECTION;  Surgeon: Will Bonnet, MD;  Location: [facility] ORS;  Service: Obstetrics;  Laterality: N/A;    Allergies  Allergen Reactions  . Nickel Rash  . Red Dye Rash    Includes Benadryl as it has red dye in it.    Prior to Admission medications   Medication Sig Start Date End Date Taking? Authorizing Provider  ACCU-CHEK  AVIVA PLUS test strip  01/26/18   [provider]  acetaminophen (TYLENOL) 500 MG tablet Take 500 mg by mouth daily as needed for moderate pain or headache.    [provider]  cetirizine (ZYRTEC) 10 MG tablet Take 10 mg by mouth every evening.     [provider]  escitalopram (LEXAPRO) 10 MG tablet Take 1 tablet (10 mg total) by mouth daily. Patient taking differently: Take 10 mg by mouth every evening.  05/06/18   Rexene Agent, CNM  insulin aspart (NOVOLOG) 100 UNIT/ML injection Inject 6 Units into the skin 3 (three) times daily before meals. 06/09/18   Will Bonnet, MD  insulin detemir (LEVEMIR) 100 UNIT/ML injection Inject 0.1 mLs (10 Units total) into the skin at bedtime. Patient taking differently: Inject 20 Units into the skin at bedtime.  06/09/18   Will Bonnet, MD  Insulin Syringe-Needle U-100 (INSULIN SYRINGE  1CC/30GX1/2") 30G X 1/2" 1 ML MISC Use as directed for insulin administration 06/09/18   Will Bonnet, MD  Melatonin 5 MG CAPS Take 5 mg by mouth at bedtime as needed (sleep).    [provider]  Prenatal Vit-Fe Fumarate-FA (PRENATAL PO) Take 3 tablets by mouth every evening.    [provider]  ranitidine (ZANTAC) 75 MG tablet Take 75 mg by mouth daily as needed for heartburn.    [provider]    OB History  Gravida Para Term Preterm AB Living  4 3 3  0 0 3  SAB TAB Ectopic Multiple Live Births  0 0 0 0 3    # Outcome Date GA Lbr Len/2nd Weight Sex Delivery Anes PTL Lv  4 Current           3 Term 03/22/15 [redacted]w[redacted]d  11 lb 7.8 oz (5.211 kg) M CS-LTranv Spinal  LIV     Complications: Fetal macrosomia  2 Term 06/02/12 [redacted]w[redacted]d  8 lb 14 oz (4.026 kg) F Vag-Spont EPI  LIV     Birth Comments: TEARING SECOND DEGREE  1 Term 01/02/10 [redacted]w[redacted]d  9 lb 11 oz (4.394 kg) F Vag-Vacuum EPI Y LIV     Birth Comments: IOL AT [redacted]W[redacted]D; LARGE BABY, MAVVUM FOR MATERNAL EXHAUSTION AND VARIABLE DECELS WITH FETAL TACHYDARDIA, PARTIAL 3RD  DEGREE LACERATION    Prenatal care site: [facility]  Social History: She  reports that she quit smoking about 9 years ago. Her smoking use included cigarettes. She has a 0.50 pack-year smoking history. She has never used smokeless tobacco. She reports that she drank alcohol. She reports that she does not use drugs.  Family History: family history includes Depression in her mother; Diabetes in her daughter, father, maternal grandmother, paternal aunt, and paternal grandfather; Diabetes type I in her daughter and father; Hepatitis C in her mother; Lupus in her maternal aunt, maternal grandmother, mother, and sister; Thyroid disease in her father.   Review of Systems: Negative x 10 systems reviewed except as noted in the HPI.    Physical Exam:  Vital Signs: BP 118/74   Ht 5\' 3"  (1.6 m)   Wt 208 lb (94.3 kg)   LMP 10/03/2017   BMI 36.85 kg/m  Constitutional: Well nourished, well developed female in no acute distress.  HEENT: normal Skin: Warm and dry.  Cardiovascular: Regular rate and rhythm.   Extremity: no edema  Respiratory: Clear to auscultation bilateral. Normal respiratory effort Abdomen: FHT present and gravid/NT Back: no CVAT Neuro: DTRs 2+, Cranial nerves grossly intact Psych: Alert and Oriented x3. No memory deficits. Normal mood and affect.  MS: normal gait, normal bilateral lower extremity ROM/strength/stability. FHT: 145 bpm  Pertinent Results:  Prenatal Labs: Blood type/Rh A positive  Antibody screen negative  Rubella Immune  Varicella Immune    RPR NR  HBsAg negative  HIV negative  GC negative  Chlamydia negative  Genetic screening NIPT diploid XY  1 hour GTT GDM  3 hour GTT   GBS positive on 06/17/2018   Assessment:  Sonya Spencer is a 33 y.o. [phone] female at [redacted]w[redacted]d with history of cesarean delivery, desires repeat with tubal ligation.   Plan:  1. Admit to Labor & Delivery  2. CBC, T&S, Clrs, IVF 3. GBS positive.   4. GDMA2: gestational  diabetes, requiring insulin.  Uncontrolled at this point.  Earlier delivery indicated for this reason.   70. 32 y.o. [id number]  with undesired fertility, desires permanent sterilization.  Other reversible forms of contraception were discussed with patient; she declines all other modalities. Permanent nature of as well as associated risks of the procedure discussed with patient including but not limited to: risk of regret, permanence of method, bleeding, infection, injury to surrounding organs and need for additional procedures.  Failure risk of 0.5-1% with increased risk of ectopic gestation if pregnancy occurs was also discussed with patient.    Prentice Docker, MD 06/23/2018 10:14 AM

## 2018-06-23 NOTE — Progress Notes (Signed)
OB History & Physical   History of Present Illness:  Chief Complaint: pre-operative visit for cesarean section  HPI:  Sonya Spencer is a 32 y.o. [phone] female at [redacted]w[redacted]d dated by LMP consistent with 7 week ultrasound.  Her pregnancy has been complicated by history of macoromic infant (11 lb 8 oz) with G3, history of cesarean delivery, history of gestational diabetes in prior pregnancy and gestational diabetes requiring insulin in this pregnancy.    She denies contractions.   She denies leakage of fluid.   She denies vaginal bleeding.   She reports fetal movement.    Maternal Medical History:   Past Medical History:  Diagnosis Date  . Allergy   . Anemia   . Anxiety   . Cholelithiasis   . Depression   . Fetal macrosomia during pregnancy in third trimester 03/25/2015   Resolved with delivery   . Frequent headaches   . Gestational diabetes   . H/O maternal third degree perineal laceration, currently pregnant   . History of Papanicolaou smear of cervix 10/10/11; 08/02/14   neg, ct neg; neg ct//gc/tr neg  . Supervision of normal pregnancy 11/09/2017   [facility] Prenatal Labs Dating  Blood type:    Genetic Screen 1 Screen:    AFP:     Quad:     NIPS: Antibody:  Anatomic [place]  Rubella:   Varicella:   GTT Early:               Third trimester:  RPR:    Rhogam  HBsAg:    TDaP vaccine                       Flu Shot: HIV:    Baby Food                                GBS:  Contraception  Pap: CBB    CS/VBAC   Support Person        . Urinary incontinence     Past Surgical History:  Procedure Laterality Date  . CESAREAN SECTION N/A 03/22/2015   Procedure: CESAREAN SECTION;  Surgeon: Will Bonnet, MD;  Location: [facility] ORS;  Service: Obstetrics;  Laterality: N/A;    Allergies  Allergen Reactions  . Nickel Rash  . Red Dye Rash    Includes Benadryl as it has red dye in it.    Prior to Admission medications   Medication Sig Start Date End Date Taking? Authorizing Provider  ACCU-CHEK  AVIVA PLUS test strip  01/26/18   [provider]  acetaminophen (TYLENOL) 500 MG tablet Take 500 mg by mouth daily as needed for moderate pain or headache.    [provider]  cetirizine (ZYRTEC) 10 MG tablet Take 10 mg by mouth every evening.     [provider]  escitalopram (LEXAPRO) 10 MG tablet Take 1 tablet (10 mg total) by mouth daily. Patient taking differently: Take 10 mg by mouth every evening.  05/06/18   Rexene Agent, CNM  insulin aspart (NOVOLOG) 100 UNIT/ML injection Inject 6 Units into the skin 3 (three) times daily before meals. 06/09/18   Will Bonnet, MD  insulin detemir (LEVEMIR) 100 UNIT/ML injection Inject 0.1 mLs (10 Units total) into the skin at bedtime. Patient taking differently: Inject 20 Units into the skin at bedtime.  06/09/18   Will Bonnet, MD  Insulin Syringe-Needle U-100 (INSULIN SYRINGE  1CC/30GX1/2") 30G X 1/2" 1 ML MISC Use as directed for insulin administration 06/09/18   Will Bonnet, MD  Melatonin 5 MG CAPS Take 5 mg by mouth at bedtime as needed (sleep).    [provider]  Prenatal Vit-Fe Fumarate-FA (PRENATAL PO) Take 3 tablets by mouth every evening.    [provider]  ranitidine (ZANTAC) 75 MG tablet Take 75 mg by mouth daily as needed for heartburn.    [provider]    OB History  Gravida Para Term Preterm AB Living  4 3 3  0 0 3  SAB TAB Ectopic Multiple Live Births  0 0 0 0 3    # Outcome Date GA Lbr Len/2nd Weight Sex Delivery Anes PTL Lv  4 Current           3 Term 03/22/15 [redacted]w[redacted]d  11 lb 7.8 oz (5.211 kg) M CS-LTranv Spinal  LIV     Complications: Fetal macrosomia  2 Term 06/02/12 [redacted]w[redacted]d  8 lb 14 oz (4.026 kg) F Vag-Spont EPI  LIV     Birth Comments: TEARING SECOND DEGREE  1 Term 01/02/10 [redacted]w[redacted]d  9 lb 11 oz (4.394 kg) F Vag-Vacuum EPI Y LIV     Birth Comments: IOL AT [redacted]W[redacted]D; LARGE BABY, MAVVUM FOR MATERNAL EXHAUSTION AND VARIABLE DECELS WITH FETAL TACHYDARDIA, PARTIAL 3RD  DEGREE LACERATION    Prenatal care site: [facility]  Social History: She  reports that she quit smoking about 9 years ago. Her smoking use included cigarettes. She has a 0.50 pack-year smoking history. She has never used smokeless tobacco. She reports that she drank alcohol. She reports that she does not use drugs.  Family History: family history includes Depression in her mother; Diabetes in her daughter, father, maternal grandmother, paternal aunt, and paternal grandfather; Diabetes type I in her daughter and father; Hepatitis C in her mother; Lupus in her maternal aunt, maternal grandmother, mother, and sister; Thyroid disease in her father.   Review of Systems: Negative x 10 systems reviewed except as noted in the HPI.    Physical Exam:  Vital Signs: BP 118/74   Ht 5\' 3"  (1.6 m)   Wt 208 lb (94.3 kg)   LMP 10/03/2017   BMI 36.85 kg/m  Constitutional: Well nourished, well developed female in no acute distress.  HEENT: normal Skin: Warm and dry.  Cardiovascular: Regular rate and rhythm.   Extremity: no edema  Respiratory: Clear to auscultation bilateral. Normal respiratory effort Abdomen: FHT present and gravid/NT Back: no CVAT Neuro: DTRs 2+, Cranial nerves grossly intact Psych: Alert and Oriented x3. No memory deficits. Normal mood and affect.  MS: normal gait, normal bilateral lower extremity ROM/strength/stability. FHT: 145 bpm  Pertinent Results:  Prenatal Labs: Blood type/Rh A positive  Antibody screen negative  Rubella Immune  Varicella Immune    RPR NR  HBsAg negative  HIV negative  GC negative  Chlamydia negative  Genetic screening NIPT diploid XY  1 hour GTT GDM  3 hour GTT   GBS positive on 06/17/2018   Assessment:  Sonya Spencer is a 32 y.o. [phone] female at [redacted]w[redacted]d with history of cesarean delivery, desires repeat with tubal ligation.   Plan:  1. Admit to Labor & Delivery  2. CBC, T&S, Clrs, IVF 3. GBS positive.   4. GDMA2: gestational  diabetes, requiring insulin.  Uncontrolled at this point.  Earlier delivery indicated for this reason.   54. 32 y.o. [id number]  with undesired fertility, desires permanent sterilization.  Other reversible forms of contraception were discussed with patient; she declines all other modalities. Permanent nature of as well as associated risks of the procedure discussed with patient including but not limited to: risk of regret, permanence of method, bleeding, infection, injury to surrounding organs and need for additional procedures.  Failure risk of 0.5-1% with increased risk of ectopic gestation if pregnancy occurs was also discussed with patient.    Prentice Docker, MD 06/23/2018 10:14 AM

## 2018-06-23 NOTE — Telephone Encounter (Signed)
FMLA/DISABILITY form for [facility] for pt's spouse, Sonya Spencer, filled out, signature obtained and given to [place] for processing.

## 2018-06-23 NOTE — Patient Instructions (Signed)
Your procedure is scheduled on: Thursday 06/24/18 Report to [facility] AT 9:45 AM .  Remember: Instructions that are not followed completely may result in serious medical risk, up to and including death, or upon the discretion of your surgeon and anesthesiologist your surgery may need to be rescheduled.     _X__ 1. Do not eat food after midnight the night before your procedure.                 No gum chewing or hard candies. You may drink clear liquids up to 2 hours                 before you are scheduled to arrive for your surgery- DO not drink clear                 liquids within 2 hours of the start of your surgery.                 Clear Liquids include:  water, apple juice without pulp, clear carbohydrate                 drink such as Clearfast or Gatorade, Black Coffee or Tea (Do not add                 anything to coffee or tea).  __X__2.  On the morning of surgery brush your teeth with toothpaste and water, you                 may rinse your mouth with mouthwash if you wish.  Do not swallow any              toothpaste of mouthwash.     _X__ 3.  No Alcohol for 24 hours before or after surgery.   _X__ 4.  Do Not Smoke or use e-cigarettes For 24 Hours Prior to Your Surgery.                 Do not use any chewable tobacco products for at least 6 hours prior to                 surgery.  ____  5.  Bring all medications with you on the day of surgery if instructed.   __X__  6.  Notify your doctor if there is any change in your medical condition      (cold, fever, infections).     Do not wear jewelry, make-up, hairpins, clips or nail polish. Do not wear lotions, powders, or perfumes.  Do not shave 48 hours prior to surgery. Men may shave face and neck. Do not bring valuables to the hospital.    [facility] is not responsible for any belongings or valuables.  Contacts, dentures/partials or body piercings may not be worn into surgery.  Bring a case for your contacts, glasses or hearing aids, a denture cup will be supplied. Leave your suitcase in the car. After surgery it may be brought to your room. For patients admitted to the hospital, discharge time is determined by your treatment team.   Patients discharged the day of surgery will not be allowed to drive home.   Please read over the following fact sheets that you were given:   MRSA Information  __X__ Take these medicines the morning of surgery with A SIP OF WATER:    1. rANITIDINE  2.   3.   4.  5.  6.  ____ Fleet Enema (as directed)   __X__ Use CHG Soap/SAGE wipes as directed  ____ Use inhalers on the day of surgery  ____ Stop metformin/Janumet/Farxiga 2 days prior to surgery    __X__ Take 1/2 of usual insulin dose the night before surgery. No insulin the morning          of surgery.   ____ Stop Blood Thinners Coumadin/Plavix/Xarelto/Pleta/Pradaxa/Eliquis/Effient/Aspirin  on   Or contact your Surgeon, Cardiologist or Medical Doctor regarding  ability to stop your blood thinners  __ __ Stop Anti-inflammatories 7 days before surgery such as Advil, Ibuprofen, Motrin,  BC or Goodies Powder, Naprosyn, Naproxen, Aleve, Aspirin    __ __ Stop all herbal supplements, fish oil or vitamin E until after surgery.    ____ Bring C-Pap to the hospital.

## 2018-06-24 ENCOUNTER — Other Ambulatory Visit: Payer: Self-pay

## 2018-06-24 ENCOUNTER — Encounter
Admission: RE | Disposition: A | Discharge status: Home / Self Care | Payer: Self-pay | Source: Home / Self Care | Attending: Obstetrics and Gynecology

## 2018-06-24 ENCOUNTER — Inpatient Hospital Stay: Payer: Commercial Managed Care - PPO | Admitting: Certified Registered Nurse Anesthetist

## 2018-06-24 ENCOUNTER — Inpatient Hospital Stay
Admission: RE | Admit: 2018-06-24 | Discharge: 2018-06-27 | DRG: 785 | Disposition: A | Discharge status: Home / Self Care | Payer: Commercial Managed Care - PPO | Attending: Obstetrics and Gynecology | Admitting: Obstetrics and Gynecology

## 2018-06-24 DIAGNOSIS — O34219 Maternal care for unspecified type scar from previous cesarean delivery: Secondary | ICD-10-CM | POA: Diagnosis not present

## 2018-06-24 DIAGNOSIS — O0990 Supervision of high risk pregnancy, unspecified, unspecified trimester: Secondary | ICD-10-CM

## 2018-06-24 DIAGNOSIS — O24424 Gestational diabetes mellitus in childbirth, insulin controlled: Secondary | ICD-10-CM | POA: Diagnosis not present

## 2018-06-24 DIAGNOSIS — K219 Gastro-esophageal reflux disease without esophagitis: Secondary | ICD-10-CM | POA: Diagnosis present

## 2018-06-24 DIAGNOSIS — Z302 Encounter for sterilization: Secondary | ICD-10-CM | POA: Diagnosis not present

## 2018-06-24 DIAGNOSIS — O99810 Abnormal glucose complicating pregnancy: Secondary | ICD-10-CM

## 2018-06-24 DIAGNOSIS — Z3A37 37 weeks gestation of pregnancy: Secondary | ICD-10-CM | POA: Diagnosis not present

## 2018-06-24 DIAGNOSIS — Z87891 Personal history of nicotine dependence: Secondary | ICD-10-CM | POA: Diagnosis not present

## 2018-06-24 DIAGNOSIS — O34211 Maternal care for low transverse scar from previous cesarean delivery: Secondary | ICD-10-CM | POA: Diagnosis present

## 2018-06-24 DIAGNOSIS — O99824 Streptococcus B carrier state complicating childbirth: Secondary | ICD-10-CM | POA: Diagnosis present

## 2018-06-24 DIAGNOSIS — O24419 Gestational diabetes mellitus in pregnancy, unspecified control: Secondary | ICD-10-CM

## 2018-06-24 DIAGNOSIS — O09299 Supervision of pregnancy with other poor reproductive or obstetric history, unspecified trimester: Secondary | ICD-10-CM

## 2018-06-24 DIAGNOSIS — F329 Major depressive disorder, single episode, unspecified: Secondary | ICD-10-CM | POA: Diagnosis present

## 2018-06-24 DIAGNOSIS — F32A Depression, unspecified: Secondary | ICD-10-CM | POA: Diagnosis present

## 2018-06-24 DIAGNOSIS — F411 Generalized anxiety disorder: Secondary | ICD-10-CM | POA: Diagnosis present

## 2018-06-24 DIAGNOSIS — Z98891 History of uterine scar from previous surgery: Secondary | ICD-10-CM

## 2018-06-24 DIAGNOSIS — O4440 Low lying placenta NOS or without hemorrhage, unspecified trimester: Secondary | ICD-10-CM

## 2018-06-24 DIAGNOSIS — F419 Anxiety disorder, unspecified: Secondary | ICD-10-CM | POA: Diagnosis present

## 2018-06-24 DIAGNOSIS — O9962 Diseases of the digestive system complicating childbirth: Secondary | ICD-10-CM | POA: Diagnosis present

## 2018-06-24 LAB — GLUCOSE, CAPILLARY
Glucose-Capillary: 81 mg/dL (ref 70–99)
Glucose-Capillary: 78 mg/dL (ref 70–99)
Glucose-Capillary: 137 mg/dL — ABNORMAL HIGH (ref 70–99)

## 2018-06-24 LAB — RPR: RPR: NONREACTIVE

## 2018-06-24 SURGERY — Surgical Case
Anesthesia: Spinal

## 2018-06-24 MED ORDER — DIPHENHYDRAMINE HCL 25 MG PO CAPS: 25.0000 mg | ORAL_CAPSULE | ORAL | Status: DC | PRN

## 2018-06-24 MED ORDER — DIPHENHYDRAMINE HCL 25 MG PO CAPS: 25.0000 mg | ORAL_CAPSULE | Freq: Four times a day (QID) | ORAL | Status: DC | PRN

## 2018-06-24 MED ORDER — OXYTOCIN 40 UNITS IN LACTATED RINGERS INFUSION - SIMPLE MED
INTRAVENOUS | Status: AC
  Filled 2018-06-24: qty 1000

## 2018-06-24 MED ORDER — LACTATED RINGERS IV SOLN
INTRAVENOUS | Status: DC
  Administered 2018-06-24: 12:00:00 via INTRAVENOUS

## 2018-06-24 MED ORDER — LIDOCAINE HCL (PF) 1 % IJ SOLN
INTRAMUSCULAR | Status: DC | PRN
  Administered 2018-06-24: 3 mL via SUBCUTANEOUS

## 2018-06-24 MED ORDER — LACTATED RINGERS IV SOLN: INTRAVENOUS | Status: DC

## 2018-06-24 MED ORDER — NALOXONE HCL 0.4 MG/ML IJ SOLN: 0.4000 mg | INTRAMUSCULAR | Status: DC | PRN

## 2018-06-24 MED ORDER — MENTHOL 3 MG MT LOZG
1.0000 | LOZENGE | OROMUCOSAL | Status: DC | PRN
  Filled 2018-06-24: qty 9

## 2018-06-24 MED ORDER — ESCITALOPRAM OXALATE 10 MG PO TABS
10.0000 mg | ORAL_TABLET | Freq: Every evening | ORAL | Status: DC
  Administered 2018-06-24 – 2018-06-26 (×3): 10 mg via ORAL
  Filled 2018-06-24 (×4): qty 1

## 2018-06-24 MED ORDER — COCONUT OIL OIL
1.0000 "application " | TOPICAL_OIL | Status: DC | PRN
  Administered 2018-06-25: 1 via TOPICAL
  Filled 2018-06-24: qty 120

## 2018-06-24 MED ORDER — MORPHINE SULFATE (PF) 0.5 MG/ML IJ SOLN
INTRAMUSCULAR | Status: AC
  Filled 2018-06-24: qty 10

## 2018-06-24 MED ORDER — MEPERIDINE HCL 25 MG/ML IJ SOLN: 6.2500 mg | INTRAMUSCULAR | Status: DC | PRN

## 2018-06-24 MED ORDER — IBUPROFEN 600 MG PO TABS: 600.0000 mg | ORAL_TABLET | Freq: Four times a day (QID) | ORAL | Status: DC

## 2018-06-24 MED ORDER — MORPHINE SULFATE (PF) 0.5 MG/ML IJ SOLN
INTRAMUSCULAR | Status: DC | PRN
  Administered 2018-06-24: .1 mg via EPIDURAL

## 2018-06-24 MED ORDER — OXYTOCIN 40 UNITS IN LACTATED RINGERS INFUSION - SIMPLE MED
2.5000 [IU]/h | INTRAVENOUS | Status: AC
  Administered 2018-06-24: 2.5 [IU]/h via INTRAVENOUS

## 2018-06-24 MED ORDER — BUPIVACAINE HCL 0.5 % IJ SOLN
INTRAMUSCULAR | Status: DC | PRN
  Administered 2018-06-24: 10 mL

## 2018-06-24 MED ORDER — DIPHENHYDRAMINE HCL 50 MG/ML IJ SOLN: 12.5000 mg | INTRAMUSCULAR | Status: DC | PRN

## 2018-06-24 MED ORDER — PRENATAL MULTIVITAMIN CH
1.0000 | ORAL_TABLET | Freq: Every evening | ORAL | Status: DC
  Filled 2018-06-24 (×2): qty 1

## 2018-06-24 MED ORDER — WITCH HAZEL-GLYCERIN EX PADS: 1.0000 "application " | MEDICATED_PAD | CUTANEOUS | Status: DC | PRN

## 2018-06-24 MED ORDER — ONDANSETRON HCL 4 MG/2ML IJ SOLN: 4.0000 mg | Freq: Three times a day (TID) | INTRAMUSCULAR | Status: DC | PRN

## 2018-06-24 MED ORDER — LACTATED RINGERS IV BOLUS
1000.0000 mL | Freq: Once | INTRAVENOUS | Status: AC
  Administered 2018-06-24: 1000 mL via INTRAVENOUS

## 2018-06-24 MED ORDER — ONDANSETRON HCL 4 MG/2ML IJ SOLN: 4.0000 mg | Freq: Once | INTRAMUSCULAR | Status: DC | PRN

## 2018-06-24 MED ORDER — FERROUS SULFATE 325 (65 FE) MG PO TABS
325.0000 mg | ORAL_TABLET | Freq: Two times a day (BID) | ORAL | Status: DC
  Administered 2018-06-25: 325 mg via ORAL
  Filled 2018-06-24: qty 1

## 2018-06-24 MED ORDER — HYDROCODONE-ACETAMINOPHEN 5-325 MG PO TABS: 2.0000 | ORAL_TABLET | ORAL | Status: DC | PRN

## 2018-06-24 MED ORDER — SODIUM CHLORIDE 0.9 % IV SOLN
INTRAVENOUS | Status: DC | PRN
  Administered 2018-06-24: 25 ug/min via INTRAVENOUS

## 2018-06-24 MED ORDER — BUPIVACAINE HCL (PF) 0.5 % IJ SOLN
5.0000 mL | Freq: Once | INTRAMUSCULAR | Status: DC
  Filled 2018-06-24: qty 30

## 2018-06-24 MED ORDER — LORATADINE 10 MG PO TABS
10.0000 mg | ORAL_TABLET | Freq: Every day | ORAL | Status: DC
  Administered 2018-06-24 – 2018-06-26 (×3): 10 mg via ORAL
  Filled 2018-06-24 (×4): qty 1

## 2018-06-24 MED ORDER — PHENYLEPHRINE HCL 10 MG/ML IJ SOLN
INTRAMUSCULAR | Status: DC | PRN
  Administered 2018-06-24 (×3): 100 ug via INTRAVENOUS

## 2018-06-24 MED ORDER — FENTANYL CITRATE (PF) 100 MCG/2ML IJ SOLN
INTRAMUSCULAR | Status: DC | PRN
  Administered 2018-06-24: 15 ug via INTRAVENOUS

## 2018-06-24 MED ORDER — KETOROLAC TROMETHAMINE 30 MG/ML IJ SOLN: 30.0000 mg | Freq: Four times a day (QID) | INTRAMUSCULAR | Status: AC | PRN

## 2018-06-24 MED ORDER — KETOROLAC TROMETHAMINE 30 MG/ML IJ SOLN
30.0000 mg | Freq: Four times a day (QID) | INTRAMUSCULAR | Status: AC | PRN
  Administered 2018-06-24 – 2018-06-25 (×3): 30 mg via INTRAVENOUS
  Filled 2018-06-24 (×3): qty 1

## 2018-06-24 MED ORDER — FENTANYL CITRATE (PF) 100 MCG/2ML IJ SOLN: 25.0000 ug | INTRAMUSCULAR | Status: DC | PRN

## 2018-06-24 MED ORDER — BUPIVACAINE HCL (PF) 0.5 % IJ SOLN: 5.0000 mL | Freq: Once | INTRAMUSCULAR | Status: DC

## 2018-06-24 MED ORDER — NALBUPHINE HCL 10 MG/ML IJ SOLN: 5.0000 mg | INTRAMUSCULAR | Status: DC | PRN

## 2018-06-24 MED ORDER — BUPIVACAINE 0.25 % ON-Q PUMP DUAL CATH 400 ML
400.0000 mL | INJECTION | Status: DC
  Filled 2018-06-24: qty 400

## 2018-06-24 MED ORDER — FENTANYL CITRATE (PF) 100 MCG/2ML IJ SOLN
INTRAMUSCULAR | Status: AC
  Filled 2018-06-24: qty 2

## 2018-06-24 MED ORDER — OXYTOCIN 40 UNITS IN LACTATED RINGERS INFUSION - SIMPLE MED
INTRAVENOUS | Status: DC | PRN
  Administered 2018-06-24: 40 mL via INTRAVENOUS
  Administered 2018-06-24: 700 mL via INTRAVENOUS

## 2018-06-24 MED ORDER — DIBUCAINE 1 % RE OINT: 1.0000 "application " | TOPICAL_OINTMENT | RECTAL | Status: DC | PRN

## 2018-06-24 MED ORDER — SOD CITRATE-CITRIC ACID 500-334 MG/5ML PO SOLN
30.0000 mL | ORAL | Status: AC
  Administered 2018-06-24: 30 mL via ORAL
  Filled 2018-06-24: qty 15

## 2018-06-24 MED ORDER — SIMETHICONE 80 MG PO CHEW
80.0000 mg | CHEWABLE_TABLET | Freq: Three times a day (TID) | ORAL | Status: DC
  Administered 2018-06-26 (×2): 80 mg via ORAL
  Filled 2018-06-24 (×4): qty 1

## 2018-06-24 MED ORDER — BUPIVACAINE IN DEXTROSE 0.75-8.25 % IT SOLN
INTRATHECAL | Status: DC | PRN
  Administered 2018-06-24: 1.7 mL via INTRATHECAL

## 2018-06-24 MED ORDER — NALBUPHINE HCL 10 MG/ML IJ SOLN: 5.0000 mg | Freq: Once | INTRAMUSCULAR | Status: DC | PRN

## 2018-06-24 MED ORDER — SODIUM CHLORIDE 0.9% FLUSH: 3.0000 mL | INTRAVENOUS | Status: DC | PRN

## 2018-06-24 MED ORDER — HYDROCODONE-ACETAMINOPHEN 5-325 MG PO TABS: 1.0000 | ORAL_TABLET | ORAL | Status: DC | PRN

## 2018-06-24 MED ORDER — SENNOSIDES-DOCUSATE SODIUM 8.6-50 MG PO TABS
2.0000 | ORAL_TABLET | ORAL | Status: DC
  Administered 2018-06-24 – 2018-06-26 (×2): 2 via ORAL
  Filled 2018-06-24 (×2): qty 2

## 2018-06-24 MED ORDER — NALOXONE HCL 4 MG/10ML IJ SOLN
1.0000 ug/kg/h | INTRAVENOUS | Status: DC | PRN
  Filled 2018-06-24: qty 5

## 2018-06-24 MED ORDER — OXYCODONE HCL 5 MG PO TABS: 5.0000 mg | ORAL_TABLET | ORAL | Status: DC | PRN

## 2018-06-24 SURGICAL SUPPLY — 32 items
CANISTER SUCT 3000ML PPV (MISCELLANEOUS) ×3 IMPLANT
CATH KIT ON-Q SILVERSOAK 5IN (CATHETERS) ×6 IMPLANT
CHLORAPREP W/TINT 26ML (MISCELLANEOUS) ×6 IMPLANT
CLOSURE WOUND 1/2 X4 (GAUZE/BANDAGES/DRESSINGS) ×1
CUP MEDICINE 2OZ PLAST GRAD ST (MISCELLANEOUS) ×3 IMPLANT
DERMABOND ADVANCED (GAUZE/BANDAGES/DRESSINGS) ×2
DERMABOND ADVANCED .7 DNX12 (GAUZE/BANDAGES/DRESSINGS) ×1 IMPLANT
DRSG OPSITE POSTOP 4X10 (GAUZE/BANDAGES/DRESSINGS) ×3 IMPLANT
DRSG TELFA 3X8 NADH (GAUZE/BANDAGES/DRESSINGS) ×3 IMPLANT
ELECT REM PT RETURN 9FT ADLT (ELECTROSURGICAL) ×3
ELECTRODE REM PT RTRN 9FT ADLT (ELECTROSURGICAL) ×1 IMPLANT
GAUZE SPONGE 4X4 12PLY STRL (GAUZE/BANDAGES/DRESSINGS) ×3 IMPLANT
GLOVE BIO SURGEON STRL SZ8 (GLOVE) ×3 IMPLANT
GLOVE BIOGEL PI IND STRL 7.5 (GLOVE) ×1 IMPLANT
GLOVE BIOGEL PI INDICATOR 7.5 (GLOVE) ×2
GLOVE SKINSENSE NS SZ7.0 (GLOVE) ×2
GLOVE SKINSENSE STRL SZ7.0 (GLOVE) ×1 IMPLANT
GOWN STRL REUS W/ TWL LRG LVL3 (GOWN DISPOSABLE) ×2 IMPLANT
GOWN STRL REUS W/TWL LRG LVL3 (GOWN DISPOSABLE) ×4
NEEDLE HYPO 22GX1.5 SAFETY (NEEDLE) ×3 IMPLANT
NS IRRIG 1000ML POUR BTL (IV SOLUTION) ×3 IMPLANT
PACK C SECTION AR (MISCELLANEOUS) ×3 IMPLANT
PAD OB MATERNITY 4.3X12.25 (PERSONAL CARE ITEMS) ×6 IMPLANT
PAD PREP 24X41 OB/GYN DISP (PERSONAL CARE ITEMS) ×3 IMPLANT
STRIP CLOSURE SKIN 1/2X4 (GAUZE/BANDAGES/DRESSINGS) ×2 IMPLANT
SUT 2-0 PL GUT LIGAPAK (SUTURE) ×3 IMPLANT
SUT MNCRL AB 4-0 PS2 18 (SUTURE) ×3 IMPLANT
SUT PDS AB 1 TP1 96 (SUTURE) ×6 IMPLANT
SUT VIC AB 0 CT1 36 (SUTURE) ×12 IMPLANT
SUT VIC AB 1 CT1 36 (SUTURE) ×3 IMPLANT
SWABSTK COMLB BENZOIN TINCTURE (MISCELLANEOUS) ×3 IMPLANT
SYR 10ML LL (SYRINGE) ×3 IMPLANT

## 2018-06-24 NOTE — Discharge Summary (Addendum)
OB Discharge Summary     Patient Name: Sonya Spencer DOB: 1986/08/22 MRN: [id number]  Date of admission: 06/24/2018 Delivering MD: Prentice Docker, MD  Date of Delivery: 06/24/2018  Date of discharge: 06/27/2018  Admitting diagnosis:  1) uncontrolled gestational diabetes, requiring insulin treatment 2) history of previous cesarean delivery, desires repeat 3) desires permanent sterilization  Intrauterine pregnancy: [redacted]w[redacted]d     Secondary diagnosis: Gestational Diabetes medication controlled (A2)     Discharge diagnosis: Term Pregnancy Delivered and GDM A2                                                                                                Post partum procedures:None  Augmentation: n/a  Complications: None  Hospital course:  Sceduled C/S   32 y.o. yo [id number] at [redacted]w[redacted]d was admitted to the hospital 06/24/2018 for scheduled cesarean section with the following indication:Elective Repeat and desires permanent sterilization.  Membrane Rupture Time/Date: 12:50 PM ,06/24/2018   Patient delivered a Viable infant.06/24/2018  Details of operation can be found in separate operative note.  Pateint had an uncomplicated postpartum course.  She is ambulating, tolerating a regular diet, passing flatus, and urinating well. Patient is discharged home in stable condition on  06/27/18         Physical exam  Vitals:   06/26/18 1128 06/26/18 1723 06/27/18 0555 06/27/18 1200  BP: 133/70 116/82 125/75 (P) 120/74  Pulse: 73 68 74 (P) 68  Resp: 18 20  (P) 18  Temp: 98.2 F (36.8 C) 98 F (36.7 C) 97.6 F (36.4 C)   TempSrc: Oral Oral Oral   SpO2: 99% 97% 100%   Weight:      Height:       General: alert, cooperative and no distress Lochia: appropriate Uterine Fundus: firm Incision: Dressing is clean, dry, and intact, On-Q pump is leaking and will be removed DVT Evaluation: No evidence of DVT seen on physical exam.  Labs: Lab Results  Component Value Date   WBC 12.9 (H) 06/25/2018   HGB  10.2 (L) 06/25/2018   HCT 29.7 (L) 06/25/2018   MCV 83.0 06/25/2018   PLT 185 06/25/2018    Discharge instruction: per After Visit Summary.  Medications:  Allergies as of 06/27/2018      Reactions   Nickel Rash   Red Dye Rash   Includes Benadryl as it has red dye in it.      Medication List    STOP taking these medications   ACCU-CHEK AVIVA PLUS test strip Generic drug:  glucose blood   acetaminophen 500 MG tablet Commonly known as:  TYLENOL   insulin aspart 100 UNIT/ML injection Commonly known as:  novoLOG   insulin detemir 100 UNIT/ML injection Commonly known as:  LEVEMIR   INSULIN SYRINGE 1CC/30GX1/2" 30G X 1/2" 1 ML Misc   ranitidine 75 MG tablet Commonly known as:  ZANTAC     TAKE these medications   cetirizine 10 MG tablet Commonly known as:  ZYRTEC Take 10 mg by mouth every evening.   escitalopram 10 MG tablet Commonly known as:  LEXAPRO Take 1 tablet (10  mg total) by mouth daily. What changed:  when to take this   ibuprofen 600 MG tablet Commonly known as:  ADVIL,MOTRIN Take 1 tablet (600 mg total) by mouth every 6 (six) hours as needed.   Melatonin 5 MG Caps Take 5-10 mg by mouth at bedtime as needed (sleep).   PRENATAL PO Take 3 tablets by mouth every evening.            Discharge Care Instructions  (From admission, onward)         Start     Ordered   06/27/18 0000  Discharge wound care:    Comments:  You may apply a light dressing for minor discharge from the incision or to keep waistbands of clothing from rubbing.  You may also have been discharge with a clear dressing in which case this will be removed at your postoperative clinic visit.  You may shower, use soap on your incision.  Avoid baths or soaking the incision in the first 6 weeks following your surgery.Marland Kitchen   06/27/18 1041          Diet: routine diet  Activity: Advance as tolerated. Pelvic rest for 6 weeks.   Outpatient follow up: Follow-up Information    Will Bonnet, MD. Go on 06/30/2018.   Specialty:  Obstetrics and Gynecology Why:  Keep previously schedule post-op incision check appointment Contact information: [address] [place] [address] [phone]             Postpartum contraception: Tubal Ligation Rhogam Given postpartum: no Rubella vaccine given postpartum: no Varicella vaccine given postpartum: no TDaP given antepartum or postpartum: AP 05/06/2018  Newborn Data: Live born female  Birth Weight: 7 lb 13.9 oz (3570 g) APGAR: 8, 9  Newborn Delivery   Birth date/time:  06/24/2018 12:52:00 Delivery type:  C-Section, Low Transverse Trial of labor:  No C-section categorization:  Repeat     Baby Feeding: Breast and donor milk  Disposition: NICU but can likely room in today if bilirubin within normal limits. Probable discharge of baby tomorrow.  SIGNED: Avel Sensor, CNM 06/27/2018  3:31 PM

## 2018-06-24 NOTE — Anesthesia Procedure Notes (Signed)
Spinal  Patient location during procedure: OR End time: 06/24/2018 12:29 PM Staffing Anesthesiologist: Martha Clan, MD Resident/CRNA: Jonna Clark, CRNA Other anesthesia staff: Dorita Sciara, RN Performed: other anesthesia staff  Preanesthetic Checklist Completed: patient identified, site marked, surgical consent, pre-op evaluation, timeout performed, IV checked, risks and benefits discussed and monitors and equipment checked Spinal Block Patient position: sitting Prep: Betadine Patient monitoring: heart rate, continuous pulse ox, blood pressure and cardiac monitor Approach: midline Location: L4-5 Injection technique: single-shot Needle Needle type: Introducer and Pencan  Needle gauge: 25 G Needle length: 5 cm Assessment Sensory level: T4 Additional Notes Negative paresthesia. Negative blood return. Positive free-flowing CSF. Expiration date of kit checked and confirmed. Patient tolerated procedure well, without complications.

## 2018-06-24 NOTE — Transfer of Care (Signed)
Immediate Anesthesia Transfer of Care Note  Patient: Sonya Spencer  Procedure(s) Performed: CESAREAN SECTION WITH BILATERAL TUBAL LIGATION (N/A )  Patient Location: PACU and Mother/Baby  Anesthesia Type:Spinal  Level of Consciousness: awake, alert  and oriented  Airway & Oxygen Therapy: Patient Spontanous Breathing  Post-op Assessment: Report given to RN and Post -op Vital signs reviewed and stable  Post vital signs: Reviewed and stable  Last Vitals:  Vitals Value Taken Time  BP 109/60 06/24/2018  1:50 PM  Temp 36.4 C 06/24/2018  1:50 PM  Pulse    Resp 18 06/24/2018  1:50 PM  SpO2 99 % 06/24/2018  1:50 PM    Last Pain:  Vitals:   06/24/18 1350  TempSrc:   PainSc: 0-No pain         Complications: No apparent anesthesia complications

## 2018-06-24 NOTE — Anesthesia Preprocedure Evaluation (Signed)
Anesthesia Evaluation  Patient identified by MRN, date of birth, ID band Patient awake    Reviewed: Allergy & Precautions, NPO status , Patient's Chart, lab work & pertinent test results  History of Anesthesia Complications Negative for: history of anesthetic complications  Airway Mallampati: II  TM Distance: >3 FB Neck ROM: Full    Dental  (+) Chipped, Dental Advidsory Given   Pulmonary neg pulmonary ROS, former smoker,           Cardiovascular Exercise Tolerance: Good negative cardio ROS       Neuro/Psych PSYCHIATRIC DISORDERS Anxiety Depression negative neurological ROS     GI/Hepatic Neg liver ROS, GERD  Medicated and Controlled,  Endo/Other  diabetes, Gestational  Renal/GU negative Renal ROS     Musculoskeletal   Abdominal   Peds  Hematology  (+) anemia ,   Anesthesia Other Findings Past Medical History: No date: Allergy No date: Anemia No date: Anxiety No date: Cholelithiasis No date: Depression 03/25/2015: Fetal macrosomia during pregnancy in third trimester     Comment:  Resolved with delivery  No date: Frequent headaches No date: GERD (gastroesophageal reflux disease) No date: Gestational diabetes No date: H/O maternal third degree perineal laceration, currently  pregnant 10/10/11; 08/02/14: History of Papanicolaou smear of cervix     Comment:  neg, ct neg; neg ct//gc/tr neg 11/09/2017: Supervision of normal pregnancy     Comment:  [facility] Prenatal Labs Dating  Blood type:                  Genetic Screen 1 Screen:    AFP:     Quad:                   NIPS: Antibody:  Anatomic [place]  Rubella:   Varicella:                 GTT Early:               Third trimester:  RPR:                  Rhogam  HBsAg:    TDaP vaccine                       Flu               Shot: HIV:    Baby Food                                              GBS:  Contraception  Pap: CBB    CS/VBAC   Support                Person       No date: Urinary incontinence   Reproductive/Obstetrics (+) Pregnancy                             Anesthesia Physical  Anesthesia Plan  ASA: III  Anesthesia Plan: Spinal   Post-op Pain Management:    Induction:   PONV Risk Score and Plan:   Airway Management Planned: Natural Airway and Nasal Cannula  Additional Equipment:   Intra-op Plan:   Post-operative Plan:   Informed Consent: I have reviewed the patients History and Physical, chart, labs and discussed the procedure including the risks, benefits  and alternatives for the proposed anesthesia with the patient or authorized representative who has indicated his/her understanding and acceptance.     Plan Discussed with: Anesthesiologist, CRNA and Surgeon  Anesthesia Plan Comments:         Anesthesia Quick Evaluation

## 2018-06-24 NOTE — Interval H&P Note (Signed)
History and Physical Interval Note:  06/24/2018 12:08 PM  Sonya Spencer  has presented today for surgery, with the diagnosis of [place]  The various methods of treatment have been discussed with the patient and family. After consideration of risks, benefits and other options for treatment, the patient has consented to  Procedure(s): [place] (N/A) as a surgical intervention .  The patient's history has been reviewed, patient examined, no change in status, stable for surgery.  I have reviewed the patient's chart and labs.  Questions were answered to the patient's satisfaction.    32 y.o. [id number]  with undesired fertility, desires permanent sterilization.  Other reversible forms of contraception were discussed with patient; she declines all other modalities. Permanent nature of as well as associated risks of the procedure discussed with patient including but not limited to: risk of regret, permanence of method, bleeding, infection, injury to surrounding organs and need for additional procedures.  Failure risk of 0.5-1% with increased risk of ectopic gestation if pregnancy occurs was also discussed with patient.    Prentice Docker, MD, [facility], [facility] 06/24/2018 12:08 PM

## 2018-06-24 NOTE — Anesthesia Post-op Follow-up Note (Signed)
Anesthesia QCDR form completed.        

## 2018-06-24 NOTE — Op Note (Signed)
Cesarean Section Operative Note    Sonya Spencer   06/24/2018   Pre-operative Diagnosis:  1) intrauterine pregnancy at [redacted]w[redacted]d  2) uncontrolled gestational diabetes requiring treatment with insulin 3) history of cesarean delivery, desires repeat 4) desires permanent sterility   Post-operative Diagnosis:  1) intrauterine pregnancy at [redacted]w[redacted]d  2) uncontrolled gestational diabetes requiring treatment with insulin 3) history of cesarean delivery, desires repeat 4) desires permanent sterility   Procedure:  1) Repeat low transverse cesarean section via Pfannenstiel incision 2) Bilateral tubal ligation via Pomeroy method  Surgeon: Surgeon(s) and Role:    * Will Bonnet, MD - Primary  Assistants: Dr. Barnett Applebaum; No other capable assistant available, in surgery requiring high level assistant.  Anesthesia: spinal   Findings:  1) normal appearing gravid uterus, fallopian tubes, and ovaries 2) viable female infant with APGARs 8 and 9, weight 3,570 grams   Estimated Blood Loss: 950 mL  Total IV Fluids: 1,300 ml crystalloid  Specimens: segment of right and left fallopian tubes  Complications: no complications  Disposition: PACU - hemodynamically stable.   Maternal Condition: stable   Baby condition / location:  NICU due to transient tachypnea of the newborn  Procedure Details:  The patient was seen in the Holding Room. The risks, benefits, complications, treatment options, and expected outcomes were discussed with the patient. The patient concurred with the proposed plan, giving informed consent. identified as Sonya Spencer and the procedure verified as C-Section Delivery. A Time Out was held and the above information confirmed.   After induction of anesthesia, the patient was draped and prepped in the usual sterile manner. A Pfannenstiel incision was made and carried down through the subcutaneous tissue to the fascia. Fascial incision was made and extended transversely.  The fascia was separated from the underlying rectus tissue superiorly and inferiorly. The peritoneum was identified and entered. Peritoneal incision was extended longitudinally. The bladder flap was not bluntly or sharply freed from the lower uterine segment. A low transverse uterine incision was made and the hysterotomy was extended with cranial-caudal tension. Delivered from cephalic presentation was a 3,570 gram Living newborn infant(s) or Female with Apgar scores of 8 at one minute and 9 at five minutes. Cord ph was not sent the umbilical cord was clamped and cut cord blood was not obtained for evaluation. The placenta was removed Intact and appeared normal. The uterine outline, tubes and ovaries appeared normal. The uterine incision was closed with running locked sutures of 0 Vicryl.  Three figure-of-eight stitches of 0-vicryl were thrown to obtain hemostasis.  Hemostasis was assured.    The tubal ligation portion of the procedure was performed at this point.  The left fallopian tube was identified and followed out to the fimbriated end.  A Babcock clamp was used to grasp the tube in the mid-isthmic portion and two 2-0 plain gut sutures were used to ligate the tube.  An approximately 3cm segment of tube was removed with hemostasis assured.  The same procedure was performed on the right fallopian tube with hemostasis noted.    The uterus was returned to the abdomen and the paracolic gutters were cleared of all clots and debris.  The rectus muscles were inspected and found to be hemostatic.  The On-Q catheter pumps were inserted in accordance with the manufacturer's recommendations.  The catheters were inserted approximately 4cm cephelad to the incision line, approximately 1cm apart, straddling the midline.  They were inserted to a depth of the 4th mark. They were  positioned superficial to the rectus abdominus muscles and deep to the rectus fascia.    The fascia was then reapproximated with running sutures  of 1-0 PDS, looped. The subcutaneous tissue was reapproximated using 2-0 plain gut such that no greater than 2cm of dead space remained. The subcuticular closure was performed using 4-0 monocryl. The skin closure was reinforced using benzoin and 1/2" steri-strips.  The On-Q catheters were bolused with 5 mL of 0.5% marcaine plain for a total of 10 mL.  The catheters were affixed to the skin with surgical skin glue, steri-strips, and tegaderm.    Instrument, sponge, and needle counts were correct prior the abdominal closure and were correct at the conclusion of the case.  The patient received Ancef 2 gram IV prior to skin incision (within 30 minutes). For VTE prophylaxis she was wearing SCDs throughout the case.  The assistant surgeon was an MD due to lack of availability of another Counselling psychologist.   Signed: Will Bonnet, MD 06/24/2018 1:39 PM

## 2018-06-25 ENCOUNTER — Encounter: Payer: Self-pay | Admitting: Obstetrics and Gynecology

## 2018-06-25 LAB — CBC
HEMATOCRIT: 29.7 % — AB (ref 35.0–47.0)
HEMOGLOBIN: 10.2 g/dL — AB (ref 12.0–16.0)
MCH: 28.4 pg (ref 26.0–34.0)
MCHC: 34.3 g/dL (ref 32.0–36.0)
MCV: 83 fL (ref 80.0–100.0)
Platelets: 185 10*3/uL (ref 150–440)
RBC: 3.57 MIL/uL — ABNORMAL LOW (ref 3.80–5.20)
RDW: 15.3 % — ABNORMAL HIGH (ref 11.5–14.5)
WBC: 12.9 10*3/uL — ABNORMAL HIGH (ref 3.6–11.0)

## 2018-06-25 LAB — GLUCOSE, CAPILLARY
GLUCOSE-CAPILLARY: 88 mg/dL (ref 70–99)
GLUCOSE-CAPILLARY: 80 mg/dL (ref 70–99)
GLUCOSE-CAPILLARY: 117 mg/dL — AB (ref 70–99)
Glucose-Capillary: 101 mg/dL — ABNORMAL HIGH (ref 70–99)

## 2018-06-25 LAB — SURGICAL PATHOLOGY

## 2018-06-25 MED ORDER — ACETAMINOPHEN 500 MG PO TABS
1000.0000 mg | ORAL_TABLET | Freq: Four times a day (QID) | ORAL | Status: DC | PRN
  Administered 2018-06-25 – 2018-06-27 (×9): 1000 mg via ORAL
  Filled 2018-06-25 (×9): qty 2

## 2018-06-25 MED ORDER — IBUPROFEN 600 MG PO TABS
600.0000 mg | ORAL_TABLET | Freq: Four times a day (QID) | ORAL | Status: DC | PRN
  Administered 2018-06-25 – 2018-06-27 (×9): 600 mg via ORAL
  Filled 2018-06-25 (×9): qty 1

## 2018-06-25 MED ORDER — FERROUS SULFATE 325 (65 FE) MG PO TABS
325.0000 mg | ORAL_TABLET | Freq: Every day | ORAL | Status: DC
  Administered 2018-06-26 – 2018-06-27 (×2): 325 mg via ORAL
  Filled 2018-06-25 (×2): qty 1

## 2018-06-25 NOTE — Anesthesia Postprocedure Evaluation (Signed)
Anesthesia Post Note  Patient: Occupational psychologist  Procedure(s) Performed: CESAREAN SECTION WITH BILATERAL TUBAL LIGATION (N/A )  Patient location during evaluation: Mother Baby Anesthesia Type: Spinal Level of consciousness: awake and alert and oriented Pain management: pain level controlled Vital Signs Assessment: post-procedure vital signs reviewed and stable Respiratory status: spontaneous breathing Cardiovascular status: stable Postop Assessment: no backache, no apparent nausea or vomiting, adequate PO intake and able to ambulate Anesthetic complications: no     Last Vitals:  Vitals:   06/24/18 2319 06/25/18 0327  BP: 112/71 117/76  Pulse: 77 78  Resp: 20 20  Temp: 37.1 C (!) 36.4 C  SpO2: 99% 100%    Last Pain:  Vitals:   06/25/18 0725  TempSrc:   PainSc: 5                  Lanora Manis

## 2018-06-25 NOTE — Progress Notes (Signed)
Spoke with pharmacy, OK to give tylenol despite red dye allergy.

## 2018-06-25 NOTE — Plan of Care (Signed)
Vs stable; able to void once since catheter has been removed; taking iv toradol for pain control; ambulating to SCN to see baby; tolerating regular diet; MD order to check capillary glucose fasting and 2 hours post prandial

## 2018-06-25 NOTE — Lactation Note (Signed)
Lactation Consultation Note  Patient Name: Sonya Spencer RSWNI'O Date: 06/25/2018     Maternal Data  Mom is pumping breasts every 3 hrs with her electric  Breast pump, she states the symphony pump is not as effective at removing milk as hers, she has pumped 2.5 mls recently, encouraged to continue pumping and reassured she is doing well.   Feeding    LATCH Score                   Interventions    Lactation Tools Discussed/Used     Consult Status      Ferol Luz 06/25/2018, 6:32 PM

## 2018-06-25 NOTE — Progress Notes (Signed)
POD #1 s/p repeat Cesarean section and BTL Subjective:  OOB with some assist. Voiding without difficulty since foley removed. Tolerating regular diet. Baby in NICU-off CPAP. Breast and bottle feeding donor breast milk.  Objective:  Blood pressure 131/74, pulse 87, temperature 98 F (36.7 C), temperature source Oral, resp. rate 18, height 5\' 3"  (1.6 m), weight 94.3 kg, last menstrual period 10/03/2017, SpO2 98 %, unknown if currently breastfeeding.  General: WF in NAD, uncomfortable with movement, turning Pulmonary: no increased work of breathing, CTAB Heart: RRR without murmur Abdomen: soft, non-distended, BS active, fundus firm at level of umbilicus-1 and deviated to the left (has to void) Lochia: appropriate Incision: dressing C&D&I, ON Q intact with some bloody drainage under Tegaderm dressing Extremities: no edema, no erythema, no tenderness  Results for orders placed or performed during the hospital encounter of 06/24/18 (from the past 72 hour(s))  Glucose, capillary     Status: None   Collection Time: 06/24/18 12:09 PM  Result Value Ref Range   Glucose-Capillary 81 70 - 99 mg/dL  Glucose, capillary     Status: None   Collection Time: 06/24/18  2:32 PM  Result Value Ref Range   Glucose-Capillary 78 70 - 99 mg/dL  Glucose, capillary     Status: Abnormal   Collection Time: 06/24/18  9:53 PM  Result Value Ref Range   Glucose-Capillary 137 (H) 70 - 99 mg/dL  CBC     Status: Abnormal   Collection Time: 06/25/18  6:03 AM  Result Value Ref Range   WBC 12.9 (H) 3.6 - 11.0 K/uL   RBC 3.57 (L) 3.80 - 5.20 MIL/uL   Hemoglobin 10.2 (L) 12.0 - 16.0 g/dL   HCT 29.7 (L) 35.0 - 47.0 %   MCV 83.0 80.0 - 100.0 fL   MCH 28.4 26.0 - 34.0 pg   MCHC 34.3 32.0 - 36.0 g/dL   RDW 15.3 (H) 11.5 - 14.5 %   Platelets 185 150 - 440 K/uL    Comment: Performed at [facility], [address] [place], [place] [address]  Glucose, capillary     Status: None   Collection Time: 06/25/18  6:34  AM  Result Value Ref Range   Glucose-Capillary 88 70 - 99 mg/dL     Assessment:   32 y.o. [id number] postoperativeday # 1-stable GDM requiring insulin during pregnancy-postprandial CBG a little elevated yesterday, normal fasting BS  Plan:  1) Chronic anemia worsened by acute blood loss - hemodynamically stable and asymptomatic - po ferrous sulfate/ iron  2)A POS/ RI/ VI  3) TDAP UTD-given 05/06/2018   4) Breast & supplement with donor milk/BTL  5) Disposition-discharge home on POD 2 or [place], [place]

## 2018-06-26 LAB — GLUCOSE, CAPILLARY
GLUCOSE-CAPILLARY: 101 mg/dL — AB (ref 70–99)
Glucose-Capillary: 89 mg/dL (ref 70–99)
Glucose-Capillary: 73 mg/dL (ref 70–99)
Glucose-Capillary: 94 mg/dL (ref 70–99)

## 2018-06-26 LAB — HIV-1/2 AB - DIFFERENTIATION
HIV 1 Ab: NEGATIVE
HIV 2 Ab: NEGATIVE
Note: NEGATIVE

## 2018-06-26 LAB — RNA QUALITATIVE: HIV 1 RNA Qualitative: 1

## 2018-06-26 NOTE — Progress Notes (Signed)
Pt stated she felt shaky and dizzy to walk to SCN from her room, requested assistance. Steady gait, SBA to ambulate. VS checked, WNL, BG checked, 101. Pt states she feels fine now, "maybe just got up too fast". Asked pt to please call for assistance to walk back to room for safety.

## 2018-06-26 NOTE — Lactation Note (Signed)
This note was copied from a baby's chart. Lactation Consultation Note  Patient Name: Sonya Spencer ZDGLO'V Date: 06/26/2018 Reason for consult: Initial assessment;1st time breastfeeding;Early term 37-38.6wks;Mother's request;NICU baby Assisted mom with positioning with pillow support and breast feeding stool.  [name] latches with minimal assistance and begins strong rhythmic sucking with swallows.  Whenever he would slow down or stop sucking, demonstrated how to massage breast and stimulate [name] to keep him nutritively sucking at the breast.  Breast was softer after breast feeding for 15 minutes on left breast.  Once we tried to switch him to right breast, he was too sleepy.  Did kangaroo care with mom after breast feeding.  Discussed supply and demand, routine newborn feeding pattern and normal course of lactation.  Mom praised for her commitment to consistently pump and breast feed [name].  Lactation name and number written on white board and encouraged to call with any questions, concerns and assistance.  Maternal Data Formula Feeding for Exclusion: No Has patient been taught Hand Expression?: Yes Does the patient have breastfeeding experience prior to this delivery?: Yes  Feeding Feeding Type: Breast Fed Length of feed: 15 min  LATCH Score Latch: Repeated attempts needed to sustain latch, nipple held in mouth throughout feeding, stimulation needed to elicit sucking reflex.  Audible Swallowing: A few with stimulation  Type of Nipple: Everted at rest and after stimulation  Comfort (Breast/Nipple): Filling, red/small blisters or bruises, mild/mod discomfort  Hold (Positioning): Assistance needed to correctly position infant at breast and maintain latch.  LATCH Score: 6  Interventions Interventions: Breast feeding basics reviewed;Reverse pressure;DEBP;Assisted with latch;Breast compression;Coconut oil;Adjust position;Breast massage;Support pillows;Position options  Lactation Tools  Discussed/Used Tools: Pump;Coconut oil Breast pump type: Double-Electric Breast Pump WIC Program: Yes(Also has [employer]) Pump Review: Setup, frequency, and cleaning;Milk Storage   Consult Status Consult Status: Follow-up Follow-up type: Call as needed    Jarold Motto 06/26/2018, 3:45 PM

## 2018-06-27 LAB — GLUCOSE, CAPILLARY
GLUCOSE-CAPILLARY: 71 mg/dL (ref 70–99)
Glucose-Capillary: 104 mg/dL — ABNORMAL HIGH (ref 70–99)

## 2018-06-27 MED ORDER — IBUPROFEN 600 MG PO TABS: 600.0000 mg | ORAL_TABLET | Freq: Four times a day (QID) | ORAL | 3 refills | Status: DC | PRN

## 2018-06-27 NOTE — Lactation Note (Signed)
This note was copied from a baby's chart. Lactation Consultation Note  Patient Name: Sonya Spencer FHLKT'G Date: 06/27/2018 Reason for consult: Follow-up assessment;Early term 37-38.6wks;NICU baby;Other (Comment)(LGA baby) Assisted mom with keeping [name] awake and continuing with nutritive suck.  Mom pumping as much as 45 ml when she pumps.  She is mostly putting [name] to the breast first and then giving 20 ml after breast feed.  Did not see latch on left breast, but did see entire feeding on right breast.  When mom was feeding on left breast, mom was leaking mature milk from right breast.  When milk lets down, he gulps for short period before fatiguing.  Per pre and post feeding weight, he took in 12 ml at this feeding.  Continued to praise mom for her commitment and encouraged to call for assistance.   Maternal Data Formula Feeding for Exclusion: No Has patient been taught Hand Expression?: Yes Does the patient have breastfeeding experience prior to this delivery?: Yes  Feeding Feeding Type: Breast Fed Length of feed: 20 min  LATCH Score Latch: Grasps breast easily, tongue down, lips flanged, rhythmical sucking.  Audible Swallowing: Spontaneous and intermittent  Type of Nipple: Everted at rest and after stimulation  Comfort (Breast/Nipple): Filling, red/small blisters or bruises, mild/mod discomfort  Hold (Positioning): No assistance needed to correctly position infant at breast.  LATCH Score: 9  Interventions Interventions: Skin to skin;Breast compression;Coconut oil;Breast massage;Assisted with latch;Support pillows  Lactation Tools Discussed/Used Tools: Coconut oil;Comfort gels Breast pump type: Double-Electric Breast Pump WIC Program: Yes([facility] also has UHC) Pump Review: Setup, frequency, and cleaning;Milk Storage;Other (comment) Date initiated:: 06/24/18   Consult Status Consult Status: Follow-up Date: 06/27/18 Follow-up type: Call as needed    Jarold Motto 06/27/2018, 9:44 AM

## 2018-06-27 NOTE — Progress Notes (Signed)
Pt discharged with infant in SCN. Discharge instructions, prescriptions, and follow up appointments given to and reviewed with patient. Pt verbalized understanding. Pt visiting baby in SCN until after next feeding.

## 2018-06-30 ENCOUNTER — Ambulatory Visit: Payer: Commercial Managed Care - PPO | Admitting: Obstetrics and Gynecology

## 2018-07-01 NOTE — Telephone Encounter (Signed)
I am OK to approve the change from 9/26 to 9/16 for this patient.

## 2018-07-06 ENCOUNTER — Encounter: Payer: Self-pay | Admitting: Obstetrics and Gynecology

## 2018-07-06 ENCOUNTER — Ambulatory Visit (INDEPENDENT_AMBULATORY_CARE_PROVIDER_SITE_OTHER): Payer: Commercial Managed Care - PPO | Admitting: Obstetrics and Gynecology

## 2018-07-06 VITALS — BP 118/74 | Wt 200.0 lb

## 2018-07-06 DIAGNOSIS — Z09 Encounter for follow-up examination after completed treatment for conditions other than malignant neoplasm: Secondary | ICD-10-CM

## 2018-07-06 DIAGNOSIS — Z9889 Other specified postprocedural states: Secondary | ICD-10-CM

## 2018-07-06 DIAGNOSIS — O24419 Gestational diabetes mellitus in pregnancy, unspecified control: Secondary | ICD-10-CM

## 2018-07-06 NOTE — Progress Notes (Signed)
   Postoperative Follow-up Patient presents post op from cesarean section with tubal ligation  1 week ago.  Subjective: She denies fever, chills, nausea and vomiting. Eating a regular diet without difficulty. Pain is controlled with current analgesics. Medications being used: acetaminophen and aspirin.  Activity: increasing slowly. She denies issues with her incision.  She has been having a headache.  Usually it happens when she is taking her pain medication. It is worse when lying down, better when sitting up. She has stopped taking pain medication and her headache is much improved.  Objective: BP 118/74   Wt 200 lb (90.7 kg)   BMI 35.43 kg/m   Constitutional: Well nourished, well developed female in no acute distress.  HEENT: normal Skin: Warm and dry.  Abdomen: S/NT/ND, uterine fundus firm at U-3 clean, dry, intact and without erythema, induration, warmth, and tenderness Extremity: no edema   Assessment: 32 y.o. s/p cesarean section with tubal ligation progressing well  Plan: Patient has done well after surgery with no apparent complications.  I have discussed the post-operative course to date, and the expected progress moving forward.  The patient understands what complications to be concerned about.    Headache sounds more like a tension type, possibly rebound headache. BPs normal. No visual changes.  No other symptoms of preeclampsia. Continue to monitor.   Activity plan: increase slowly  Return in about 4 weeks (around 08/03/2018) for 2 hour glucose tolerance test and Six Week Postpartum.  Prentice Docker, MD 07/06/2018 11:37 AM

## 2018-08-03 ENCOUNTER — Other Ambulatory Visit: Payer: Commercial Managed Care - PPO

## 2018-08-03 ENCOUNTER — Ambulatory Visit: Payer: Commercial Managed Care - PPO | Admitting: Obstetrics and Gynecology

## 2018-08-09 NOTE — Telephone Encounter (Signed)
It's hard to tell much from this picture. If she has a concern, she just need to make an appointment to be evaluated.  Thanks!

## 2018-08-16 ENCOUNTER — Encounter: Payer: Self-pay | Admitting: Obstetrics and Gynecology

## 2018-08-16 ENCOUNTER — Other Ambulatory Visit: Payer: Commercial Managed Care - PPO

## 2018-08-16 ENCOUNTER — Ambulatory Visit (INDEPENDENT_AMBULATORY_CARE_PROVIDER_SITE_OTHER): Payer: Commercial Managed Care - PPO | Admitting: Obstetrics and Gynecology

## 2018-08-16 ENCOUNTER — Other Ambulatory Visit (HOSPITAL_COMMUNITY)
Admission: RE | Admit: 2018-08-16 | Discharge: 2018-08-16 | Disposition: A | Discharge status: Home / Self Care | Payer: Commercial Managed Care - PPO | Source: Ambulatory Visit | Attending: Obstetrics and Gynecology | Admitting: Obstetrics and Gynecology

## 2018-08-16 ENCOUNTER — Other Ambulatory Visit: Payer: Self-pay | Admitting: Obstetrics and Gynecology

## 2018-08-16 ENCOUNTER — Other Ambulatory Visit: Payer: Self-pay

## 2018-08-16 DIAGNOSIS — O24419 Gestational diabetes mellitus in pregnancy, unspecified control: Secondary | ICD-10-CM

## 2018-08-16 DIAGNOSIS — O0990 Supervision of high risk pregnancy, unspecified, unspecified trimester: Secondary | ICD-10-CM

## 2018-08-16 DIAGNOSIS — F329 Major depressive disorder, single episode, unspecified: Secondary | ICD-10-CM

## 2018-08-16 DIAGNOSIS — Z124 Encounter for screening for malignant neoplasm of cervix: Secondary | ICD-10-CM

## 2018-08-16 DIAGNOSIS — R7309 Other abnormal glucose: Secondary | ICD-10-CM

## 2018-08-16 DIAGNOSIS — F419 Anxiety disorder, unspecified: Secondary | ICD-10-CM

## 2018-08-16 MED ORDER — ESCITALOPRAM OXALATE 10 MG PO TABS: 20.0000 mg | ORAL_TABLET | Freq: Every day | ORAL | 0 refills | Status: DC

## 2018-08-16 MED ORDER — BUPROPION HCL ER (XL) 150 MG PO TB24: 150.0000 mg | ORAL_TABLET | Freq: Every day | ORAL | 1 refills | Status: DC

## 2018-08-16 NOTE — Progress Notes (Signed)
Postpartum Visit   Chief Complaint  Patient presents with  . 6 week post partum   History of Present Illness: Patient is a 32 y.o. [id number] presents for postpartum visit.  Date of delivery: 06/24/2018 Type of delivery: C-Section Episiotomy No.  Laceration: no Pregnancy or labor problems: gestational DM  Any problems since the delivery: depression, some early cutting behavior about 4 weeks ago. She states that she has this under good control now.  Her EPDS is 20. She started Lexapro 2 days ago (10 mg).  She denies SI/HI.    Newborn Details:  SINGLETON :  1. 71 name: [name]. Birth weight: 7.14 Maternal Details:  Breast Feeding:  yes Post partum depression/anxiety noted:  Yes depression and anxiety. On medication Edinburgh Post-Partum Depression Score:  20  Date of last PAP: 4 years  normal   Past Medical History:  Diagnosis Date  . Allergy   . Anemia   . Anxiety   . Cholelithiasis   . Depression   . Fetal macrosomia during pregnancy in third trimester 03/25/2015   Resolved with delivery   . Frequent headaches   . GERD (gastroesophageal reflux disease)   . Gestational diabetes   . H/O maternal third degree perineal laceration, currently pregnant   . History of Papanicolaou smear of cervix 10/10/11; 08/02/14   neg, ct neg; neg ct//gc/tr neg  . Supervision of normal pregnancy 11/09/2017   Clinic [facility] Prenatal Labs Dating  Blood type:    Genetic Screen 1 Screen:    AFP:     Quad:     NIPS: Antibody:  Anatomic [place]  Rubella:   Varicella:   GTT Early:               Third trimester:  RPR:    Rhogam  HBsAg:    TDaP vaccine                       Flu Shot: HIV:    Baby Food                                GBS:  Contraception  Pap: CBB    CS/VBAC   Support Person        . Urinary incontinence     Past Surgical History:  Procedure Laterality Date  . CESAREAN SECTION N/A 03/22/2015   Procedure: CESAREAN SECTION;  Surgeon: Will Bonnet, MD;  Location: [facility] ORS;  Service:  Obstetrics;  Laterality: N/A;  . CESAREAN SECTION WITH BILATERAL TUBAL LIGATION N/A 06/24/2018   Procedure: CESAREAN SECTION WITH BILATERAL TUBAL LIGATION;  Surgeon: Will Bonnet, MD;  Location: [facility] ORS;  Service: Obstetrics;  Laterality: N/A;    Prior to Admission medications   Medication Sig Start Date End Date Taking? Authorizing Provider  cetirizine (ZYRTEC) 10 MG tablet Take 10 mg by mouth every evening.    Yes [provider]  escitalopram (LEXAPRO) 10 MG tablet Take 1 tablet (10 mg total) by mouth daily. Patient taking differently: Take 10 mg by mouth every evening.  05/06/18  Yes Rexene Agent, CNM  ibuprofen (ADVIL,MOTRIN) 600 MG tablet Take 1 tablet (600 mg total) by mouth every 6 (six) hours as needed. 06/27/18  Yes Rexene Agent, CNM  Prenatal Vit-Fe Fumarate-FA (PRENATAL PO) Take 3 tablets by mouth every evening.   Yes [provider]  Melatonin 5 MG CAPS Take 5-10 mg by mouth at  bedtime as needed (sleep).     [provider]    Allergies  Allergen Reactions  . Nickel Rash  . Red Dye Rash    Includes Benadryl as it has red dye in it.     Social History   Socioeconomic History  . Marital status: Married    Spouse name: Not on file  . Number of children: 3  . Years of education: 46  . Highest education level: Not on file  Occupational History  . Occupation: HOMEMAKER  Social Needs  . Financial resource strain: Not on file  . Food insecurity:    Worry: Not on file    Inability: Not on file  . Transportation needs:    Medical: Not on file    Non-medical: Not on file  Tobacco Use  . Smoking status: Former Smoker    Packs/day: 0.25    Years: 2.00    Pack years: 0.50    Types: Cigarettes    Last attempt to quit: 03/13/2009    Years since quitting: 9.4  . Smokeless tobacco: Never Used  Substance and Sexual Activity  . Alcohol use: Not Currently    Comment: not during pregnancy  . Drug use: No  . Sexual activity: Yes    Lifestyle  . Physical activity:    Days per week: Not on file    Minutes per session: Not on file  . Stress: Not on file  Relationships  . Social connections:    Talks on phone: Not on file    Gets together: Not on file    Attends religious service: Not on file    Active member of club or organization: Not on file    Attends meetings of clubs or organizations: Not on file    Relationship status: Not on file  . Intimate partner violence:    Fear of current or ex partner: Not on file    Emotionally abused: Not on file    Physically abused: Not on file    Forced sexual activity: Not on file  Other Topics Concern  . Not on file  Social History Narrative  . Not on file    Family History  Problem Relation Age of Onset  . Lupus Mother   . Hepatitis C Mother   . Depression Mother   . Diabetes type I Father   . Diabetes Father        type 1  . Thyroid disease Father   . Lupus Sister   . Lupus Maternal Grandmother   . Diabetes Maternal Grandmother   . Diabetes type I Daughter   . Diabetes Daughter   . Lupus Maternal Aunt   . Diabetes Paternal Grandfather        type 1  . Diabetes Paternal Aunt        type 1 - runs in father's side    Review of Systems  Constitutional: Negative.   HENT: Negative.   Eyes: Negative.   Respiratory: Negative.   Cardiovascular: Negative.   Gastrointestinal: Negative.   Genitourinary: Negative.   Musculoskeletal: Negative.   Skin: Negative.   Neurological: Negative.   Psychiatric/Behavioral: Positive for depression. Negative for hallucinations, memory loss, substance abuse and suicidal ideas. The patient is nervous/anxious. The patient does not have insomnia.      Physical Exam BP 118/74   Ht 5\' 3"  (1.6 m)   Wt 204 lb (92.5 kg)   BMI 36.14 kg/m   Physical Exam  Constitutional: She is oriented  to person, place, and time. She appears well-developed and well-nourished. No distress.  Genitourinary: Vagina normal and uterus normal.  Pelvic exam was performed with patient supine. There is no rash, tenderness, lesion or injury on the right labia. There is no rash, tenderness, lesion or injury on the left labia. Right adnexum does not display mass, does not display tenderness and does not display fullness. Left adnexum does not display mass, does not display tenderness and does not display fullness. Cervix does not exhibit motion tenderness, lesion or polyp.   Uterus is mobile. Uterus is not enlarged, tender, exhibiting a mass or irregular (is regular).  HENT:  Head: Normocephalic and atraumatic.  Eyes: EOM are normal. No scleral icterus.  Neck: Normal range of motion. Neck supple.  Cardiovascular: Normal rate and regular rhythm.  Pulmonary/Chest: Effort normal and breath sounds normal. No respiratory distress. She has no wheezes. She has no rales.  Abdominal: Soft. Bowel sounds are normal. She exhibits no distension and no mass. There is no tenderness. There is no rebound and no guarding.    Musculoskeletal: Normal range of motion. She exhibits no edema.  Neurological: She is alert and oriented to person, place, and time. No cranial nerve deficit.  Skin: Skin is warm and dry. No erythema.  Psychiatric: She has a normal mood and affect. Her behavior is normal. Judgment normal.     Female Chaperone present during breast and/or pelvic exam.  Assessment: 32 y.o. [id number] presenting for 6 week postpartum visit  Plan: Problem List Items Addressed This Visit      Other   Anxiety and depression   Relevant Medications   escitalopram (LEXAPRO) 10 MG tablet   buPROPion (WELLBUTRIN XL) 150 MG 24 hr tablet   Supervision of high risk pregnancy, antepartum   Relevant Medications   escitalopram (LEXAPRO) 10 MG tablet   buPROPion (WELLBUTRIN XL) 150 MG 24 hr tablet    Other Visit Diagnoses    Postpartum care and examination    -  Primary   Relevant Orders   Cytology - PAP   Pap smear for cervical cancer screening        Relevant Orders   Cytology - PAP      1) Contraception: status post BTL.  2)  Pap - ASCCP guidelines and rational discussed.  Patient opts for routine screening interval. Pap smear obtained today.  3) Patient underwent screening for postpartum depression with concerns noted.  Will continue Lexapro and cautiously start Wellbutrin. She has tried this regimen before with breastfeeding. She was given precautions for irritability, jitteryness, etc in the baby  4) Follow up 4 weeks for medication management.  Prentice Docker, MD 08/16/2018 1:08 PM

## 2018-08-17 LAB — GLUCOSE TOLERANCE, 2 HOURS
GLUCOSE FASTING GTT: 82 mg/dL (ref 65–99)
GLUCOSE, 2 HOUR: 94 mg/dL (ref 65–139)

## 2018-08-18 LAB — CYTOLOGY - PAP
DIAGNOSIS: NEGATIVE
HPV: NOT DETECTED

## 2018-09-03 ENCOUNTER — Other Ambulatory Visit: Payer: Self-pay | Admitting: Nurse Practitioner

## 2018-09-03 DIAGNOSIS — F419 Anxiety disorder, unspecified: Principal | ICD-10-CM

## 2018-09-03 DIAGNOSIS — F329 Major depressive disorder, single episode, unspecified: Secondary | ICD-10-CM

## 2018-09-03 DIAGNOSIS — F32A Depression, unspecified: Secondary | ICD-10-CM

## 2018-09-03 NOTE — Telephone Encounter (Signed)
Patient has seen GYN 08/16/18 in postpartum, and she has restarted Lexapro 20mg  daily (two of the 10mg  tabs per insurance)  Called patient to confirm dose as it was requested to me as 10mg  daily only  Confirmed and sent rx 10mg  tab x 2 daily = 20mg   Nobie Putnam, DO [facility] 09/03/2018, 6:14 PM

## 2018-09-06 ENCOUNTER — Other Ambulatory Visit: Payer: Self-pay | Admitting: Nurse Practitioner

## 2018-09-06 DIAGNOSIS — F329 Major depressive disorder, single episode, unspecified: Secondary | ICD-10-CM

## 2018-09-06 DIAGNOSIS — F32A Depression, unspecified: Secondary | ICD-10-CM

## 2018-09-06 DIAGNOSIS — F419 Anxiety disorder, unspecified: Principal | ICD-10-CM

## 2018-09-09 ENCOUNTER — Other Ambulatory Visit: Payer: Self-pay | Admitting: Obstetrics and Gynecology

## 2018-09-09 DIAGNOSIS — F329 Major depressive disorder, single episode, unspecified: Secondary | ICD-10-CM

## 2018-09-09 DIAGNOSIS — F419 Anxiety disorder, unspecified: Principal | ICD-10-CM

## 2018-09-09 DIAGNOSIS — O0990 Supervision of high risk pregnancy, unspecified, unspecified trimester: Secondary | ICD-10-CM

## 2018-09-09 MED ORDER — ESCITALOPRAM OXALATE 20 MG PO TABS: 20.0000 mg | ORAL_TABLET | Freq: Every day | ORAL | 0 refills | Status: DC

## 2018-09-09 NOTE — Telephone Encounter (Signed)
advise

## 2018-09-10 ENCOUNTER — Other Ambulatory Visit: Payer: Self-pay

## 2018-09-10 ENCOUNTER — Other Ambulatory Visit: Payer: Self-pay | Admitting: Nurse Practitioner

## 2018-09-10 DIAGNOSIS — F419 Anxiety disorder, unspecified: Principal | ICD-10-CM

## 2018-09-10 DIAGNOSIS — F329 Major depressive disorder, single episode, unspecified: Secondary | ICD-10-CM

## 2018-09-13 ENCOUNTER — Ambulatory Visit: Payer: Commercial Managed Care - PPO | Admitting: Obstetrics and Gynecology

## 2018-09-28 ENCOUNTER — Other Ambulatory Visit: Payer: Self-pay | Admitting: Obstetrics and Gynecology

## 2018-09-28 DIAGNOSIS — O0990 Supervision of high risk pregnancy, unspecified, unspecified trimester: Secondary | ICD-10-CM

## 2018-09-28 DIAGNOSIS — F329 Major depressive disorder, single episode, unspecified: Secondary | ICD-10-CM

## 2018-09-28 DIAGNOSIS — F419 Anxiety disorder, unspecified: Principal | ICD-10-CM

## 2018-09-28 MED ORDER — BUPROPION HCL ER (XL) 300 MG PO TB24: 300.0000 mg | ORAL_TABLET | Freq: Every day | ORAL | 0 refills | Status: DC

## 2018-09-29 ENCOUNTER — Emergency Department: Payer: Commercial Managed Care - PPO

## 2018-09-29 ENCOUNTER — Other Ambulatory Visit: Payer: Self-pay

## 2018-09-29 ENCOUNTER — Encounter: Payer: Self-pay | Admitting: Emergency Medicine

## 2018-09-29 ENCOUNTER — Emergency Department
Admission: EM | Admit: 2018-09-29 | Discharge: 2018-09-29 | Disposition: A | Discharge status: Home / Self Care | Payer: Commercial Managed Care - PPO | Attending: Emergency Medicine | Admitting: Emergency Medicine

## 2018-09-29 DIAGNOSIS — F419 Anxiety disorder, unspecified: Secondary | ICD-10-CM | POA: Insufficient documentation

## 2018-09-29 DIAGNOSIS — R112 Nausea with vomiting, unspecified: Secondary | ICD-10-CM | POA: Diagnosis not present

## 2018-09-29 DIAGNOSIS — F329 Major depressive disorder, single episode, unspecified: Secondary | ICD-10-CM | POA: Diagnosis not present

## 2018-09-29 DIAGNOSIS — R079 Chest pain, unspecified: Secondary | ICD-10-CM | POA: Diagnosis not present

## 2018-09-29 DIAGNOSIS — Z87891 Personal history of nicotine dependence: Secondary | ICD-10-CM | POA: Insufficient documentation

## 2018-09-29 DIAGNOSIS — R0602 Shortness of breath: Secondary | ICD-10-CM | POA: Diagnosis not present

## 2018-09-29 DIAGNOSIS — Z79899 Other long term (current) drug therapy: Secondary | ICD-10-CM | POA: Insufficient documentation

## 2018-09-29 DIAGNOSIS — R0789 Other chest pain: Secondary | ICD-10-CM | POA: Diagnosis not present

## 2018-09-29 DIAGNOSIS — R1013 Epigastric pain: Secondary | ICD-10-CM | POA: Insufficient documentation

## 2018-09-29 LAB — URINALYSIS, COMPLETE (UACMP) WITH MICROSCOPIC
Bacteria, UA: NONE SEEN
Bilirubin Urine: NEGATIVE
Glucose, UA: NEGATIVE mg/dL
Ketones, ur: NEGATIVE mg/dL
Nitrite: NEGATIVE
Protein, ur: NEGATIVE mg/dL
SQUAMOUS EPITHELIAL / LPF: NONE SEEN (ref 0–5)
Specific Gravity, Urine: 1.002 — ABNORMAL LOW (ref 1.005–1.030)
pH: 7 (ref 5.0–8.0)

## 2018-09-29 LAB — HEPATIC FUNCTION PANEL
ALT: 135 U/L — ABNORMAL HIGH (ref 0–44)
AST: 264 U/L — ABNORMAL HIGH (ref 15–41)
Albumin: 4.5 g/dL (ref 3.5–5.0)
Alkaline Phosphatase: 101 U/L (ref 38–126)
Bilirubin, Direct: <0.1 mg/dL (ref 0.0–0.2)
TOTAL PROTEIN: 7.7 g/dL (ref 6.5–8.1)
Total Bilirubin: 0.4 mg/dL (ref 0.3–1.2)

## 2018-09-29 LAB — CBC
HCT: 39.8 % (ref 36.0–46.0)
Hemoglobin: 13 g/dL (ref 12.0–15.0)
MCH: 27.1 pg (ref 26.0–34.0)
MCHC: 32.7 g/dL (ref 30.0–36.0)
MCV: 82.9 fL (ref 80.0–100.0)
Platelets: 369 10*3/uL (ref 150–400)
RBC: 4.8 MIL/uL (ref 3.87–5.11)
RDW: 14.4 % (ref 11.5–15.5)
WBC: 11.9 10*3/uL — ABNORMAL HIGH (ref 4.0–10.5)
nRBC: 0 % (ref 0.0–0.2)

## 2018-09-29 LAB — BASIC METABOLIC PANEL
ANION GAP: 9 (ref 5–15)
BUN: 14 mg/dL (ref 6–20)
CHLORIDE: 105 mmol/L (ref 98–111)
CO2: 21 mmol/L — ABNORMAL LOW (ref 22–32)
Calcium: 9.1 mg/dL (ref 8.9–10.3)
Creatinine, Ser: 0.6 mg/dL (ref 0.44–1.00)
GFR calc Af Amer: >60 mL/min (ref >60)
GFR calc non Af Amer: >60 mL/min (ref >60)
Glucose, Bld: 93 mg/dL (ref 70–99)
Potassium: 4 mmol/L (ref 3.5–5.1)
Sodium: 135 mmol/L (ref 135–145)

## 2018-09-29 LAB — POCT PREGNANCY, URINE: Preg Test, Ur: NEGATIVE

## 2018-09-29 LAB — LIPASE, BLOOD: Lipase: 50 U/L (ref 11–51)

## 2018-09-29 LAB — TROPONIN I: Troponin I: <0.03 ng/mL (ref <0.03)

## 2018-09-29 MED ORDER — LIDOCAINE VISCOUS HCL 2 % MT SOLN
15.0000 mL | Freq: Once | OROMUCOSAL | Status: AC
  Administered 2018-09-29: 15 mL via ORAL
  Filled 2018-09-29: qty 15

## 2018-09-29 MED ORDER — ALUM & MAG HYDROXIDE-SIMETH 200-200-20 MG/5ML PO SUSP
30.0000 mL | Freq: Once | ORAL | Status: AC
  Administered 2018-09-29: 30 mL via ORAL
  Filled 2018-09-29 (×2): qty 30

## 2018-09-29 MED ORDER — RANITIDINE HCL 300 MG PO TABS: 300.0000 mg | ORAL_TABLET | Freq: Every day | ORAL | 1 refills | Status: DC

## 2018-09-29 NOTE — ED Provider Notes (Signed)
[facility] Emergency Department Provider Note   ____________________________________________   First MD Initiated Contact with Patient 09/29/18 2116     (approximate)  I have reviewed the triage vital signs and the nursing notes.   HISTORY  Chief Complaint Chest Pain    HPI Sonya Spencer is a 33 y.o. female patient reports onset of epigastric pain with some nausea and vomiting at 1:00 today.  She felt flushed flushed and some shortness of breath.  In fact she reports that for the last couple days she does not seem to go to get a full breath.  She is not been coughing she has not had a fever she did have gallstones that were discovered on 1 of her prior pregnancies.  She never had pain with them.  She took ranitidine earlier today and now the pain in her epigastric area is much better.  She has been on ranitidine during her pregnancy.  She is still breast-feeding.  The pain when it came on with sharp stabbing and deep inside her belly.  Was all in the epigastric area.  Past Medical History:  Diagnosis Date  . Allergy   . Anemia   . Anxiety   . Cholelithiasis   . Depression   . Fetal macrosomia during pregnancy in third trimester 03/25/2015   Resolved with delivery   . Frequent headaches   . GERD (gastroesophageal reflux disease)   . Gestational diabetes   . H/O maternal third degree perineal laceration, currently pregnant   . History of Papanicolaou smear of cervix 10/10/11; 08/02/14   neg, ct neg; neg ct//gc/tr neg  . Supervision of normal pregnancy 11/09/2017   Clinic [facility] Prenatal Labs Dating  Blood type:    Genetic Screen 1 Screen:    AFP:     Quad:     NIPS: Antibody:  Anatomic [place]  Rubella:   Varicella:   GTT Early:               Third trimester:  RPR:    Rhogam  HBsAg:    TDaP vaccine                       Flu Shot: HIV:    Baby Food                                GBS:  Contraception  Pap: CBB    CS/VBAC   Support Person        . Urinary  incontinence     Patient Active Problem List   Diagnosis Date Noted  . Low-lying placenta 05/12/2018  . Fall (on) (from) other stairs and steps, initial encounter 03/05/2018  . Gestational diabetes mellitus (GDM) affecting third pregnancy 02/05/2018  . Abnormal glucose tolerance test (GTT) during pregnancy, antepartum 12/23/2017  . Supervision of high risk pregnancy, antepartum 11/16/2017  . History of macrosomia in infant in prior pregnancy, currently pregnant 11/09/2017  . Nausea and vomiting during pregnancy 11/09/2017  . History of cesarean delivery 11/09/2017  . Lipoma of axilla 06/22/2017  . Anxiety and depression 05/11/2017  . Frequent headaches 05/11/2017    Past Surgical History:  Procedure Laterality Date  . CESAREAN SECTION N/A 03/22/2015   Procedure: CESAREAN SECTION;  Surgeon: Will Bonnet, MD;  Location: [facility] ORS;  Service: Obstetrics;  Laterality: N/A;  . CESAREAN SECTION WITH BILATERAL TUBAL LIGATION N/A 06/24/2018   Procedure:  CESAREAN SECTION WITH BILATERAL TUBAL LIGATION;  Surgeon: Will Bonnet, MD;  Location: [facility] ORS;  Service: Obstetrics;  Laterality: N/A;    Prior to Admission medications   Medication Sig Start Date End Date Taking? Authorizing Provider  buPROPion (WELLBUTRIN XL) 300 MG 24 hr tablet Take 1 tablet (300 mg total) by mouth daily. 09/28/18   Will Bonnet, MD  cetirizine (ZYRTEC) 10 MG tablet Take 10 mg by mouth every evening.     [provider]  escitalopram (LEXAPRO) 20 MG tablet Take 1 tablet (20 mg total) by mouth daily. 09/09/18   Will Bonnet, MD  ibuprofen (ADVIL,MOTRIN) 600 MG tablet Take 1 tablet (600 mg total) by mouth every 6 (six) hours as needed. 06/27/18   Rexene Agent, CNM  Melatonin 5 MG CAPS Take 5-10 mg by mouth at bedtime as needed (sleep).     [provider]  Prenatal Vit-Fe Fumarate-FA (PRENATAL PO) Take 3 tablets by mouth every evening.    [provider]  ranitidine  (ZANTAC) 300 MG tablet Take 1 tablet (300 mg total) by mouth at bedtime. 09/29/18 09/29/19  Nena Polio, MD    Allergies Nickel and Red dye  Family History  Problem Relation Age of Onset  . Lupus Mother   . Hepatitis C Mother   . Depression Mother   . Diabetes type I Father   . Diabetes Father        type 1  . Thyroid disease Father   . Lupus Sister   . Lupus Maternal Grandmother   . Diabetes Maternal Grandmother   . Diabetes type I Daughter   . Diabetes Daughter   . Lupus Maternal Aunt   . Diabetes Paternal Grandfather        type 1  . Diabetes Paternal Aunt        type 1 - runs in father's side    Social History Social History   Tobacco Use  . Smoking status: Former Smoker    Packs/day: 0.25    Years: 2.00    Pack years: 0.50    Types: Cigarettes    Last attempt to quit: 03/13/2009    Years since quitting: 9.5  . Smokeless tobacco: Never Used  Substance Use Topics  . Alcohol use: Not Currently    Comment: not during pregnancy  . Drug use: No    Review of Systems  Constitutional: No fever/chills Eyes: No visual changes. ENT: No sore throat. Cardiovascular: Denies chest pain. Respiratory: Denies shortness of breath. Gastrointestinal: No abdominal pain.  No nausea, no vomiting.  No diarrhea.  No constipation. Genitourinary: Negative for dysuria. Musculoskeletal: Negative for back pain. Skin: Negative for rash. Neurological: Negative for headaches, focal weakness   ____________________________________________   PHYSICAL EXAM:  VITAL SIGNS: ED Triage Vitals [09/29/18 1930]  Enc Vitals Group     BP 132/75     Pulse Rate 93     Resp 16     Temp 98.2 F (36.8 C)     Temp Source Oral     SpO2 98 %     Weight 210 lb (95.3 kg)     Height 5\' 3"  (1.6 m)     Head Circumference      Peak Flow      Pain Score 4     Pain Loc      Pain Edu?      Excl. in [place]?     Constitutional: Alert and oriented. Well appearing and  in no acute distress. Eyes:  Conjunctivae are normal. P Head: Atraumatic. Nose: No congestion/rhinnorhea. Mouth/Throat: Mucous membranes are moist.  Oropharynx non-erythematous. Neck: No stridor.   Cardiovascular: Normal rate, regular rhythm. Grossly normal heart sounds.  Good peripheral circulation. Respiratory: Normal respiratory effort.  No retractions. Lungs CTAB. Gastrointestinal: Soft and nontender except for any epigastric area.  The tenderness there is mild.. No distention. No abdominal bruits. No CVA tenderness. Musculoskeletal: No lower extremity tenderness nor edema.  No joint effusions. Neurologic:  Normal speech and language. No gross focal neurologic deficits are appreciated.  Skin:  Skin is warm, dry and intact. No rash noted. Psychiatric: Mood and affect are normal. Speech and behavior are normal.  ____________________________________________   LABS (all labs ordered are listed, but only abnormal results are displayed)  Labs Reviewed  URINALYSIS, COMPLETE (UACMP) WITH MICROSCOPIC - Abnormal; Notable for the following components:      Result Value   Color, Urine COLORLESS (*)    APPearance CLEAR (*)    Specific Gravity, Urine 1.002 (*)    Hgb urine dipstick SMALL (*)    Leukocytes, UA LARGE (*)    All other components within normal limits  BASIC METABOLIC PANEL - Abnormal; Notable for the following components:   CO2 21 (*)    All other components within normal limits  CBC - Abnormal; Notable for the following components:   WBC 11.9 (*)    All other components within normal limits  HEPATIC FUNCTION PANEL - Abnormal; Notable for the following components:   AST 264 (*)    ALT 135 (*)    All other components within normal limits  TROPONIN I  LIPASE, BLOOD  POC URINE PREG, ED  POCT PREGNANCY, URINE   ____________________________________________  EKG  KG read and interpreted by me shows normal sinus rhythm rate of 6 essentially normal  EKG ____________________________________________  RADIOLOGY  ED MD interpretation: Rest x-ray read by radiology reviewed by me shows no acute disease Official radiology report(s): Dg Chest 2 View  Result Date: 09/29/2018 CLINICAL DATA:  Chest pain, short of breath EXAM: CHEST - 2 VIEW COMPARISON:  None. FINDINGS: The heart size and mediastinal contours are within normal limits. Both lungs are clear. The visualized skeletal structures are unremarkable. IMPRESSION: No active cardiopulmonary disease. Electronically Signed   By: Donavan Foil M.D.   On: 09/29/2018 19:49    ____________________________________________   PROCEDURES  Procedure(s) performed:  Procedures  Critical Care performed:   ____________________________________________   INITIAL IMPRESSION / ASSESSMENT AND PLAN / ED COURSE Patient's pain improved with GI cocktail.  Liver function tests are normal.  I will discharge the patient on her ranitidine.  She will follow-up with her doctor come back for worse pain fever vomiting or any other problems.  This sounds more like a stomach problem than her gallbladder.  Probably gastritis.         ____________________________________________   FINAL CLINICAL IMPRESSION(S) / ED DIAGNOSES  Final diagnoses:  Epigastric pain     ED Discharge Orders         Ordered    ranitidine (ZANTAC) 300 MG tablet  Daily at bedtime     09/29/18 2131           Note:  This document was prepared using Dragon voice recognition software and may include unintentional dictation errors.    Nena Polio, MD 09/29/18 2159

## 2018-09-29 NOTE — Discharge Instructions (Addendum)
Please return for worse pain fever vomiting.  Please follow-up with your regular doctor.  Use the Zantac 1 a day for the next few weeks.

## 2018-09-29 NOTE — ED Triage Notes (Signed)
FIRST NURSE NOTE-epigastric/chest pain. Pt alert and oriented.  NAD. Unlabored.

## 2018-09-29 NOTE — ED Triage Notes (Signed)
Pt in via POV with complaints sudden onset epigastric pain w/ N/V.  Pt report feeling flushed and short of breath with pain.  Vitals WDL, NAD noted at this time.

## 2018-09-30 ENCOUNTER — Emergency Department
Admission: EM | Admit: 2018-09-30 | Discharge: 2018-09-30 | Disposition: A | Discharge status: Home / Self Care | Payer: Commercial Managed Care - PPO | Attending: Emergency Medicine | Admitting: Emergency Medicine

## 2018-09-30 ENCOUNTER — Other Ambulatory Visit: Payer: Self-pay

## 2018-09-30 ENCOUNTER — Emergency Department: Payer: Commercial Managed Care - PPO

## 2018-09-30 ENCOUNTER — Encounter: Payer: Self-pay | Admitting: Emergency Medicine

## 2018-09-30 DIAGNOSIS — R110 Nausea: Secondary | ICD-10-CM | POA: Insufficient documentation

## 2018-09-30 DIAGNOSIS — R1011 Right upper quadrant pain: Secondary | ICD-10-CM | POA: Diagnosis not present

## 2018-09-30 DIAGNOSIS — M549 Dorsalgia, unspecified: Secondary | ICD-10-CM | POA: Diagnosis not present

## 2018-09-30 DIAGNOSIS — R1013 Epigastric pain: Secondary | ICD-10-CM

## 2018-09-30 DIAGNOSIS — Z79899 Other long term (current) drug therapy: Secondary | ICD-10-CM | POA: Insufficient documentation

## 2018-09-30 DIAGNOSIS — R740 Nonspecific elevation of levels of transaminase and lactic acid dehydrogenase [LDH]: Secondary | ICD-10-CM

## 2018-09-30 DIAGNOSIS — R109 Unspecified abdominal pain: Secondary | ICD-10-CM

## 2018-09-30 DIAGNOSIS — R7401 Elevation of levels of liver transaminase levels: Secondary | ICD-10-CM

## 2018-09-30 DIAGNOSIS — K8020 Calculus of gallbladder without cholecystitis without obstruction: Secondary | ICD-10-CM | POA: Diagnosis not present

## 2018-09-30 DIAGNOSIS — Z8632 Personal history of gestational diabetes: Secondary | ICD-10-CM | POA: Insufficient documentation

## 2018-09-30 DIAGNOSIS — Z87891 Personal history of nicotine dependence: Secondary | ICD-10-CM | POA: Insufficient documentation

## 2018-09-30 LAB — HEPATIC FUNCTION PANEL
ALBUMIN: 4.5 g/dL (ref 3.5–5.0)
ALT: 252 U/L — ABNORMAL HIGH (ref 0–44)
AST: 342 U/L — ABNORMAL HIGH (ref 15–41)
Alkaline Phosphatase: 128 U/L — ABNORMAL HIGH (ref 38–126)
BILIRUBIN TOTAL: 0.5 mg/dL (ref 0.3–1.2)
Bilirubin, Direct: 0.1 mg/dL (ref 0.0–0.2)
Indirect Bilirubin: 0.4 mg/dL (ref 0.3–0.9)
TOTAL PROTEIN: 8 g/dL (ref 6.5–8.1)

## 2018-09-30 LAB — CBC
HCT: 40.2 % (ref 36.0–46.0)
HEMOGLOBIN: 13.1 g/dL (ref 12.0–15.0)
MCH: 27.3 pg (ref 26.0–34.0)
MCHC: 32.6 g/dL (ref 30.0–36.0)
MCV: 83.8 fL (ref 80.0–100.0)
Platelets: 367 10*3/uL (ref 150–400)
RBC: 4.8 MIL/uL (ref 3.87–5.11)
RDW: 14.5 % (ref 11.5–15.5)
WBC: 9.6 10*3/uL (ref 4.0–10.5)
nRBC: 0 % (ref 0.0–0.2)

## 2018-09-30 LAB — BASIC METABOLIC PANEL
Anion gap: 9 (ref 5–15)
BUN: 16 mg/dL (ref 6–20)
CO2: 21 mmol/L — ABNORMAL LOW (ref 22–32)
Calcium: 8.9 mg/dL (ref 8.9–10.3)
Chloride: 106 mmol/L (ref 98–111)
Creatinine, Ser: 0.57 mg/dL (ref 0.44–1.00)
GFR calc Af Amer: >60 mL/min (ref >60)
GFR calc non Af Amer: >60 mL/min (ref >60)
Glucose, Bld: 83 mg/dL (ref 70–99)
Potassium: 3.9 mmol/L (ref 3.5–5.1)
SODIUM: 136 mmol/L (ref 135–145)

## 2018-09-30 LAB — TROPONIN I

## 2018-09-30 LAB — LIPASE, BLOOD: Lipase: 47 U/L (ref 11–51)

## 2018-09-30 MED ORDER — FENTANYL CITRATE (PF) 100 MCG/2ML IJ SOLN
50.0000 ug | Freq: Once | INTRAMUSCULAR | Status: DC
  Filled 2018-09-30: qty 2

## 2018-09-30 MED ORDER — LIDOCAINE VISCOUS HCL 2 % MT SOLN: 15.0000 mL | Freq: Four times a day (QID) | OROMUCOSAL | 0 refills | Status: DC | PRN

## 2018-09-30 MED ORDER — SODIUM CHLORIDE 0.9 % IV BOLUS: 1000.0000 mL | Freq: Once | INTRAVENOUS | Status: DC

## 2018-09-30 MED ORDER — LIDOCAINE VISCOUS HCL 2 % MT SOLN
15.0000 mL | Freq: Once | OROMUCOSAL | Status: AC
  Administered 2018-09-30: 15 mL via ORAL
  Filled 2018-09-30: qty 15

## 2018-09-30 MED ORDER — ALUM & MAG HYDROXIDE-SIMETH 200-200-20 MG/5ML PO SUSP
30.0000 mL | Freq: Once | ORAL | Status: AC
  Administered 2018-09-30: 30 mL via ORAL
  Filled 2018-09-30: qty 30

## 2018-09-30 NOTE — ED Triage Notes (Signed)
PT c/o upper epigastric pain radiating into back. PT was seen yesterday and worked up, dx with indigestion, pt was told to come back if pain worsened for [place] of gallbladder. PT in NAD, VSS

## 2018-09-30 NOTE — Discharge Instructions (Signed)
Please seek medical attention for any high fevers, chest pain, shortness of breath, change in behavior, persistent vomiting, bloody stool or any other new or concerning symptoms.  

## 2018-09-30 NOTE — ED Provider Notes (Signed)
[facility] Emergency Department Provider Note   ____________________________________________   I have reviewed the triage vital signs and the nursing notes.   HISTORY  Chief Complaint Abdominal Pain   History limited by: Not Limited   HPI Sonya Spencer is a 33 y.o. female who presents to the emergency department today because of concern for continued epigastric pain. The pain started yesterday. She was seen in the emergency department and was treated for gerd. She was found to have mild elevation of a couple liver enzymes. She was told to return if the pain came back. She states it did come back today roughly 30-40 minutes after eating outmeal. It is located primarily in the epigastric region but radiates to both sides of her upper abdomen and back. Has been accompanied by some nausea. She has not had any fevers. Has not noticed any change to her stool.  Per medical record review patient has a history of visit to ER yesterday.   Past Medical History:  Diagnosis Date  . Allergy   . Anemia   . Anxiety   . Cholelithiasis   . Depression   . Fetal macrosomia during pregnancy in third trimester 03/25/2015   Resolved with delivery   . Frequent headaches   . GERD (gastroesophageal reflux disease)   . Gestational diabetes   . H/O maternal third degree perineal laceration, currently pregnant   . History of Papanicolaou smear of cervix 10/10/11; 08/02/14   neg, ct neg; neg ct//gc/tr neg  . Supervision of normal pregnancy 11/09/2017   Clinic [facility] Prenatal Labs Dating  Blood type:    Genetic Screen 1 Screen:    AFP:     Quad:     NIPS: Antibody:  Anatomic [place]  Rubella:   Varicella:   GTT Early:               Third trimester:  RPR:    Rhogam  HBsAg:    TDaP vaccine                       Flu Shot: HIV:    Baby Food                                GBS:  Contraception  Pap: CBB    CS/VBAC   Support Person        . Urinary incontinence     Patient Active Problem List    Diagnosis Date Noted  . Low-lying placenta 05/12/2018  . Fall (on) (from) other stairs and steps, initial encounter 03/05/2018  . Gestational diabetes mellitus (GDM) affecting third pregnancy 02/05/2018  . Abnormal glucose tolerance test (GTT) during pregnancy, antepartum 12/23/2017  . Supervision of high risk pregnancy, antepartum 11/16/2017  . History of macrosomia in infant in prior pregnancy, currently pregnant 11/09/2017  . Nausea and vomiting during pregnancy 11/09/2017  . History of cesarean delivery 11/09/2017  . Lipoma of axilla 06/22/2017  . Anxiety and depression 05/11/2017  . Frequent headaches 05/11/2017    Past Surgical History:  Procedure Laterality Date  . CESAREAN SECTION N/A 03/22/2015   Procedure: CESAREAN SECTION;  Surgeon: Will Bonnet, MD;  Location: [facility] ORS;  Service: Obstetrics;  Laterality: N/A;  . CESAREAN SECTION WITH BILATERAL TUBAL LIGATION N/A 06/24/2018   Procedure: CESAREAN SECTION WITH BILATERAL TUBAL LIGATION;  Surgeon: Will Bonnet, MD;  Location: [facility] ORS;  Service: Obstetrics;  Laterality:  N/A;    Prior to Admission medications   Medication Sig Start Date End Date Taking? Authorizing Provider  buPROPion (WELLBUTRIN XL) 300 MG 24 hr tablet Take 1 tablet (300 mg total) by mouth daily. 09/28/18   Will Bonnet, MD  cetirizine (ZYRTEC) 10 MG tablet Take 10 mg by mouth every evening.     [provider]  escitalopram (LEXAPRO) 20 MG tablet Take 1 tablet (20 mg total) by mouth daily. 09/09/18   Will Bonnet, MD  ibuprofen (ADVIL,MOTRIN) 600 MG tablet Take 1 tablet (600 mg total) by mouth every 6 (six) hours as needed. 06/27/18   Rexene Agent, CNM  Melatonin 5 MG CAPS Take 5-10 mg by mouth at bedtime as needed (sleep).     [provider]  Prenatal Vit-Fe Fumarate-FA (PRENATAL PO) Take 3 tablets by mouth every evening.    [provider]  ranitidine (ZANTAC) 300 MG tablet Take 1 tablet (300 mg total) by  mouth at bedtime. 09/29/18 09/29/19  Nena Polio, MD    Allergies Nickel and Red dye  Family History  Problem Relation Age of Onset  . Lupus Mother   . Hepatitis C Mother   . Depression Mother   . Diabetes type I Father   . Diabetes Father        type 1  . Thyroid disease Father   . Lupus Sister   . Lupus Maternal Grandmother   . Diabetes Maternal Grandmother   . Diabetes type I Daughter   . Diabetes Daughter   . Lupus Maternal Aunt   . Diabetes Paternal Grandfather        type 1  . Diabetes Paternal Aunt        type 1 - runs in father's side    Social History Social History   Tobacco Use  . Smoking status: Former Smoker    Packs/day: 0.25    Years: 2.00    Pack years: 0.50    Types: Cigarettes    Last attempt to quit: 03/13/2009    Years since quitting: 9.5  . Smokeless tobacco: Never Used  Substance Use Topics  . Alcohol use: Not Currently    Comment: not during pregnancy  . Drug use: No    Review of Systems Constitutional: No fever/chills Eyes: No visual changes. ENT: No sore throat. Cardiovascular: Denies chest pain. Respiratory: Denies shortness of breath. Gastrointestinal: Positive for epigastric pain.  Genitourinary: Negative for dysuria. Musculoskeletal: Negative for back pain. Skin: Negative for rash. Neurological: Negative for headaches, focal weakness or numbness.  ____________________________________________   PHYSICAL EXAM:  VITAL SIGNS: ED Triage Vitals  Enc Vitals Group     BP 09/30/18 1346 128/80     Pulse Rate 09/30/18 1346 78     Resp 09/30/18 1346 14     Temp 09/30/18 1346 97.6 F (36.4 C)     Temp Source 09/30/18 1346 Oral     SpO2 09/30/18 1346 97 %     Weight 09/30/18 1348 210 lb (95.3 kg)     Height 09/30/18 1348 5' 3"  (1.6 m)     Head Circumference --      Peak Flow --      Pain Score 09/30/18 1359 9    Constitutional: Alert and oriented.  Eyes: Conjunctivae are normal.  ENT      Head: Normocephalic and  atraumatic.      Nose: No congestion/rhinnorhea.      Mouth/Throat: Mucous membranes are moist.  Neck: No stridor. Hematological/Lymphatic/Immunilogical: No cervical lymphadenopathy. Cardiovascular: Normal rate, regular rhythm.  No murmurs, rubs, or gallops. Respiratory: Normal respiratory effort without tachypnea nor retractions. Breath sounds are clear and equal bilaterally. No wheezes/rales/rhonchi. Gastrointestinal: Soft. Tender somewhat diffusely throughout the abdomen. Worse in the epigastric and RUQ.  Genitourinary: Deferred Musculoskeletal: Normal range of motion in all extremities. No lower extremity edema. Neurologic:  Normal speech and language. No gross focal neurologic deficits are appreciated.  Skin:  Skin is warm, dry and intact. No rash noted. Psychiatric: Mood and affect are normal. Speech and behavior are normal. Patient exhibits appropriate insight and judgment.  ____________________________________________    LABS (pertinent positives/negatives)  LFTs ast 342, alt 252, alk phos 128 Lipase 47 CBC wbc 9.6, hgb 13.1, plt 367 BMP wnl except co2 21 Trop <0.03  ____________________________________________   EKG  None  ____________________________________________    RADIOLOGY  [place] 1.3 cm gallstone in the vicinity of the gallbladder neck  ____________________________________________   PROCEDURES  Procedures  ____________________________________________   INITIAL IMPRESSION / ASSESSMENT AND PLAN / ED COURSE  Pertinent labs & imaging results that were available during my care of the patient were reviewed by me and considered in my medical decision making (see chart for details).   Patient presented to the emergency department today because of concerns for continued epigastric pain and ultrasound of the right upper quadrant did show a gallstone.  Patient did have some transaminitis.  I discussed with Dr. Rosana Hoes surgeon on call.  Patient's pain was well  controlled by medication here.  No other signs or findings concerning for acute cholecystitis.  Patient will follow-up as outpatient.  Did discuss return precautions.  Also give patient information about dietary changes.   ____________________________________________   FINAL CLINICAL IMPRESSION(S) / ED DIAGNOSES  Final diagnoses:  Abdominal distress     Note: This dictation was prepared with Dragon dictation. Any transcriptional errors that result from this process are unintentional     Nance Pear, MD 09/30/18 1731

## 2018-10-05 ENCOUNTER — Other Ambulatory Visit: Payer: Self-pay

## 2018-10-05 ENCOUNTER — Ambulatory Visit (INDEPENDENT_AMBULATORY_CARE_PROVIDER_SITE_OTHER): Payer: Commercial Managed Care - PPO | Admitting: Surgery

## 2018-10-05 ENCOUNTER — Encounter: Payer: Self-pay | Admitting: Surgery

## 2018-10-05 VITALS — BP 138/86 | HR 101 | Temp 97.8°F | Resp 14 | Ht 63.0 in | Wt 202.0 lb

## 2018-10-05 DIAGNOSIS — K8020 Calculus of gallbladder without cholecystitis without obstruction: Secondary | ICD-10-CM | POA: Diagnosis not present

## 2018-10-05 MED ORDER — OMEPRAZOLE 20 MG PO CPDR: 20.0000 mg | DELAYED_RELEASE_CAPSULE | Freq: Every day | ORAL | 0 refills | Status: DC

## 2018-10-05 NOTE — Patient Instructions (Addendum)
The patient is scheduled for surgery at [facility] on 10/11/18 with Dr Rosana Hoes. She will pre admit by phone. The patient is aware of date and instructions.    Laparoscopic Cholecystectomy Laparoscopic cholecystectomy is surgery to remove the gallbladder. The gallbladder is a pear-shaped organ that lies beneath the liver on the right side of the body. The gallbladder stores bile, which is a fluid that helps the body to digest fats. Cholecystectomy is often done for inflammation of the gallbladder (cholecystitis). This condition is usually caused by a buildup of gallstones (cholelithiasis) in the gallbladder. Gallstones can block the flow of bile, which can result in inflammation and pain. In severe cases, emergency surgery may be required. This procedure is done though small incisions in your abdomen (laparoscopic surgery). A thin scope with a camera (laparoscope) is inserted through one incision. Thin surgical instruments are inserted through the other incisions. In some cases, a laparoscopic procedure may be turned into a type of surgery that is done through a larger incision (open surgery). Tell a health care provider about:  Any allergies you have.  All medicines you are taking, including vitamins, herbs, eye drops, creams, and over-the-counter medicines.  Any problems you or family members have had with anesthetic medicines.  Any blood disorders you have.  Any surgeries you have had.  Any medical conditions you have.  Whether you are pregnant or may be pregnant. What are the risks? Generally, this is a safe procedure. However, problems may occur, including:  Infection.  Bleeding.  Allergic reactions to medicines.  Damage to other structures or organs.  A stone remaining in the common bile duct. The common bile duct carries bile from the gallbladder into the small intestine.  A bile leak from the cyst duct that is clipped when your gallbladder is removed. What happens before the  procedure? Staying hydrated Follow instructions from your health care provider about hydration, which may include:  Up to 2 hours before the procedure - you may continue to drink clear liquids, such as water, clear fruit juice, black coffee, and plain tea. Eating and drinking restrictions Follow instructions from your health care provider about eating and drinking, which may include:  8 hours before the procedure - stop eating heavy meals or foods such as meat, fried foods, or fatty foods.  6 hours before the procedure - stop eating light meals or foods, such as toast or cereal.  6 hours before the procedure - stop drinking milk or drinks that contain milk.  2 hours before the procedure - stop drinking clear liquids. Medicines  Ask your health care provider about: ? Changing or stopping your regular medicines. This is especially important if you are taking diabetes medicines or blood thinners. ? Taking medicines such as aspirin and ibuprofen. These medicines can thin your blood. Do not take these medicines before your procedure if your health care provider instructs you not to.  You may be given antibiotic medicine to help prevent infection. General instructions  Let your health care provider know if you develop a cold or an infection before surgery.  Plan to have someone take you home from the hospital or clinic.  Ask your health care provider how your surgical site will be marked or identified. What happens during the procedure?   To reduce your risk of infection: ? Your health care team will wash or sanitize their hands. ? Your skin will be washed with soap. ? Hair may be removed from the surgical area.  An IV  tube may be inserted into one of your veins.  You will be given one or more of the following: ? A medicine to help you relax (sedative). ? A medicine to make you fall asleep (general anesthetic).  A breathing tube will be placed in your mouth.  Your surgeon will  make several small cuts (incisions) in your abdomen.  The laparoscope will be inserted through one of the small incisions. The camera on the laparoscope will send images to a TV screen (monitor) in the operating room. This lets your surgeon see inside your abdomen.  Air-like gas will be pumped into your abdomen. This will expand your abdomen to give the surgeon more room to perform the surgery.  Other tools that are needed for the procedure will be inserted through the other incisions. The gallbladder will be removed through one of the incisions.  Your common bile duct may be examined. If stones are found in the common bile duct, they may be removed.  After your gallbladder has been removed, the incisions will be closed with stitches (sutures), staples, or skin glue.  Your incisions may be covered with a bandage (dressing). The procedure may vary among health care providers and hospitals. What happens after the procedure?  Your blood pressure, heart rate, breathing rate, and blood oxygen level will be monitored until the medicines you were given have worn off.  You will be given medicines as needed to control your pain.  Do not drive for 24 hours if you were given a sedative. This information is not intended to replace advice given to you by your health care provider. Make sure you discuss any questions you have with your health care provider. Document Released: 09/15/2005 Document Revised: 08/13/2017 Document Reviewed: 03/03/2016 Elsevier Interactive Patient Education  2019 [employer].

## 2018-10-05 NOTE — Progress Notes (Signed)
Surgical Clinic History and Physical  Referring provider:  Mikey College, NP [place], [place] [address]  HISTORY OF PRESENT ILLNESS (HPI):  33 y.o. female presents for evaluation of recurrent post-prandial RUQ abdominal pain following fatty foods in particular. Patient reports she's experienced at least 3 prior similar episodes for which she's presented to ED, including most recently this past New Year's Day, and she was previously 6 years ago advised to undergo cholecystectomy, but this was deferred when she became pregnant. She also reports a history of GERD x 10+ years, particularly while pregnant, for which she takes Zantac prn. She denies any abdominal pain following spicy or acidic foods/drinks. She otherwise denies any fever/chills, N/V, CP, or SOB, underwent c-section for her 4th child this past September along with tubal ligation. She is breastfeeding.  PAST MEDICAL HISTORY (PMH):  Past Medical History:  Diagnosis Date  . Allergy   . Anemia   . Anxiety   . Cholelithiasis   . Depression   . Fetal macrosomia during pregnancy in third trimester 03/25/2015   Resolved with delivery   . Frequent headaches   . GERD (gastroesophageal reflux disease)   . Gestational diabetes   . H/O maternal third degree perineal laceration, currently pregnant   . History of Papanicolaou smear of cervix 10/10/11; 08/02/14   neg, ct neg; neg ct//gc/tr neg  . Supervision of normal pregnancy 11/09/2017   [facility] Prenatal Labs Dating  Blood type:    Genetic Screen 1 Screen:    AFP:     Quad:     NIPS: Antibody:  Anatomic [place]  Rubella:   Varicella:   GTT Early:               Third trimester:  RPR:    Rhogam  HBsAg:    TDaP vaccine                       Flu Shot: HIV:    Baby Food                                GBS:  Contraception  Pap: CBB    CS/VBAC   Support Person        . Urinary incontinence     PAST SURGICAL HISTORY ([place]):  Past Surgical History:  Procedure Laterality Date  .  CESAREAN SECTION N/A 03/22/2015   Procedure: CESAREAN SECTION;  Surgeon: Will Bonnet, MD;  Location: [facility] ORS;  Service: Obstetrics;  Laterality: N/A;  . CESAREAN SECTION WITH BILATERAL TUBAL LIGATION N/A 06/24/2018   Procedure: CESAREAN SECTION WITH BILATERAL TUBAL LIGATION;  Surgeon: Will Bonnet, MD;  Location: [facility] ORS;  Service: Obstetrics;  Laterality: N/A;    MEDICATIONS:  Prior to Admission medications   Medication Sig Start Date End Date Taking? Authorizing Provider  buPROPion (WELLBUTRIN XL) 300 MG 24 hr tablet Take 1 tablet (300 mg total) by mouth daily. 09/28/18  Yes Will Bonnet, MD  cetirizine (ZYRTEC) 10 MG tablet Take 10 mg by mouth every evening.    Yes [provider]  escitalopram (LEXAPRO) 20 MG tablet Take 1 tablet (20 mg total) by mouth daily. 09/09/18  Yes Will Bonnet, MD  ibuprofen (ADVIL,MOTRIN) 600 MG tablet Take 1 tablet (600 mg total) by mouth every 6 (six) hours as needed. 06/27/18  Yes Rexene Agent, CNM  lidocaine (XYLOCAINE) 2 % solution Use  as directed 15 mLs in the mouth or throat every 6 (six) hours as needed (abdominal pain). 09/30/18  Yes Nance Pear, MD  Melatonin 5 MG CAPS Take 5-10 mg by mouth at bedtime as needed (sleep).    Yes [provider]  Prenatal Vit-Fe Fumarate-FA (PRENATAL PO) Take 3 tablets by mouth every evening.   Yes [provider]  ranitidine (ZANTAC) 300 MG tablet Take 1 tablet (300 mg total) by mouth at bedtime. 09/29/18 09/29/19 Yes Nena Polio, MD  omeprazole (PRILOSEC) 20 MG capsule Take 1 capsule (20 mg total) by mouth daily. 10/05/18   Vickie Epley, MD    ALLERGIES:  Allergies  Allergen Reactions  . Nickel Rash  . Red Dye Rash    Includes Benadryl as it has red dye in it.    SOCIAL HISTORY:  Social History   Socioeconomic History  . Marital status: Married    Spouse name: Not on file  . Number of children: 3  . Years of education: 24  . Highest education level:  Not on file  Occupational History  . Occupation: HOMEMAKER  Social Needs  . Financial resource strain: Not on file  . Food insecurity:    Worry: Not on file    Inability: Not on file  . Transportation needs:    Medical: Not on file    Non-medical: Not on file  Tobacco Use  . Smoking status: Former Smoker    Packs/day: 0.25    Years: 2.00    Pack years: 0.50    Types: Cigarettes    Last attempt to quit: 03/13/2009    Years since quitting: 9.5  . Smokeless tobacco: Never Used  Substance and Sexual Activity  . Alcohol use: Not Currently    Comment: not during pregnancy  . Drug use: No  . Sexual activity: Yes  Lifestyle  . Physical activity:    Days per week: Not on file    Minutes per session: Not on file  . Stress: Not on file  Relationships  . Social connections:    Talks on phone: Not on file    Gets together: Not on file    Attends religious service: Not on file    Active member of club or organization: Not on file    Attends meetings of clubs or organizations: Not on file    Relationship status: Not on file  . Intimate partner violence:    Fear of current or ex partner: Not on file    Emotionally abused: Not on file    Physically abused: Not on file    Forced sexual activity: Not on file  Other Topics Concern  . Not on file  Social History Narrative  . Not on file    The patient currently resides (home / rehab facility / nursing home): Home The patient normally is (ambulatory / bedbound): Ambulatory  FAMILY HISTORY:  Family History  Problem Relation Age of Onset  . Lupus Mother   . Hepatitis C Mother   . Depression Mother   . Diabetes type I Father   . Diabetes Father        type 1  . Thyroid disease Father   . Lupus Sister   . Lupus Maternal Grandmother   . Diabetes Maternal Grandmother   . Diabetes type I Daughter   . Diabetes Daughter   . Lupus Maternal Aunt   . Diabetes Paternal Grandfather        type 1  .  Diabetes Paternal Aunt        type  1 - runs in father's side    Otherwise negative/non-contributory.  REVIEW OF SYSTEMS:  Constitutional: denies any other weight loss, fever, chills, or sweats  Eyes: denies any other vision changes, history of eye injury  ENT: denies sore throat, hearing problems  Respiratory: denies shortness of breath, wheezing  Cardiovascular: denies chest pain, palpitations  Gastrointestinal: abdominal pain, N/V, and bowel function as per HPI Musculoskeletal: denies any other joint pains or cramps  Skin: Denies any other rashes or skin discolorations except as per HPI Neurological: denies any other headache, dizziness, weakness  Psychiatric: Denies any other depression, anxiety   All other review of systems were otherwise negative   VITAL SIGNS:  BP 138/86   Pulse (!) 101   Temp 97.8 F (36.6 C) (Skin)   Resp 14   Ht 5\' 3"  (1.6 m)   Wt 202 lb (91.6 kg)   SpO2 98%   BMI 35.78 kg/m   PHYSICAL EXAM:  Constitutional:  -- Obese body habitus (though also post-partum) -- Awake, alert, and oriented x3  Eyes:  -- Pupils equally round and reactive to light  -- No scleral icterus  Ear, nose, throat:  -- No jugular venous distension -- No nasal drainage, bleeding Pulmonary:  -- No crackles  -- Equal breath sounds bilaterally -- Breathing non-labored at rest Cardiovascular:  -- S1, S2 present  -- No pericardial rubs  Gastrointestinal:  -- Abdomen soft, nontender, non-distended, no guarding/rebound  -- No abdominal masses appreciated, pulsatile or otherwise  Musculoskeletal and Integumentary:  -- Wounds or skin discoloration: None appreciated -- Extremities: B/L UE and LE FROM, hands and feet warm Neurologic:  -- Motor function: Intact and symmetric -- Sensation: Intact and symmetric  Labs:  CBC Latest Ref Rng & Units 09/30/2018 09/29/2018 06/25/2018  WBC 4.0 - 10.5 K/uL 9.6 11.9(H) 12.9(H)  Hemoglobin 12.0 - 15.0 g/dL 13.1 13.0 10.2(L)  Hematocrit 36.0 - 46.0 % 40.2 39.8 29.7(L)   Platelets 150 - 400 K/uL 367 369 185   CMP Latest Ref Rng & Units 09/30/2018 09/29/2018 10/08/2014  Glucose 70 - 99 mg/dL 83 93 70  BUN 6 - 20 mg/dL 16 14 5(L)  Creatinine 0.44 - 1.00 mg/dL 0.57 0.60 0.42(L)  Sodium 135 - 145 mmol/L 136 135 138  Potassium 3.5 - 5.1 mmol/L 3.9 4.0 3.6  Chloride 98 - 111 mmol/L 106 105 106  CO2 22 - 32 mmol/L 21(L) 21(L) 23  Calcium 8.9 - 10.3 mg/dL 8.9 9.1 8.6  Total Protein 6.5 - 8.1 g/dL 8.0 7.7 7.4  Total Bilirubin 0.3 - 1.2 mg/dL 0.5 0.4 0.2  Alkaline Phos 38 - 126 U/L 128(H) 101 80  AST 15 - 41 U/L 342(H) 264(H) 51(H)  ALT 0 - 44 U/L 252(H) 135(H) 37   Imaging studies:  Limited RUQ Abdominal Ultrasound (09/29/2018) 1. Cholelithiasis with 1.3 cm gallstone in the vicinity of the gallbladder neck/cystic duct. No sonographic evidence of acute cholecystitis. 2. Hepatic steatosis.   Assessment/Plan: 33 y.o. female with symptomatic cholelithiasis, complicated by pertinent comorbidities including morbid obesity (BMI 36), gestational DM, GERD, frequent headaches, generalized anxiety disorder, and major depression disorder.               - avoid/minimize foods with higher fat content (meats, cheeses/dairy, and fried)             - prefer low-fat vegetables, whole grains (wheat bread, ceareals, etc), and fruits until cholecystectomy              -  all risks, benefits, and alternatives to cholecystectomy were discussed with the patient, all of her questions were answered to patient's expressed satisfaction, patient expresses she wishes to proceed, and informed consent was obtained.             - will plan for laparoscopic cholecystectomy next week per patient request pending anesthesia and OR availability             - anticipate return to clinic 2 weeks after above planned surgery             - instructed to call if any questions or concerns  All of the above recommendations were discussed with the patient and patient's mom, and all of patient's and family's  questions were answered to their expressed satisfaction.  Thank you for the opportunity to participate in this patient's care.  -- Marilynne Drivers Rosana Hoes, MD, [facility]: [facility] - Partnering for exceptional care. Office: [phone]

## 2018-10-05 NOTE — H&P (View-Only) (Signed)
Surgical Clinic History and Physical  Referring provider:  Mikey College, NP [place], [place] [address]  HISTORY OF PRESENT ILLNESS (HPI):  33 y.o. female presents for evaluation of recurrent post-prandial RUQ abdominal pain following fatty foods in particular. Patient reports she's experienced at least 3 prior similar episodes for which she's presented to ED, including most recently this past New Year's Day, and she was previously 6 years ago advised to undergo cholecystectomy, but this was deferred when she became pregnant. She also reports a history of GERD x 10+ years, particularly while pregnant, for which she takes Zantac prn. She denies any abdominal pain following spicy or acidic foods/drinks. She otherwise denies any fever/chills, N/V, CP, or SOB, underwent c-section for her 4th child this past September along with tubal ligation. She is breastfeeding.  PAST MEDICAL HISTORY (PMH):  Past Medical History:  Diagnosis Date  . Allergy   . Anemia   . Anxiety   . Cholelithiasis   . Depression   . Fetal macrosomia during pregnancy in third trimester 03/25/2015   Resolved with delivery   . Frequent headaches   . GERD (gastroesophageal reflux disease)   . Gestational diabetes   . H/O maternal third degree perineal laceration, currently pregnant   . History of Papanicolaou smear of cervix 10/10/11; 08/02/14   neg, ct neg; neg ct//gc/tr neg  . Supervision of normal pregnancy 11/09/2017   [facility]inic [facility] Prenatal Labs Dating  Blood type:    Genetic Screen 1 Screen:    AFP:     Quad:     NIPS: Antibody:  Anatomic [place]  Rubella:   Varicella:   GTT Early:               Third trimester:  RPR:    Rhogam  HBsAg:    TDaP vaccine                       Flu Shot: HIV:    Baby Food                                GBS:  Contraception  Pap: CBB    CS/VBAC   Support Person        . Urinary incontinence     PAST SURGICAL HISTORY ([place]):  Past Surgical History:  Procedure Laterality Date  .  CESAREAN SECTION N/A 03/22/2015   Procedure: CESAREAN SECTION;  Surgeon: Will Bonnet, MD;  Location: [facility] ORS;  Service: Obstetrics;  Laterality: N/A;  . CESAREAN SECTION WITH BILATERAL TUBAL LIGATION N/A 06/24/2018   Procedure: CESAREAN SECTION WITH BILATERAL TUBAL LIGATION;  Surgeon: Will Bonnet, MD;  Location: [facility] ORS;  Service: Obstetrics;  Laterality: N/A;    MEDICATIONS:  Prior to Admission medications   Medication Sig Start Date End Date Taking? Authorizing Provider  buPROPion (WELLBUTRIN XL) 300 MG 24 hr tablet Take 1 tablet (300 mg total) by mouth daily. 09/28/18  Yes Will Bonnet, MD  cetirizine (ZYRTEC) 10 MG tablet Take 10 mg by mouth every evening.    Yes [provider]  escitalopram (LEXAPRO) 20 MG tablet Take 1 tablet (20 mg total) by mouth daily. 09/09/18  Yes Will Bonnet, MD  ibuprofen (ADVIL,MOTRIN) 600 MG tablet Take 1 tablet (600 mg total) by mouth every 6 (six) hours as needed. 06/27/18  Yes Rexene Agent, CNM  lidocaine (XYLOCAINE) 2 % solution Use  as directed 15 mLs in the mouth or throat every 6 (six) hours as needed (abdominal pain). 09/30/18  Yes Nance Pear, MD  Melatonin 5 MG CAPS Take 5-10 mg by mouth at bedtime as needed (sleep).    Yes [provider]  Prenatal Vit-Fe Fumarate-FA (PRENATAL PO) Take 3 tablets by mouth every evening.   Yes [provider]  ranitidine (ZANTAC) 300 MG tablet Take 1 tablet (300 mg total) by mouth at bedtime. 09/29/18 09/29/19 Yes Nena Polio, MD  omeprazole (PRILOSEC) 20 MG capsule Take 1 capsule (20 mg total) by mouth daily. 10/05/18   Vickie Epley, MD    ALLERGIES:  Allergies  Allergen Reactions  . Nickel Rash  . Red Dye Rash    Includes Benadryl as it has red dye in it.    SOCIAL HISTORY:  Social History   Socioeconomic History  . Marital status: Married    Spouse name: Not on file  . Number of children: 3  . Years of education: 55  . Highest education level:  Not on file  Occupational History  . Occupation: HOMEMAKER  Social Needs  . Financial resource strain: Not on file  . Food insecurity:    Worry: Not on file    Inability: Not on file  . Transportation needs:    Medical: Not on file    Non-medical: Not on file  Tobacco Use  . Smoking status: Former Smoker    Packs/day: 0.25    Years: 2.00    Pack years: 0.50    Types: Cigarettes    Last attempt to quit: 03/13/2009    Years since quitting: 9.5  . Smokeless tobacco: Never Used  Substance and Sexual Activity  . Alcohol use: Not Currently    Comment: not during pregnancy  . Drug use: No  . Sexual activity: Yes  Lifestyle  . Physical activity:    Days per week: Not on file    Minutes per session: Not on file  . Stress: Not on file  Relationships  . Social connections:    Talks on phone: Not on file    Gets together: Not on file    Attends religious service: Not on file    Active member of club or organization: Not on file    Attends meetings of clubs or organizations: Not on file    Relationship status: Not on file  . Intimate partner violence:    Fear of current or ex partner: Not on file    Emotionally abused: Not on file    Physically abused: Not on file    Forced sexual activity: Not on file  Other Topics Concern  . Not on file  Social History Narrative  . Not on file    The patient currently resides (home / rehab facility / nursing home): Home The patient normally is (ambulatory / bedbound): Ambulatory  FAMILY HISTORY:  Family History  Problem Relation Age of Onset  . Lupus Mother   . Hepatitis C Mother   . Depression Mother   . Diabetes type I Father   . Diabetes Father        type 1  . Thyroid disease Father   . Lupus Sister   . Lupus Maternal Grandmother   . Diabetes Maternal Grandmother   . Diabetes type I Daughter   . Diabetes Daughter   . Lupus Maternal Aunt   . Diabetes Paternal Grandfather        type 1  .  Diabetes Paternal Aunt        type  1 - runs in father's side    Otherwise negative/non-contributory.  REVIEW OF SYSTEMS:  Constitutional: denies any other weight loss, fever, chills, or sweats  Eyes: denies any other vision changes, history of eye injury  ENT: denies sore throat, hearing problems  Respiratory: denies shortness of breath, wheezing  Cardiovascular: denies chest pain, palpitations  Gastrointestinal: abdominal pain, N/V, and bowel function as per HPI Musculoskeletal: denies any other joint pains or cramps  Skin: Denies any other rashes or skin discolorations except as per HPI Neurological: denies any other headache, dizziness, weakness  Psychiatric: Denies any other depression, anxiety   All other review of systems were otherwise negative   VITAL SIGNS:  BP 138/86   Pulse (!) 101   Temp 97.8 F (36.6 C) (Skin)   Resp 14   Ht 5\' 3"  (1.6 m)   Wt 202 lb (91.6 kg)   SpO2 98%   BMI 35.78 kg/m   PHYSICAL EXAM:  Constitutional:  -- Obese body habitus (though also post-partum) -- Awake, alert, and oriented x3  Eyes:  -- Pupils equally round and reactive to light  -- No scleral icterus  Ear, nose, throat:  -- No jugular venous distension -- No nasal drainage, bleeding Pulmonary:  -- No crackles  -- Equal breath sounds bilaterally -- Breathing non-labored at rest Cardiovascular:  -- S1, S2 present  -- No pericardial rubs  Gastrointestinal:  -- Abdomen soft, nontender, non-distended, no guarding/rebound  -- No abdominal masses appreciated, pulsatile or otherwise  Musculoskeletal and Integumentary:  -- Wounds or skin discoloration: None appreciated -- Extremities: B/L UE and LE FROM, hands and feet warm Neurologic:  -- Motor function: Intact and symmetric -- Sensation: Intact and symmetric  Labs:  CBC Latest Ref Rng & Units 09/30/2018 09/29/2018 06/25/2018  WBC 4.0 - 10.5 K/uL 9.6 11.9(H) 12.9(H)  Hemoglobin 12.0 - 15.0 g/dL 13.1 13.0 10.2(L)  Hematocrit 36.0 - 46.0 % 40.2 39.8 29.7(L)   Platelets 150 - 400 K/uL 367 369 185   CMP Latest Ref Rng & Units 09/30/2018 09/29/2018 10/08/2014  Glucose 70 - 99 mg/dL 83 93 70  BUN 6 - 20 mg/dL 16 14 5(L)  Creatinine 0.44 - 1.00 mg/dL 0.57 0.60 0.42(L)  Sodium 135 - 145 mmol/L 136 135 138  Potassium 3.5 - 5.1 mmol/L 3.9 4.0 3.6  Chloride 98 - 111 mmol/L 106 105 106  CO2 22 - 32 mmol/L 21(L) 21(L) 23  Calcium 8.9 - 10.3 mg/dL 8.9 9.1 8.6  Total Protein 6.5 - 8.1 g/dL 8.0 7.7 7.4  Total Bilirubin 0.3 - 1.2 mg/dL 0.5 0.4 0.2  Alkaline Phos 38 - 126 U/L 128(H) 101 80  AST 15 - 41 U/L 342(H) 264(H) 51(H)  ALT 0 - 44 U/L 252(H) 135(H) 37   Imaging studies:  Limited RUQ Abdominal Ultrasound (09/29/2018) 1. Cholelithiasis with 1.3 cm gallstone in the vicinity of the gallbladder neck/cystic duct. No sonographic evidence of acute cholecystitis. 2. Hepatic steatosis.   Assessment/Plan: 33 y.o. female with symptomatic cholelithiasis, complicated by pertinent comorbidities including morbid obesity (BMI 36), gestational DM, GERD, frequent headaches, generalized anxiety disorder, and major depression disorder.               - avoid/minimize foods with higher fat content (meats, cheeses/dairy, and fried)             - prefer low-fat vegetables, whole grains (wheat bread, ceareals, etc), and fruits until cholecystectomy              -  all risks, benefits, and alternatives to cholecystectomy were discussed with the patient, all of her questions were answered to patient's expressed satisfaction, patient expresses she wishes to proceed, and informed consent was obtained.             - will plan for laparoscopic cholecystectomy next week per patient request pending anesthesia and OR availability             - anticipate return to clinic 2 weeks after above planned surgery             - instructed to call if any questions or concerns  All of the above recommendations were discussed with the patient and patient's mom, and all of patient's and family's  questions were answered to their expressed satisfaction.  Thank you for the opportunity to participate in this patient's care.  -- Marilynne Drivers Rosana Hoes, MD, [place]: [facility] - Partnering for exceptional care. Office: [phone]

## 2018-10-06 ENCOUNTER — Encounter: Payer: Self-pay | Admitting: Surgery

## 2018-10-06 ENCOUNTER — Other Ambulatory Visit: Payer: Self-pay

## 2018-10-06 ENCOUNTER — Encounter
Admission: RE | Admit: 2018-10-06 | Discharge: 2018-10-06 | Disposition: A | Discharge status: Home / Self Care | Payer: Commercial Managed Care - PPO | Source: Ambulatory Visit | Attending: Surgery | Admitting: Surgery

## 2018-10-06 DIAGNOSIS — K8020 Calculus of gallbladder without cholecystitis without obstruction: Secondary | ICD-10-CM | POA: Insufficient documentation

## 2018-10-06 HISTORY — DX: Unspecified asthma, uncomplicated: J45.909

## 2018-10-06 HISTORY — DX: Adverse effect of unspecified anesthetic, initial encounter: T41.45XA

## 2018-10-06 HISTORY — DX: Other specified postprocedural states: R11.2

## 2018-10-06 HISTORY — DX: Nausea with vomiting, unspecified: R11.2

## 2018-10-06 HISTORY — DX: Other complications of anesthesia, initial encounter: T88.59XA

## 2018-10-06 HISTORY — DX: Other specified postprocedural states: Z98.890

## 2018-10-06 NOTE — Patient Instructions (Signed)
Your procedure is scheduled on: 10-11-18 Report to Same Day Surgery 2nd floor medical mall [facility] Entrance-take elevator on left to 2nd floor.  Check in with surgery information desk.) To find out your arrival time please call [phone] between 1PM - 3PM on 10-08-18  Remember: Instructions that are not followed completely may result in serious medical risk, up to and including death, or upon the discretion of your surgeon and anesthesiologist your surgery may need to be rescheduled.    _x___ 1. Do not eat food after midnight the night before your procedure. You may drink clear liquids up to 2 hours before you are scheduled to arrive at the hospital for your procedure.  Do not drink clear liquids within 2 hours of your scheduled arrival to the hospital.  Clear liquids include  --Water or Apple juice without pulp  --Clear carbohydrate beverage such as ClearFast or Gatorade  --Black Coffee or Clear Tea (No milk, no creamers, do not add anything to the coffee or Tea   ____Ensure clear carbohydrate drink on the way to the hospital for bariatric patients  ____Ensure clear carbohydrate drink 3 hours before surgery for Dr Dwyane Luo patients if physician instructed.   No gum chewing or hard candies.     __x__ 2. No Alcohol for 24 hours before or after surgery.   __x__3. No Smoking or e-cigarettes for 24 prior to surgery.  Do not use any chewable tobacco products for at least 6 hour prior to surgery   ____  4. Bring all medications with you on the day of surgery if instructed.    __x__ 5. Notify your doctor if there is any change in your medical condition     (cold, fever, infections).    x___6. On the morning of surgery brush your teeth with toothpaste and water.  You may rinse your mouth with mouth wash if you wish.  Do not swallow any toothpaste or mouthwash.   Do not wear jewelry, make-up, hairpins, clips or nail polish.  Do not wear lotions, powders, or perfumes. You may wear  deodorant.  Do not shave 48 hours prior to surgery. Men may shave face and neck.  Do not bring valuables to the hospital.    [facility] is not responsible for any belongings or valuables.               Contacts, dentures or bridgework may not be worn into surgery.  Leave your suitcase in the car. After surgery it may be brought to your room.  For patients admitted to the hospital, discharge time is determined by your treatment team.  _  Patients discharged the day of surgery will not be allowed to drive home.  You will need someone to drive you home and stay with you the night of your procedure.    Please read over the following fact sheets that you were given:   [facility] Preparing for Surgery  _x___ TAKE THE FOLLOWING MEDICATION THE MORNING OF SURGERY WITH A SMALL SIP OF WATER. These include:  1. PRILOSEC  2. TAKE AN EXTRA PRILOSEC THE NIGHT BEFORE YOUR SURGERY  3.  4.  5.  6.  ____Fleets enema or Magnesium Citrate as directed.   ____ Use CHG Soap or sage wipes as directed on instruction sheet   ____ Use inhalers on the day of surgery and bring to hospital day of surgery  ____ Stop Metformin and Janumet 2 days prior to surgery.    ____ Take 1/2  of usual insulin dose the night before surgery and none on the morning surgery.   ____ Follow recommendations from Cardiologist, Pulmonologist or PCP regarding stopping Aspirin, Coumadin, Plavix ,Eliquis, Effient, or Pradaxa, and Pletal.  X____Stop Anti-inflammatories such as Advil, Aleve, Ibuprofen, Motrin, Naproxen, Naprosyn, Goodies powders or aspirin products NOW-OK to take Tylenol    ____ Stop supplements until after surgery.   ____ Bring C-Pap to the hospital.

## 2018-10-10 MED ORDER — CEFAZOLIN SODIUM-DEXTROSE 2-4 GM/100ML-% IV SOLN
2.0000 g | INTRAVENOUS | Status: AC
  Administered 2018-10-11: 2 g via INTRAVENOUS

## 2018-10-11 ENCOUNTER — Other Ambulatory Visit: Payer: Self-pay

## 2018-10-11 ENCOUNTER — Ambulatory Visit
Admission: RE | Admit: 2018-10-11 | Discharge: 2018-10-11 | Disposition: A | Discharge status: Home / Self Care | Payer: Commercial Managed Care - PPO | Attending: Surgery | Admitting: Surgery

## 2018-10-11 ENCOUNTER — Ambulatory Visit: Payer: Commercial Managed Care - PPO | Admitting: Anesthesiology

## 2018-10-11 ENCOUNTER — Encounter: Payer: Self-pay | Admitting: *Deleted

## 2018-10-11 ENCOUNTER — Encounter
Admission: RE | Disposition: A | Discharge status: Home / Self Care | Payer: Self-pay | Source: Home / Self Care | Attending: Surgery

## 2018-10-11 DIAGNOSIS — K8012 Calculus of gallbladder with acute and chronic cholecystitis without obstruction: Secondary | ICD-10-CM | POA: Diagnosis not present

## 2018-10-11 DIAGNOSIS — Z8632 Personal history of gestational diabetes: Secondary | ICD-10-CM | POA: Diagnosis not present

## 2018-10-11 DIAGNOSIS — F329 Major depressive disorder, single episode, unspecified: Secondary | ICD-10-CM | POA: Diagnosis not present

## 2018-10-11 DIAGNOSIS — J45909 Unspecified asthma, uncomplicated: Secondary | ICD-10-CM | POA: Diagnosis not present

## 2018-10-11 DIAGNOSIS — Z6836 Body mass index (BMI) 36.0-36.9, adult: Secondary | ICD-10-CM | POA: Diagnosis not present

## 2018-10-11 DIAGNOSIS — K828 Other specified diseases of gallbladder: Secondary | ICD-10-CM | POA: Diagnosis not present

## 2018-10-11 DIAGNOSIS — K8010 Calculus of gallbladder with chronic cholecystitis without obstruction: Secondary | ICD-10-CM | POA: Diagnosis present

## 2018-10-11 DIAGNOSIS — R51 Headache: Secondary | ICD-10-CM | POA: Diagnosis not present

## 2018-10-11 DIAGNOSIS — K8020 Calculus of gallbladder without cholecystitis without obstruction: Secondary | ICD-10-CM

## 2018-10-11 DIAGNOSIS — Z9102 Food additives allergy status: Secondary | ICD-10-CM | POA: Diagnosis not present

## 2018-10-11 DIAGNOSIS — Z791 Long term (current) use of non-steroidal anti-inflammatories (NSAID): Secondary | ICD-10-CM | POA: Diagnosis not present

## 2018-10-11 DIAGNOSIS — Z87891 Personal history of nicotine dependence: Secondary | ICD-10-CM | POA: Insufficient documentation

## 2018-10-11 DIAGNOSIS — Z79899 Other long term (current) drug therapy: Secondary | ICD-10-CM | POA: Diagnosis not present

## 2018-10-11 DIAGNOSIS — R1013 Epigastric pain: Secondary | ICD-10-CM | POA: Insufficient documentation

## 2018-10-11 DIAGNOSIS — Z91048 Other nonmedicinal substance allergy status: Secondary | ICD-10-CM | POA: Diagnosis not present

## 2018-10-11 DIAGNOSIS — K219 Gastro-esophageal reflux disease without esophagitis: Secondary | ICD-10-CM | POA: Insufficient documentation

## 2018-10-11 DIAGNOSIS — F411 Generalized anxiety disorder: Secondary | ICD-10-CM | POA: Diagnosis not present

## 2018-10-11 DIAGNOSIS — D649 Anemia, unspecified: Secondary | ICD-10-CM | POA: Diagnosis not present

## 2018-10-11 DIAGNOSIS — K811 Chronic cholecystitis: Secondary | ICD-10-CM

## 2018-10-11 DIAGNOSIS — F410 Panic disorder [episodic paroxysmal anxiety] without agoraphobia: Secondary | ICD-10-CM | POA: Insufficient documentation

## 2018-10-11 DIAGNOSIS — Z888 Allergy status to other drugs, medicaments and biological substances status: Secondary | ICD-10-CM | POA: Diagnosis not present

## 2018-10-11 HISTORY — PX: CHOLECYSTECTOMY: SHX55

## 2018-10-11 LAB — POCT PREGNANCY, URINE: Preg Test, Ur: NEGATIVE

## 2018-10-11 SURGERY — LAPAROSCOPIC CHOLECYSTECTOMY
Anesthesia: General

## 2018-10-11 MED ORDER — OXYCODONE-ACETAMINOPHEN 5-325 MG PO TABS
ORAL_TABLET | ORAL | Status: AC
  Filled 2018-10-11: qty 1

## 2018-10-11 MED ORDER — PROPOFOL 10 MG/ML IV BOLUS
INTRAVENOUS | Status: DC | PRN
  Administered 2018-10-11: 150 mg via INTRAVENOUS

## 2018-10-11 MED ORDER — DEXAMETHASONE SODIUM PHOSPHATE 10 MG/ML IJ SOLN
INTRAMUSCULAR | Status: DC | PRN
  Administered 2018-10-11: 10 mg via INTRAVENOUS

## 2018-10-11 MED ORDER — DEXMEDETOMIDINE HCL IN NACL 80 MCG/20ML IV SOLN
INTRAVENOUS | Status: AC
  Filled 2018-10-11: qty 20

## 2018-10-11 MED ORDER — FENTANYL CITRATE (PF) 100 MCG/2ML IJ SOLN
25.0000 ug | INTRAMUSCULAR | Status: DC | PRN
  Administered 2018-10-11 (×2): 50 ug via INTRAVENOUS

## 2018-10-11 MED ORDER — ROCURONIUM BROMIDE 100 MG/10ML IV SOLN
INTRAVENOUS | Status: DC | PRN
  Administered 2018-10-11: 20 mg via INTRAVENOUS
  Administered 2018-10-11: 40 mg via INTRAVENOUS
  Administered 2018-10-11: 10 mg via INTRAVENOUS

## 2018-10-11 MED ORDER — LIDOCAINE HCL (PF) 1 % IJ SOLN
INTRAMUSCULAR | Status: AC
  Filled 2018-10-11: qty 30

## 2018-10-11 MED ORDER — FENTANYL CITRATE (PF) 100 MCG/2ML IJ SOLN
INTRAMUSCULAR | Status: DC | PRN
  Administered 2018-10-11 (×4): 50 ug via INTRAVENOUS

## 2018-10-11 MED ORDER — OXYCODONE-ACETAMINOPHEN 5-325 MG PO TABS: 1.0000 | ORAL_TABLET | ORAL | 0 refills | Status: DC | PRN

## 2018-10-11 MED ORDER — LACTATED RINGERS IV SOLN
INTRAVENOUS | Status: DC
  Administered 2018-10-11 (×2): via INTRAVENOUS

## 2018-10-11 MED ORDER — ONDANSETRON HCL 4 MG/2ML IJ SOLN
INTRAMUSCULAR | Status: DC | PRN
  Administered 2018-10-11: 4 mg via INTRAVENOUS

## 2018-10-11 MED ORDER — CHLORHEXIDINE GLUCONATE CLOTH 2 % EX PADS: 6.0000 | MEDICATED_PAD | Freq: Once | CUTANEOUS | Status: DC

## 2018-10-11 MED ORDER — LIDOCAINE HCL (CARDIAC) PF 100 MG/5ML IV SOSY
PREFILLED_SYRINGE | INTRAVENOUS | Status: DC | PRN
  Administered 2018-10-11: 100 mg via INTRAVENOUS

## 2018-10-11 MED ORDER — IBUPROFEN 600 MG PO TABS
ORAL_TABLET | ORAL | Status: AC
  Filled 2018-10-11: qty 1

## 2018-10-11 MED ORDER — FENTANYL CITRATE (PF) 100 MCG/2ML IJ SOLN
INTRAMUSCULAR | Status: AC
  Filled 2018-10-11: qty 2

## 2018-10-11 MED ORDER — MEPERIDINE HCL 50 MG/ML IJ SOLN: 6.2500 mg | INTRAMUSCULAR | Status: DC | PRN

## 2018-10-11 MED ORDER — BUPIVACAINE HCL (PF) 0.5 % IJ SOLN
INTRAMUSCULAR | Status: AC
  Filled 2018-10-11: qty 30

## 2018-10-11 MED ORDER — PROMETHAZINE HCL 25 MG/ML IJ SOLN: 6.2500 mg | INTRAMUSCULAR | Status: DC | PRN

## 2018-10-11 MED ORDER — MIDAZOLAM HCL 2 MG/2ML IJ SOLN
INTRAMUSCULAR | Status: DC | PRN
  Administered 2018-10-11: 2 mg via INTRAVENOUS

## 2018-10-11 MED ORDER — ACETAMINOPHEN 500 MG PO TABS
ORAL_TABLET | ORAL | Status: AC
  Administered 2018-10-11: 1000 mg via ORAL
  Filled 2018-10-11: qty 2

## 2018-10-11 MED ORDER — IBUPROFEN 200 MG PO TABS: 600.0000 mg | ORAL_TABLET | Freq: Four times a day (QID) | ORAL | 2 refills | Status: AC | PRN

## 2018-10-11 MED ORDER — SUGAMMADEX SODIUM 200 MG/2ML IV SOLN
INTRAVENOUS | Status: DC | PRN
  Administered 2018-10-11: 183.2 mg via INTRAVENOUS

## 2018-10-11 MED ORDER — FENTANYL CITRATE (PF) 250 MCG/5ML IJ SOLN
INTRAMUSCULAR | Status: AC
  Filled 2018-10-11: qty 5

## 2018-10-11 MED ORDER — MIDAZOLAM HCL 2 MG/2ML IJ SOLN
INTRAMUSCULAR | Status: AC
  Filled 2018-10-11: qty 2

## 2018-10-11 MED ORDER — IBUPROFEN 600 MG PO TABS
600.0000 mg | ORAL_TABLET | Freq: Four times a day (QID) | ORAL | Status: DC | PRN
  Administered 2018-10-11: 600 mg via ORAL
  Filled 2018-10-11: qty 1

## 2018-10-11 MED ORDER — OXYCODONE-ACETAMINOPHEN 5-325 MG PO TABS
1.0000 | ORAL_TABLET | ORAL | Status: DC | PRN
  Administered 2018-10-11: 1 via ORAL

## 2018-10-11 MED ORDER — CEFAZOLIN SODIUM-DEXTROSE 2-4 GM/100ML-% IV SOLN
INTRAVENOUS | Status: AC
  Filled 2018-10-11: qty 100

## 2018-10-11 MED ORDER — PROPOFOL 10 MG/ML IV BOLUS
INTRAVENOUS | Status: AC
  Filled 2018-10-11: qty 20

## 2018-10-11 MED ORDER — ACETAMINOPHEN 500 MG PO TABS
1000.0000 mg | ORAL_TABLET | ORAL | Status: AC
  Administered 2018-10-11: 1000 mg via ORAL

## 2018-10-11 MED ORDER — LIDOCAINE HCL 1 % IJ SOLN
INTRAMUSCULAR | Status: DC | PRN
  Administered 2018-10-11: 20 mL

## 2018-10-11 SURGICAL SUPPLY — 37 items
APPLIER CLIP ROT 10 11.4 M/L (STAPLE) ×3
CHLORAPREP W/TINT 26ML (MISCELLANEOUS) ×3 IMPLANT
CLIP APPLIE ROT 10 11.4 M/L (STAPLE) ×1 IMPLANT
COVER WAND RF STERILE (DRAPES) ×3 IMPLANT
DECANTER SPIKE VIAL GLASS SM (MISCELLANEOUS) ×3 IMPLANT
DERMABOND ADVANCED (GAUZE/BANDAGES/DRESSINGS) ×2
DERMABOND ADVANCED .7 DNX12 (GAUZE/BANDAGES/DRESSINGS) ×1 IMPLANT
DRESSING SURGICEL FIBRLLR 1X2 (HEMOSTASIS) IMPLANT
DRSG SURGICEL FIBRILLAR 1X2 (HEMOSTASIS)
ELECT REM PT RETURN 9FT ADLT (ELECTROSURGICAL) ×3
ELECTRODE REM PT RTRN 9FT ADLT (ELECTROSURGICAL) ×1 IMPLANT
GLOVE BIO SURGEON STRL SZ7 (GLOVE) ×3 IMPLANT
GLOVE BIOGEL PI IND STRL 7.5 (GLOVE) ×1 IMPLANT
GLOVE BIOGEL PI INDICATOR 7.5 (GLOVE) ×2
GOWN STRL REUS W/ TWL LRG LVL3 (GOWN DISPOSABLE) ×3 IMPLANT
GOWN STRL REUS W/TWL LRG LVL3 (GOWN DISPOSABLE) ×6
GRASPER SUT TROCAR 14GX15 (MISCELLANEOUS) ×3 IMPLANT
IRRIGATION STRYKERFLOW (MISCELLANEOUS) IMPLANT
IRRIGATOR STRYKERFLOW (MISCELLANEOUS)
IV NS 1000ML (IV SOLUTION) ×2
IV NS 1000ML BAXH (IV SOLUTION) ×1 IMPLANT
KIT TURNOVER KIT A (KITS) ×3 IMPLANT
LABEL OR SOLS (LABEL) ×3 IMPLANT
NEEDLE HYPO 22GX1.5 SAFETY (NEEDLE) ×3 IMPLANT
NEEDLE INSUFFLATION 14GA 120MM (NEEDLE) ×3 IMPLANT
NS IRRIG 1000ML POUR BTL (IV SOLUTION) ×3 IMPLANT
PACK LAP CHOLECYSTECTOMY (MISCELLANEOUS) ×3 IMPLANT
PORT ACCESS TROCAR AIRSEAL 12 (TROCAR) ×1 IMPLANT
PORT ACCESS TROCAR AIRSEAL 5M (TROCAR) ×2
POUCH SPECIMEN RETRIEVAL 10MM (ENDOMECHANICALS) ×3 IMPLANT
SCISSORS METZENBAUM CVD 33 (INSTRUMENTS) IMPLANT
SET TRI-LUMEN FLTR TB AIRSEAL (TUBING) ×3 IMPLANT
SLEEVE ENDOPATH XCEL 5M (ENDOMECHANICALS) ×6 IMPLANT
SUT MNCRL AB 4-0 PS2 18 (SUTURE) ×3 IMPLANT
SUT VICRYL 0 UR6 27IN ABS (SUTURE) ×3 IMPLANT
SUT VICRYL AB 3-0 FS1 BRD 27IN (SUTURE) ×3 IMPLANT
TROCAR XCEL NON-BLD 5MMX100MML (ENDOMECHANICALS) ×3 IMPLANT

## 2018-10-11 NOTE — Discharge Instructions (Addendum)
In addition to included general post-operative instructions for Laparoscopic Cholecystectomy,  Diet: Resume home heart healthy diet (may prefer to start with low fat foods as discussed).   Activity: No heavy lifting >15 - 20 pounds (children, pets, laundry, garbage) or strenuous activity until follow-up, but light activity and walking are encouraged. Do not drive or drink alcohol if taking narcotic pain medications.  Wound care: 2 days after surgery (Wednesday, 1/15), you may shower/get incision wet with soapy water and pat dry (do not rub incisions), but no baths or submerging incision underwater until follow-up.   Medications: Resume all home medications. For mild to moderate pain: acetaminophen (Tylenol) or ibuprofen/naproxen (if no kidney disease). Combining Tylenol with alcohol can substantially increase your risk of causing liver disease. Narcotic pain medications, if prescribed, can be used for severe pain, though may cause nausea, constipation, and drowsiness. Do not combine Tylenol and Percocet (or similar) within a 6 hour period as Percocet (and similar) contain(s) Tylenol. If you do not need the narcotic pain medication, you do not need to fill the prescription.  Call office [phone]) at any time if any questions, worsening pain, fevers/chills, bleeding, drainage from incision site, or other concerns.   AMBULATORY SURGERY  DISCHARGE INSTRUCTIONS   1) The drugs that you were given will stay in your system until tomorrow so for the next 24 hours you should not:  A) Drive an automobile B) Make any legal decisions C) Drink any alcoholic beverage   2) You may resume regular meals tomorrow.  Today it is better to start with liquids and gradually work up to solid foods.  You may eat anything you prefer, but it is better to start with liquids, then soup and crackers, and gradually work up to solid foods.   3) Please notify your doctor immediately if you have any unusual bleeding,  trouble breathing, redness and pain at the surgery site, drainage, fever, or pain not relieved by medication.    4) Additional Instructions:        Please contact your physician with any problems or Same Day Surgery at [phone], Monday through Friday 6 am to 4 pm, or [place] at [facility] ([facility]) number at [phone].

## 2018-10-11 NOTE — Transfer of Care (Signed)
Immediate Anesthesia Transfer of Care Note  Patient: Sonya Spencer  Procedure(s) Performed: LAPAROSCOPIC CHOLECYSTECTOMY (N/A )  Patient Location: PACU  Anesthesia Type:General  Level of Consciousness: awake and sedated  Airway & Oxygen Therapy: Patient Spontanous Breathing and Patient connected to face mask oxygen  Post-op Assessment: Report given to RN and Post -op Vital signs reviewed and stable  Post vital signs: Reviewed and stable  Last Vitals:  Vitals Value Taken Time  BP    Temp    Pulse 83 10/11/2018 11:16 AM  Resp 16 10/11/2018 11:16 AM  SpO2 96 % 10/11/2018 11:16 AM  Vitals shown include unvalidated device data.  Last Pain:  Vitals:   10/11/18 0803  TempSrc: Oral  PainSc: 0-No pain         Complications: No apparent anesthesia complications

## 2018-10-11 NOTE — Interval H&P Note (Signed)
History and Physical Interval Note:  10/11/2018 9:26 AM  Sonya Spencer  has presented today for surgery, with the diagnosis of SX CHOLE  The various methods of treatment have been discussed with the patient and family. After consideration of risks, benefits and other options for treatment, the patient has consented to  Procedure(s): LAPAROSCOPIC CHOLECYSTECTOMY (N/A) as a surgical intervention .  The patient's history has been reviewed, patient examined, no change in status, stable for surgery.  I have reviewed the patient's chart and labs.  Questions were answered to the patient's satisfaction.     Vickie Epley

## 2018-10-11 NOTE — Anesthesia Postprocedure Evaluation (Signed)
Anesthesia Post Note  Patient: Occupational psychologist  Procedure(s) Performed: LAPAROSCOPIC CHOLECYSTECTOMY (N/A )  Patient location during evaluation: PACU Anesthesia Type: General Level of consciousness: awake and alert and oriented Pain management: pain level controlled Vital Signs Assessment: post-procedure vital signs reviewed and stable Respiratory status: spontaneous breathing, nonlabored ventilation and respiratory function stable Cardiovascular status: blood pressure returned to baseline and stable Postop Assessment: no signs of nausea or vomiting Anesthetic complications: no     Last Vitals:  Vitals:   10/11/18 1211 10/11/18 1301  BP: 135/82 108/68  Pulse: 95 76  Resp: 16 16  Temp: 37.2 C   SpO2: 98% 99%    Last Pain:  Vitals:   10/11/18 1301  TempSrc:   PainSc: 2                  Esbeydi Manago

## 2018-10-11 NOTE — Anesthesia Preprocedure Evaluation (Signed)
Anesthesia Evaluation  Patient identified by MRN, date of birth, ID band Patient awake    Reviewed: Allergy & Precautions, NPO status , Patient's Chart, lab work & pertinent test results  History of Anesthesia Complications Negative for: history of anesthetic complications  Airway Mallampati: III  TM Distance: >3 FB Neck ROM: Full    Dental  (+) Poor Dentition   Pulmonary asthma , neg sleep apnea, former smoker,    breath sounds clear to auscultation- rhonchi (-) wheezing      Cardiovascular Exercise Tolerance: Good (-) hypertension(-) CAD, (-) Past MI, (-) Cardiac Stents and (-) CABG  Rhythm:Regular Rate:Normal - Systolic murmurs and - Diastolic murmurs    Neuro/Psych  Headaches, PSYCHIATRIC DISORDERS Anxiety Depression    GI/Hepatic Neg liver ROS, GERD  ,  Endo/Other  neg diabetes  Renal/GU negative Renal ROS     Musculoskeletal negative musculoskeletal ROS (+)   Abdominal (+) + obese,   Peds  Hematology  (+) anemia ,   Anesthesia Other Findings Past Medical History: No date: Allergy No date: Anemia     Comment:  with pregnancy only No date: Anxiety No date: Asthma     Comment:  only with pregnancy No date: Cholelithiasis No date: Complication of anesthesia     Comment:  panic attack with first panic No date: Depression 03/25/2015: Fetal macrosomia during pregnancy in third trimester     Comment:  Resolved with delivery  No date: Frequent headaches     Comment:  migraines with pregnancy No date: GERD (gastroesophageal reflux disease) No date: Gestational diabetes No date: H/O maternal third degree perineal laceration, currently  pregnant 10/10/11; 08/02/14: History of Papanicolaou smear of cervix     Comment:  neg, ct neg; neg ct//gc/tr neg No date: PONV (postoperative nausea and vomiting) 11/09/2017: Supervision of normal pregnancy     Comment:  Clinic [facility] Prenatal Labs Dating  Blood type:               Genetic Screen 1 Screen:    AFP:     Quad:                   NIPS: Antibody:  Anatomic [place]  Rubella:   Varicella:                 GTT Early:               Third trimester:  RPR:                  Rhogam  HBsAg:    TDaP vaccine                       Flu               Shot: HIV:    Baby Food                                              GBS:  Contraception  Pap: CBB    CS/VBAC   Support               Person       No date: Urinary incontinence   Reproductive/Obstetrics (+) Breast feeding  Anesthesia Physical Anesthesia Plan  ASA: II  Anesthesia Plan: General   Post-op Pain Management:    Induction: Intravenous  PONV Risk Score and Plan: 2 and Ondansetron, Dexamethasone and Midazolam  Airway Management Planned: Oral ETT  Additional Equipment:   Intra-op Plan:   Post-operative Plan: Extubation in OR  Informed Consent: I have reviewed the patients History and Physical, chart, labs and discussed the procedure including the risks, benefits and alternatives for the proposed anesthesia with the patient or authorized representative who has indicated his/her understanding and acceptance.   Dental advisory given  Plan Discussed with: CRNA and Anesthesiologist  Anesthesia Plan Comments:         Anesthesia Quick Evaluation

## 2018-10-11 NOTE — Anesthesia Post-op Follow-up Note (Signed)
Anesthesia QCDR form completed.        

## 2018-10-11 NOTE — Anesthesia Procedure Notes (Signed)
Procedure Name: Intubation Date/Time: 10/11/2018 10:08 AM Performed by: Nelda Marseille, CRNA Pre-anesthesia Checklist: Patient identified, Patient being monitored, Timeout performed, Emergency Drugs available and Suction available Patient Re-evaluated:Patient Re-evaluated prior to induction Oxygen Delivery Method: Circle system utilized Preoxygenation: Pre-oxygenation with 100% oxygen Induction Type: IV induction Ventilation: Mask ventilation without difficulty Laryngoscope Size: Mac and 3 Grade View: Grade I Tube type: Oral Tube size: 7.0 mm Number of attempts: 1 Airway Equipment and Method: Stylet Placement Confirmation: ETT inserted through vocal cords under direct vision,  positive ETCO2 and breath sounds checked- equal and bilateral Secured at: 21 cm Tube secured with: Tape Dental Injury: Teeth and Oropharynx as per pre-operative assessment

## 2018-10-12 ENCOUNTER — Encounter: Payer: Self-pay | Admitting: Surgery

## 2018-10-12 LAB — SURGICAL PATHOLOGY

## 2018-10-12 NOTE — Op Note (Signed)
SURGICAL OPERATIVE REPORT   DATE OF PROCEDURE: 10/12/2018  ATTENDING Surgeon(s): Vickie Epley, MD  ASSISTANT(S): Wonda Amis, RNFA   ANESTHESIA: GETA  PRE-OPERATIVE DIAGNOSIS: Symptomatic Cholelithiasis (K80.20)  POST-OPERATIVE DIAGNOSIS: Chronic cholecystitis with extensive intermediate-sized cholelithiasis (K81.1)  PROCEDURE(S): (cpt's: 63785) 1.) Laparoscopic Cholecystectomy  INTRAOPERATIVE FINDINGS: Moderate pericholecystic inflammation with cystic duct and cystic artery clips well-secured, hemostasis at completion of procedure  INTRAOPERATIVE FLUIDS: 850 mL crystalloid   ESTIMATED BLOOD LOSS: Minimal (<30 mL)   URINE OUTPUT: No foley  SPECIMENS: Gallbladder  IMPLANTS: None  DRAINS: None   COMPLICATIONS: None apparent   CONDITION AT COMPLETION: Hemodynamically stable and extubated  DISPOSITION: PACU   INDICATION(S) FOR PROCEDURE:  Patient is a 33 y.o. female who recently presented with post-prandial RUQ > epigastric abdominal pain after eating fatty foods in particular both throughout and since her recent pregnancy. Ultrasound suggested cholelithiasis without sonographic evidence of cholecystitis. All risks, benefits, and alternatives to above elective procedures were discussed with the patient, who elected to proceed, and informed consent was accordingly obtained at that time.   DETAILS OF PROCEDURE:  Patient was brought to the operating suite and appropriately identified. General anesthesia was administered along with peri-operative prophylactic IV antibiotics, and endotracheal intubation was performed by anesthesiologist, along with NG/OG tube for gastric decompression. In supine position, operative site was prepped and draped in usual sterile fashion, and following a brief time out, initial 5 mm incision was made in a natural skin crease just above the umbilicus. Fascia was then elevated, and a Verress needle was inserted and its proper position confirmed  using aspiration and saline meniscus test.  Upon insufflation of the abdominal cavity with carbon dioxide to a well-tolerated pressure of 12-15 mmHg, 5 mm peri-umbilical port followed by laparoscope were inserted and used to inspect the abdominal cavity and its contents with no injuries from insertion of the first trochar noted. Three additional trocars were inserted, one at the epigastric position (12 mm AirSeal) and two along the Right costal margin (5 mm). The table was then placed in reverse Trendelenburg position with the Right side up. Filmy adhesions between the gallbladder and omentum/duodenum/transverse colon were lysed using combined blunt dissection and selective electrocautery. The apex/dome of the gallbladder was grasped with an atraumatic grasper passed through the lateral port and retracted apically over the liver. The infundibulum was also grasped and retracted, exposing Calot's triangle. The peritoneum overlying the gallbladder infundibulum was incised and dissected free of surrounding peritoneal attachments, revealing the cystic duct and cystic artery, which were clipped twice on the patient side and once on the gallbladder specimen side close to the gallbladder. The gallbladder was then dissected from its peritoneal attachments to the liver using electrocautery, and the gallbladder was placed into a laparoscopic specimen bag and removed from the abdominal cavity via the epigastric port site. Hemostasis and secure placement of clips were confirmed, and intra-peritoneal cavity was inspected with no additional findings. PMI laparoscopic fascial closure device was then used to re-approximate fascia at the 10 mm epigastric port site.  All ports were then removed under direct visualization, and abdominal cavity was desuflated. All port sites were irrigated/cleaned, additional local anesthetic was injected at each incision, 3-0 Vicryl was used to re-approximate dermis at 10 mm port site(s), and  subcuticular 4-0 Monocryl suture was used to re-approximate skin. Skin was then cleaned, dried, and sterile skin glue was applied. Patient was then safely able to be awakened, extubated, and transferred to PACU for post-operative monitoring and care.  I was present for all aspects of the above procedure, and no operative complications were apparent.

## 2018-10-13 ENCOUNTER — Telehealth: Payer: Self-pay | Admitting: *Deleted

## 2018-10-13 NOTE — Telephone Encounter (Signed)
Patient called and stated that she had surgery with Dr.Davis on 10/11/18 (gallbladder) and this morning around 4am she has been having stomach pain, she has vomited and also been nauseous, she has not had a fever but has had chills. Please call and advise.

## 2018-10-13 NOTE — Telephone Encounter (Signed)
Patient states she is having some stomach pains. She stated her bowels have not moved since before surgery so she used an enema and has had a bowel movement.  she was instructed to increase fluids and take miralax to help soften her stools today and tomorrow.  She denies fever, chills.  She stated she ate a lot yesterday but has had some nausea today. She will call back if her pain worsens. Pain is all in abdomen no particular location.

## 2018-10-14 ENCOUNTER — Telehealth: Payer: Self-pay | Admitting: *Deleted

## 2018-10-14 ENCOUNTER — Encounter: Payer: Self-pay | Admitting: Surgery

## 2018-10-14 NOTE — Telephone Encounter (Signed)
See MyChart messages.I called to talk with her. She states that she has a "cath" and some pain when she takes a deep breath, but it doesn't last. She denies shortness of breath with walking and she is trying to get outside some to walk as well to help with the "gas" pains. She has an inspirex/spirometry that she got from her parents that she is using as well. Aware that it should get better gradually but to call if if it worsens or she becomes short of breath, agrees.

## 2018-10-16 ENCOUNTER — Encounter: Payer: Self-pay | Admitting: Surgery

## 2018-10-18 ENCOUNTER — Encounter: Payer: Self-pay | Admitting: Surgery

## 2018-10-19 ENCOUNTER — Encounter: Payer: Self-pay | Admitting: Surgery

## 2018-10-26 ENCOUNTER — Ambulatory Visit (INDEPENDENT_AMBULATORY_CARE_PROVIDER_SITE_OTHER): Payer: Commercial Managed Care - PPO | Admitting: Surgery

## 2018-10-26 ENCOUNTER — Encounter: Payer: Self-pay | Admitting: Surgery

## 2018-10-26 ENCOUNTER — Other Ambulatory Visit: Payer: Self-pay

## 2018-10-26 VITALS — BP 119/81 | HR 91 | Temp 97.7°F | Ht 63.0 in | Wt 203.4 lb

## 2018-10-26 DIAGNOSIS — Z4889 Encounter for other specified surgical aftercare: Secondary | ICD-10-CM

## 2018-10-26 NOTE — Patient Instructions (Addendum)
Patient is to return to the office as need continue to lift no more than 40lbs for next 2 weeks . You can take a bath starting today.  Call the office with any questions or concerns.

## 2018-10-27 ENCOUNTER — Encounter: Payer: Self-pay | Admitting: Surgery

## 2018-10-27 NOTE — Progress Notes (Signed)
Surgical Clinic Progress/Follow-up Note   HPI:  33 y.o. Female presents to clinic for post-op follow-up 15 Days s/p laparoscopic cholecystectomy Rosana Hoes, 10/11/2018) for chronic cholecystitis. Patient reports complete resolution of pre-operative pain and has been tolerating regular diet with +flatus and normal BM's, denies N/V, fever/chills, CP, or SOB. Of note, patient also says she has continued to breastfeed her baby, who is also doing well.  Review of Systems:  Constitutional: denies fever/chills  Respiratory: denies shortness of breath, wheezing  Cardiovascular: denies chest pain, palpitations  Gastrointestinal: abdominal pain, N/V, and bowel function as per interval history Skin: Denies any other rashes or skin discolorations except post-surgical wounds as per interval history  Vital Signs:  BP 119/81   Pulse 91   Temp 97.7 F (36.5 C) (Temporal)   Ht 5\' 3"  (1.6 m)   Wt 203 lb 6.4 oz (92.3 kg)   SpO2 95%   BMI 36.03 kg/m    Physical Exam:  Constitutional:  -- Obese body habitus  -- Awake, alert, and oriented x3  Pulmonary:  -- No crackles -- Equal breath sounds bilaterally -- Breathing non-labored at rest Cardiovascular:  -- S1, S2 present  -- No pericardial rubs  Gastrointestinal:  -- Soft and non-distended, non-tender to palpation, no guarding/rebound tenderness -- Post-surgical incisions all well-approximated without any peri-incisional erythema or drainage -- No abdominal masses appreciated, pulsatile or otherwise  Musculoskeletal / Integumentary:  -- Wounds or skin discoloration: None appreciated except post-surgical incisions as described above (GI) -- Extremities: B/L UE and LE FROM, hands and feet warm, no edema   Imaging: No new pertinent imaging available for review  Assessment:  33 y.o. yo Female with a problem list including...  Patient Active Problem List   Diagnosis Date Noted  . Symptomatic cholelithiasis 10/06/2018  . Low-lying placenta  05/12/2018  . Fall (on) (from) other stairs and steps, initial encounter 03/05/2018  . Gestational diabetes mellitus (GDM) affecting third pregnancy 02/05/2018  . Abnormal glucose tolerance test (GTT) during pregnancy, antepartum 12/23/2017  . Supervision of high risk pregnancy, antepartum 11/16/2017  . History of macrosomia in infant in prior pregnancy, currently pregnant 11/09/2017  . Nausea and vomiting during pregnancy 11/09/2017  . History of cesarean delivery 11/09/2017  . Lipoma of axilla 06/22/2017  . Anxiety and depression 05/11/2017  . Frequent headaches 05/11/2017    presents to clinic for post-op follow-up evaluation, doing well 15 Days s/p laparoscopic cholecystectomy Rosana Hoes, 10/11/2018) for chronic cholecystitis.  Plan:              - advance diet as tolerated              - okay to submerge incisions under water (baths, swimming) prn             - gradually resume all activities without restrictions over next 2 weeks             - apply sunblock particularly to incisions with sun exposure to reduce pigmentation of scars             - return to clinic as needed, instructed to call office if any questions or concerns  All of the above recommendations were discussed with the patient, and all of patient's questions were answered to her expressed satisfaction.  -- Marilynne Drivers Rosana Hoes, MD, [place]: [facility] - Partnering for exceptional care. Office: [phone]

## 2018-10-29 DIAGNOSIS — K811 Chronic cholecystitis: Secondary | ICD-10-CM

## 2018-11-04 ENCOUNTER — Other Ambulatory Visit: Payer: Self-pay | Admitting: *Deleted

## 2018-11-04 ENCOUNTER — Encounter: Payer: Self-pay | Admitting: Surgery

## 2018-11-04 MED ORDER — OMEPRAZOLE 20 MG PO CPDR: 20.0000 mg | DELAYED_RELEASE_CAPSULE | Freq: Every day | ORAL | 0 refills | Status: DC

## 2018-11-04 NOTE — Telephone Encounter (Signed)
Refilled Prilosec for one month.

## 2018-11-11 ENCOUNTER — Encounter: Payer: Self-pay | Admitting: Surgery

## 2018-12-06 ENCOUNTER — Encounter: Payer: Self-pay | Admitting: Nurse Practitioner

## 2018-12-06 ENCOUNTER — Other Ambulatory Visit: Payer: Self-pay

## 2018-12-06 ENCOUNTER — Ambulatory Visit (INDEPENDENT_AMBULATORY_CARE_PROVIDER_SITE_OTHER): Payer: Commercial Managed Care - PPO | Admitting: Nurse Practitioner

## 2018-12-06 VITALS — BP 133/74 | HR 90 | Temp 98.5°F | Ht 63.0 in | Wt 209.8 lb

## 2018-12-06 DIAGNOSIS — O0990 Supervision of high risk pregnancy, unspecified, unspecified trimester: Secondary | ICD-10-CM

## 2018-12-06 DIAGNOSIS — F32A Depression, unspecified: Secondary | ICD-10-CM

## 2018-12-06 DIAGNOSIS — F329 Major depressive disorder, single episode, unspecified: Secondary | ICD-10-CM

## 2018-12-06 DIAGNOSIS — O99810 Abnormal glucose complicating pregnancy: Secondary | ICD-10-CM

## 2018-12-06 DIAGNOSIS — S40861A Insect bite (nonvenomous) of right upper arm, initial encounter: Secondary | ICD-10-CM | POA: Diagnosis not present

## 2018-12-06 DIAGNOSIS — W57XXXA Bitten or stung by nonvenomous insect and other nonvenomous arthropods, initial encounter: Secondary | ICD-10-CM

## 2018-12-06 DIAGNOSIS — F419 Anxiety disorder, unspecified: Secondary | ICD-10-CM

## 2018-12-06 DIAGNOSIS — K219 Gastro-esophageal reflux disease without esophagitis: Secondary | ICD-10-CM

## 2018-12-06 MED ORDER — BUPROPION HCL ER (XL) 300 MG PO TB24: 300.0000 mg | ORAL_TABLET | Freq: Every day | ORAL | 1 refills | Status: DC

## 2018-12-06 MED ORDER — TRIAMCINOLONE ACETONIDE 0.1 % EX CREA: 1.0000 "application " | TOPICAL_CREAM | Freq: Two times a day (BID) | CUTANEOUS | 0 refills | Status: DC

## 2018-12-06 MED ORDER — OMEPRAZOLE 20 MG PO CPDR: 20.0000 mg | DELAYED_RELEASE_CAPSULE | Freq: Every day | ORAL | 0 refills | Status: DC

## 2018-12-06 MED ORDER — ESCITALOPRAM OXALATE 20 MG PO TABS: 20.0000 mg | ORAL_TABLET | Freq: Every day | ORAL | 1 refills | Status: DC

## 2018-12-06 NOTE — Patient Instructions (Addendum)
Sonya Spencer,   Thank you for coming in to clinic today.  1. START triamcinolone cream twice daily for 14 days.   - If redness worsens or does not improve, call clinic or send mychart for possible cellulitis.  Not currently cellulitis.  Please schedule a follow-up appointment with Cassell Smiles, AGNP. Return if symptoms worsen or fail to improve.  If you have any other questions or concerns, please feel free to call the clinic or send a message through [place]. You may also schedule an earlier appointment if necessary.  You will receive a survey after today's visit either digitally by e-mail or paper by [employer]. Your experiences and feedback matter to [place].  Please respond so we know how we are doing as we provide care for you.   Cassell Smiles, DNP, AGNP-BC Adult Gerontology Nurse Practitioner [place]

## 2018-12-06 NOTE — Progress Notes (Signed)
Subjective:    Patient ID: Sonya Spencer, female    DOB: 04/23/1986, 33 y.o.   MRN: [id number]  Sonya Spencer is a 33 y.o. female presenting on 12/06/2018 for Rash (possible bites, pt was exposed to possible bed bugs  )   HPI Rash Possible bug bites - concerned about bed bugs because her grandmother had bed bugs to visit a couple times.   No other family members with symptoms. - Patient has not taken anything other than Zyrtec - Benadryl causes extreme drowsiness.  Social History   Tobacco Use  . Smoking status: Former Smoker    Packs/day: 0.25    Years: 2.00    Pack years: 0.50    Types: Cigarettes    Last attempt to quit: 03/13/2009    Years since quitting: 9.7  . Smokeless tobacco: Never Used  Substance Use Topics  . Alcohol use: Not Currently    Comment: not during pregnancy  . Drug use: No    Review of Systems Per HPI unless specifically indicated above     Objective:    BP 133/74 (BP Location: Right Arm, Patient Position: Sitting, Cuff Size: Normal)   Pulse 90   Temp 98.5 F (36.9 C) (Oral)   Ht 5\' 3"  (1.6 m)   Wt 209 lb 12.8 oz (95.2 kg)   BMI 37.16 kg/m   Wt Readings from Last 3 Encounters:  12/06/18 209 lb 12.8 oz (95.2 kg)  10/26/18 203 lb 6.4 oz (92.3 kg)  10/05/18 202 lb (91.6 kg)    Physical Exam Vitals signs reviewed.  Constitutional:      General: She is not in acute distress.    Appearance: She is well-developed.  HENT:     Head: Normocephalic and atraumatic.  Skin:    General: Skin is warm and dry.       Neurological:     Mental Status: She is alert and oriented to person, place, and time.  Psychiatric:        Behavior: Behavior normal.     Results for orders placed or performed during the hospital encounter of 10/11/18  Pregnancy, urine POC  Result Value Ref Range   Preg Test, Ur NEGATIVE NEGATIVE  Surgical pathology  Result Value Ref Range   SURGICAL PATHOLOGY      Surgical Pathology CASE: [id number] PATIENT: Sonya  Spencer Surgical Pathology Report     SPECIMEN SUBMITTED: A. Gallbladder  CLINICAL HISTORY: None provided  PRE-OPERATIVE DIAGNOSIS: SX chole  POST-OPERATIVE DIAGNOSIS: Same as pre op     DIAGNOSIS: A. GALLBLADDER, CHOLECYSTECTOMY: - ACUTE CHOLECYSTITIS AND CHOLELITHIASIS. - CYSTIC DUCT LYMPH NODES WITH REACTIVE CHANGES.   GROSS DESCRIPTION: A. Labeled: Gallbladder Received: Formalin Size of specimen: 11.5 x 2.7 x 2.5 cm Previously opened: No External surface: Tan-pink and smooth with multifocal areas of minute hemorrhage Wall thickness: 0.2 to 0.5 cm Mucosa: The mucosa involving the gallbladder neck displays a 3.7 x 1.5 cm focal area of tan-pink slightly raised, irregularity with an underlying thickened gallbladder wall up to 0.5 cm in thickness.  This area is 2.0 cm from the cystic duct surgical resection margin.  The remaining mucosa is tan-green, velvety, and trabeculated.  No additional  abnormalities are grossly identified. Stones present: Yes; multiple yellow-green, multifaceted stones (9.0 x 4.0 x 1.0 cm in aggregate) are present in the lumen admixed with green, sludge-like bile. Other findings: Two cystic duct lymph node candidates measuring 0.7 and 2.0 cm in greatest dimension are identified.  Block  summary: 1 - cystic duct surgical resection margin (inked blue, en face) and normal gallbladder wall 2-6 - entire, area of irregular mucosa with underlying thickened gallbladder wall 7 - one, intact lymph node 8 - one, serially sectioned lymph node   Final Diagnosis performed by Raynelle Bring, MD.   Electronically signed 10/12/2018 2:11:05PM The electronic signature indicates that the named Attending Pathologist has evaluated the specimen  Technical component performed at [facility], [address], [place], [place] [address] Lab: [phone] Dir: Rush Farmer, MD, MMM  Professional component performed at [facility], [facility], [place], [place], [place] [address] Lab: [phone] Dir: Dellia Nims. Rubinas, MD       Assessment & Plan:   Problem List Items Addressed This Visit    None    Visit Diagnoses    Insect bite of right upper arm, initial encounter    -  Primary   Relevant Medications   triamcinolone cream (KENALOG) 0.1 %   Insect bite, unspecified site, initial encounter       Relevant Medications   triamcinolone cream (KENALOG) 0.1 %    Acute allergic dermatitis likely due to insect bite.  Should be self resolving, but may not due to severity of erythema.   - Apply triamcinolone cream bid x 14 days. - Follow-up prn and scheduled.   Meds ordered this encounter  Medications  . triamcinolone cream (KENALOG) 0.1 %    Sig: Apply 1 application topically 2 (two) times daily.    Dispense:  30 g    Refill:  0    Order Specific Question:   Supervising Provider    Answer:   Olin Hauser [[id number]]    Follow up plan: Return if symptoms worsen or fail to improve.  Cassell Smiles, DNP, AGPCNP-BC Adult Gerontology Primary Care Nurse Practitioner [facility] 12/06/2018, 3:15 PM

## 2018-12-09 ENCOUNTER — Ambulatory Visit (INDEPENDENT_AMBULATORY_CARE_PROVIDER_SITE_OTHER): Payer: Commercial Managed Care - PPO | Admitting: Nurse Practitioner

## 2018-12-09 ENCOUNTER — Other Ambulatory Visit: Payer: Self-pay

## 2018-12-09 VITALS — BP 112/79 | HR 89 | Ht 63.0 in | Wt 209.0 lb

## 2018-12-09 DIAGNOSIS — Z0000 Encounter for general adult medical examination without abnormal findings: Secondary | ICD-10-CM

## 2018-12-09 DIAGNOSIS — D1722 Benign lipomatous neoplasm of skin and subcutaneous tissue of left arm: Secondary | ICD-10-CM

## 2018-12-12 DIAGNOSIS — J00 Acute nasopharyngitis [common cold]: Secondary | ICD-10-CM | POA: Diagnosis not present

## 2018-12-12 DIAGNOSIS — R05 Cough: Secondary | ICD-10-CM | POA: Diagnosis not present

## 2018-12-13 ENCOUNTER — Telehealth: Payer: Commercial Managed Care - PPO | Admitting: Family

## 2018-12-13 ENCOUNTER — Encounter: Payer: Self-pay | Admitting: Nurse Practitioner

## 2018-12-13 DIAGNOSIS — R059 Cough, unspecified: Secondary | ICD-10-CM

## 2018-12-13 DIAGNOSIS — R05 Cough: Secondary | ICD-10-CM

## 2018-12-13 DIAGNOSIS — R0602 Shortness of breath: Secondary | ICD-10-CM

## 2018-12-13 NOTE — Progress Notes (Signed)
E-Visit for [employer] Virus Screening  Based on what you have shared with me, I feel your condition warrants further evaluation for Corona virus and I recommend that you be seen and evaluated "face to face".  [facility] will have a drive-through opening tomorrow at [address] [place], [place]. I have ordered the lab for you, but there is paper work to be completed that we can not do as of yet. Our IT department is working on this Urgently.   Approximately 5 minutes was spent documenting and reviewing patient's chart.    I recommend the following:  . If you are having a true medical emergency please call 911. . Since you are considered high risk for Corona virus virus because of a known exposure, fever, shortness of breath and cough, OR if you have severe symptoms of any kind, seek medical care at an emergency room.  . Please call ahead and tell them that you were seen by telemedicine and they have recommended that you have a face to face evaluation for Corona virus.  . [facility] Emergency Department [place], [place], [place] [address] [phone]  . [facility] Emergency Department [place], [place], [place] [address] [phone]  . [facility] Emergency Department [place], [place], [place] [address] [phone]  . [facility] Emergency Department [address] [place], [place], [place] [address] [phone]  . [facility] Emergency Department [place], [place], [place] [address] [phone]  NOTE: If you entered your credit card information for this eVisit, you will not be charged. You may see a "hold" on your card for the $35 but that hold will drop off and you will not have a charge processed.   Your e-visit answers were reviewed by a board certified advanced clinical practitioner to complete your personal care plan.  Thank you for using  e-Visits.

## 2018-12-14 ENCOUNTER — Encounter: Payer: Self-pay | Admitting: Nurse Practitioner

## 2018-12-14 NOTE — Progress Notes (Signed)
Subjective:    Patient ID: Sonya Spencer, female    DOB: 09/09/1986, 33 y.o.   MRN: [id number]  Sonya Spencer is a 34 y.o. female presenting on 12/09/2018 for No chief complaint on file.   HPI Annual Physical Exam Patient has been feeling well in general.  Has just started having URI symptoms 1 day ago but has son who has had RSV.  They have no acute concerns today. Rash is improving.  Sleeps 6-7 hours per night currently interrupted for feeding infant.  HEALTH MAINTENANCE: Weight/BMI: gradually increasing Physical activity: no regular other than activity required as mother for 4 children Diet: generally unhealthy Seatbelt: always Sunscreen: usually PAP: done at GYN Mammogram: Not indicated HIV: Neg with recent pregnancy Optometry: regular Dentistry: regular  VACCINES: Tetanus: recent pregnancy Influenza: recent pregnancy   Past Medical History:  Diagnosis Date  . Abnormal glucose tolerance test (GTT) during pregnancy, antepartum 12/23/2017   - pt check BG log.   - Lifestyles referral for presumptive GDM (also due to inordinate weight loss early in pregnancy) - will treat as GDM.  Patient has refused 3 hour gtt.    . Allergy   . Anemia    with pregnancy only  . Anxiety   . Asthma    only with pregnancy  . Cholelithiasis   . Complication of anesthesia    panic attack with first panic  . Depression   . Fetal macrosomia during pregnancy in third trimester 03/25/2015   Resolved with delivery   . Frequent headaches    migraines with pregnancy  . GERD (gastroesophageal reflux disease)   . Gestational diabetes   . Gestational diabetes mellitus (GDM) affecting third pregnancy 02/05/2018  . H/O maternal third degree perineal laceration, currently pregnant   . History of Papanicolaou smear of cervix 10/10/11; 08/02/14   neg, ct neg; neg ct//gc/tr neg  . PONV (postoperative nausea and vomiting)   . Supervision of normal pregnancy 11/09/2017   [facility] Prenatal Labs  Dating  Blood type:    Genetic Screen 1 Screen:    AFP:     Quad:     NIPS: Antibody:  Anatomic [place]  Rubella:   Varicella:   GTT Early:               Third trimester:  RPR:    Rhogam  HBsAg:    TDaP vaccine                       Flu Shot: HIV:    Baby Food                                GBS:  Contraception  Pap: CBB    CS/VBAC   Support Person        . Urinary incontinence    Past Surgical History:  Procedure Laterality Date  . CESAREAN SECTION N/A 03/22/2015   Procedure: CESAREAN SECTION;  Surgeon: Will Bonnet, MD;  Location: [facility] ORS;  Service: Obstetrics;  Laterality: N/A;  . CESAREAN SECTION WITH BILATERAL TUBAL LIGATION N/A 06/24/2018   Procedure: CESAREAN SECTION WITH BILATERAL TUBAL LIGATION;  Surgeon: Will Bonnet, MD;  Location: [facility] ORS;  Service: Obstetrics;  Laterality: N/A;  . CHOLECYSTECTOMY N/A 10/11/2018   Procedure: LAPAROSCOPIC CHOLECYSTECTOMY;  Surgeon: Vickie Epley, MD;  Location: [facility] ORS;  Service: General;  Laterality: N/A;  . FOREIGN BODY REMOVAL  Social History   Socioeconomic History  . Marital status: Married    Spouse name: Not on file  . Number of children: 3  . Years of education: 23  . Highest education level: Not on file  Occupational History  . Occupation: HOMEMAKER  Social Needs  . Financial resource strain: Not on file  . Food insecurity:    Worry: Not on file    Inability: Not on file  . Transportation needs:    Medical: Not on file    Non-medical: Not on file  Tobacco Use  . Smoking status: Former Smoker    Packs/day: 0.25    Years: 2.00    Pack years: 0.50    Types: Cigarettes    Last attempt to quit: 03/13/2009    Years since quitting: 9.7  . Smokeless tobacco: Never Used  Substance and Sexual Activity  . Alcohol use: Not Currently    Comment: not during pregnancy  . Drug use: No  . Sexual activity: Yes  Lifestyle  . Physical activity:    Days per week: Not on file    Minutes per session: Not on file  . Stress:  Not on file  Relationships  . Social connections:    Talks on phone: Not on file    Gets together: Not on file    Attends religious service: Not on file    Active member of club or organization: Not on file    Attends meetings of clubs or organizations: Not on file    Relationship status: Not on file  . Intimate partner violence:    Fear of current or ex partner: Not on file    Emotionally abused: Not on file    Physically abused: Not on file    Forced sexual activity: Not on file  Other Topics Concern  . Not on file  Social History Narrative  . Not on file   Family History  Problem Relation Age of Onset  . Lupus Mother   . Hepatitis C Mother   . Depression Mother   . Diabetes type I Father   . Diabetes Father        type 1  . Thyroid disease Father   . Lupus Sister   . Lupus Maternal Grandmother   . Diabetes Maternal Grandmother   . Diabetes type I Daughter   . Diabetes Daughter   . Lupus Maternal Aunt   . Diabetes Paternal Grandfather        type 1  . Diabetes Paternal Aunt        type 1 - runs in father's side   Current Outpatient Medications on File Prior to Visit  Medication Sig  . buPROPion (WELLBUTRIN XL) 300 MG 24 hr tablet Take 1 tablet (300 mg total) by mouth at bedtime.  . cetirizine (ZYRTEC) 10 MG tablet Take 10 mg by mouth every evening.   . escitalopram (LEXAPRO) 20 MG tablet Take 1 tablet (20 mg total) by mouth at bedtime.  Marland Kitchen ibuprofen (MOTRIN IB) 200 MG tablet Take 3 tablets (600 mg total) by mouth every 6 (six) hours as needed for moderate pain.  Marland Kitchen omeprazole (PRILOSEC) 20 MG capsule Take 1 capsule (20 mg total) by mouth daily.  . Prenatal Vit-Fe Fumarate-FA (PRENATAL PO) Take 3 tablets by mouth every evening.  . triamcinolone cream (KENALOG) 0.1 % Apply 1 application topically 2 (two) times daily.   No current facility-administered medications on file prior to visit.     Review of Systems  Constitutional: Negative for activity change, appetite  change and fatigue.  Respiratory: Negative for cough and shortness of breath.   Cardiovascular: Negative for chest pain, palpitations and leg swelling.  Gastrointestinal: Negative for constipation, diarrhea, nausea and vomiting.  Genitourinary: Negative for dysuria, frequency and urgency.  Musculoskeletal: Negative for arthralgias and myalgias.  Skin: Negative for rash.  Neurological: Negative for dizziness and headaches.  Psychiatric/Behavioral: Negative for dysphoric mood. The patient is not nervous/anxious.    Per HPI unless specifically indicated above     Objective:    There were no vitals taken for this visit.  Wt Readings from Last 3 Encounters:  12/06/18 209 lb 12.8 oz (95.2 kg)  10/26/18 203 lb 6.4 oz (92.3 kg)  10/05/18 202 lb (91.6 kg)    Physical Exam Vitals signs and nursing note reviewed.  Constitutional:      General: She is not in acute distress.    Appearance: She is well-developed.  HENT:     Head: Normocephalic and atraumatic.     Right Ear: External ear normal.     Left Ear: External ear normal.     Nose: Nose normal.  Eyes:     Conjunctiva/sclera: Conjunctivae normal.     Pupils: Pupils are equal, round, and reactive to light.  Neck:     Musculoskeletal: Normal range of motion and neck supple.     Thyroid: No thyromegaly.     Vascular: No JVD.     Trachea: No tracheal deviation.  Cardiovascular:     Rate and Rhythm: Normal rate and regular rhythm.     Heart sounds: Normal heart sounds. No murmur. No friction rub. No gallop.   Pulmonary:     Effort: Pulmonary effort is normal. No respiratory distress.     Breath sounds: Normal breath sounds.  Chest:       Comments: Breast exam deferred 2/2 breastfeeding female. Abdominal:     General: Bowel sounds are normal. There is no distension.     Palpations: Abdomen is soft.     Tenderness: There is no abdominal tenderness.  Musculoskeletal: Normal range of motion.  Lymphadenopathy:     Cervical: No  cervical adenopathy.  Skin:    General: Skin is warm and dry.  Neurological:     Mental Status: She is alert and oriented to person, place, and time.     Cranial Nerves: No cranial nerve deficit.  Psychiatric:        Behavior: Behavior normal.        Thought Content: Thought content normal.        Judgment: Judgment normal.       Assessment & Plan:   Problem List Items Addressed This Visit      Other   Lipoma of axilla   Relevant Orders   Ambulatory referral to Dermatology    Other Visit Diagnoses    Encounter for annual physical exam    -  Primary   Relevant Orders   Hemoglobin A1c   COMPLETE METABOLIC PANEL WITH GFR   CBC with Differential/Platelet   Lipid panel   TSH + free T4    Annual physical exam with no new findings.  Well adult with no acute concerns.  Plan: 1. Obtain health maintenance screenings as above according to age. - Increase physical activity to 30 minutes most days of the week.  - Eat healthy diet high in vegetables and fruits; low in refined carbohydrates. - Screening labs and tests as ordered 2. Return 1  year for annual physical.     Stable lipoma, but with increasing pain.  No complications currently.  Referral placed to dermatology. Follow-up prn.  Follow up plan: Return in about 1 year (around 12/09/2019) for annual physical.  Cassell Smiles, DNP, AGPCNP-BC Adult Gerontology Primary Care Nurse Practitioner [facility] 11/28/2018, 09:11 AM

## 2018-12-14 NOTE — Progress Notes (Signed)
You were advised to be seen face to face. Please consider those recommendations.   E-Visit for [employer] Virus Screening  Based on what you have shared with me, I feel your condition warrants further evaluation for Corona virus and I recommend that you be seen and evaluated "face to face". Our Emergency Departments are best equipped to handle patients with potential Corona Virus. I recommend the following:  . If you are having a true medical emergency please call 911. . Since you are considered high risk for Corona virus virus because of a known exposure, fever, shortness of breath and cough, OR if you have severe symptoms of any kind, seek medical care at an emergency room.  . Please call ahead and tell them that you were seen by telemedicine and they have recommended that you have a face to face evaluation for Corona virus.  . [facility] Emergency Department [place], [place], [place] [address] [phone]  . [facility] Emergency Department [place], [place], [place] [address] [phone]  . [facility] Emergency Department [place], [place], [place] [address] [phone]  . [facility] Emergency Department [address] [place], [place], [place] [address] [phone]  . [facility] Emergency Department [place], [place], [place] [address] [phone]  NOTE: If you entered your credit card information for this eVisit, you will not be charged. You may see a "hold" on your card for the $35 but that hold will drop off and you will not have a charge processed.   Your e-visit answers were reviewed by a board certified advanced clinical practitioner to complete your personal care plan.  Thank you for using e-Visits.

## 2018-12-15 ENCOUNTER — Encounter: Payer: Self-pay | Admitting: Nurse Practitioner

## 2018-12-15 DIAGNOSIS — R05 Cough: Secondary | ICD-10-CM

## 2018-12-15 DIAGNOSIS — R5081 Fever presenting with conditions classified elsewhere: Secondary | ICD-10-CM

## 2018-12-15 DIAGNOSIS — R059 Cough, unspecified: Secondary | ICD-10-CM

## 2018-12-16 ENCOUNTER — Telehealth: Payer: Self-pay | Admitting: Emergency Medicine

## 2018-12-20 ENCOUNTER — Telehealth: Payer: Self-pay | Admitting: Nurse Practitioner

## 2018-12-20 NOTE — Telephone Encounter (Signed)
There are no results yet.  We will be notified and will call once they return.  There are no treatment changes even if the test is positive.  Patient should continue quarantine until she is fever free for 3 days and has no respiratory symptoms for at least 7 days.

## 2018-12-20 NOTE — Telephone Encounter (Signed)
Pt. Called requesting test results. Pt call back # is  [phone]

## 2018-12-21 ENCOUNTER — Encounter: Payer: Self-pay | Admitting: Nurse Practitioner

## 2018-12-22 DIAGNOSIS — Z3483 Encounter for supervision of other normal pregnancy, third trimester: Secondary | ICD-10-CM | POA: Diagnosis not present

## 2018-12-22 DIAGNOSIS — Z3482 Encounter for supervision of other normal pregnancy, second trimester: Secondary | ICD-10-CM | POA: Diagnosis not present

## 2018-12-23 NOTE — Telephone Encounter (Signed)
Mychart

## 2018-12-30 DIAGNOSIS — R05 Cough: Secondary | ICD-10-CM | POA: Diagnosis not present

## 2018-12-30 DIAGNOSIS — S80862A Insect bite (nonvenomous), left lower leg, initial encounter: Secondary | ICD-10-CM | POA: Diagnosis not present

## 2018-12-30 DIAGNOSIS — L089 Local infection of the skin and subcutaneous tissue, unspecified: Secondary | ICD-10-CM | POA: Diagnosis not present

## 2018-12-31 LAB — NOVEL CORONAVIRUS, NAA: SARS-CoV-2, NAA: NOT DETECTED

## 2019-01-19 DIAGNOSIS — Z3482 Encounter for supervision of other normal pregnancy, second trimester: Secondary | ICD-10-CM | POA: Diagnosis not present

## 2019-01-19 DIAGNOSIS — Z3483 Encounter for supervision of other normal pregnancy, third trimester: Secondary | ICD-10-CM | POA: Diagnosis not present

## 2019-02-09 ENCOUNTER — Encounter: Payer: Self-pay | Admitting: Nurse Practitioner

## 2019-02-17 DIAGNOSIS — Z3482 Encounter for supervision of other normal pregnancy, second trimester: Secondary | ICD-10-CM | POA: Diagnosis not present

## 2019-02-17 DIAGNOSIS — Z3483 Encounter for supervision of other normal pregnancy, third trimester: Secondary | ICD-10-CM | POA: Diagnosis not present

## 2019-03-02 ENCOUNTER — Other Ambulatory Visit: Payer: Self-pay | Admitting: Family Medicine

## 2019-03-02 DIAGNOSIS — F329 Major depressive disorder, single episode, unspecified: Secondary | ICD-10-CM

## 2019-03-02 DIAGNOSIS — F32A Depression, unspecified: Secondary | ICD-10-CM

## 2019-03-03 ENCOUNTER — Other Ambulatory Visit: Payer: Self-pay | Admitting: Family Medicine

## 2019-03-03 DIAGNOSIS — F329 Major depressive disorder, single episode, unspecified: Secondary | ICD-10-CM

## 2019-03-03 DIAGNOSIS — F419 Anxiety disorder, unspecified: Secondary | ICD-10-CM

## 2019-03-04 ENCOUNTER — Other Ambulatory Visit: Payer: Self-pay | Admitting: Family Medicine

## 2019-03-04 ENCOUNTER — Telehealth: Payer: Self-pay | Admitting: Family Medicine

## 2019-03-04 DIAGNOSIS — F329 Major depressive disorder, single episode, unspecified: Secondary | ICD-10-CM

## 2019-03-04 DIAGNOSIS — F32A Depression, unspecified: Secondary | ICD-10-CM

## 2019-03-04 MED ORDER — ESCITALOPRAM OXALATE 20 MG PO TABS: 20.0000 mg | ORAL_TABLET | Freq: Every day | ORAL | 0 refills | Status: DC

## 2019-03-04 NOTE — Telephone Encounter (Signed)
[facility], she actually called [facility] to request this, it looks like Lauren sent her last one 20mg  tablets to [facility], I changed her back to the 20mg  instead of taking 2 of the 10mg   Nobie Putnam, [facility] 03/04/2019, 2:20 PM

## 2019-03-04 NOTE — Telephone Encounter (Signed)
Attempted to contact the pt, no answer. LMOM to return my call.  

## 2019-03-04 NOTE — Telephone Encounter (Signed)
Covering inbox for Cassell Smiles, AGPCNP-BC while she is out of office.  Can you call patient / pharmacy and clarify this?  This is now multiple refill requests for Escitalopram lexapro 10mg  daily, I have declined and sent back to pharmacy because she is on the 20mg  dosage, and she was given 90 day supply with refills on 12/06/18 by Lauren.  Can you clarify if she is on the 10 or the 20, and if she is out and what she needs.  Thank you!  Nobie Putnam, DO [facility] 03/04/2019, 12:21 PM

## 2019-03-08 ENCOUNTER — Encounter: Payer: Self-pay | Admitting: Family Medicine

## 2019-03-08 ENCOUNTER — Ambulatory Visit (INDEPENDENT_AMBULATORY_CARE_PROVIDER_SITE_OTHER): Payer: Commercial Managed Care - PPO | Admitting: Family Medicine

## 2019-03-08 ENCOUNTER — Other Ambulatory Visit: Payer: Self-pay

## 2019-03-08 VITALS — BP 112/65 | HR 82 | Temp 98.6°F | Resp 16 | Ht 63.0 in | Wt 209.0 lb

## 2019-03-08 DIAGNOSIS — N943 Premenstrual tension syndrome: Secondary | ICD-10-CM

## 2019-03-08 DIAGNOSIS — Z9851 Tubal ligation status: Secondary | ICD-10-CM

## 2019-03-08 DIAGNOSIS — R102 Pelvic and perineal pain: Secondary | ICD-10-CM

## 2019-03-08 DIAGNOSIS — R350 Frequency of micturition: Secondary | ICD-10-CM | POA: Diagnosis not present

## 2019-03-08 LAB — POCT URINALYSIS DIPSTICK
Appearance: NORMAL
Bilirubin, UA: NEGATIVE
Blood, UA: NEGATIVE
Glucose, UA: NEGATIVE
Ketones, UA: NEGATIVE
Leukocytes, UA: NEGATIVE
Nitrite, UA: NEGATIVE
Protein, UA: NEGATIVE
Spec Grav, UA: 1.02 (ref 1.010–1.025)
Urobilinogen, UA: 0.2 E.U./dL
pH, UA: 5 (ref 5.0–8.0)

## 2019-03-08 LAB — POCT URINE PREGNANCY: Preg Test, Ur: NEGATIVE

## 2019-03-08 NOTE — Progress Notes (Signed)
Subjective:    Patient ID: Sonya Spencer, female    DOB: 07-22-1986, 33 y.o.   MRN: [id number]  Sonya Spencer is a 33 y.o. female presenting on 03/08/2019 for Urinary Tract Infection (onset couple of weeks, cramps, fatigue)   HPI   Pelvic Cramping vs Pre-menstrual syndrome without menstrual cycle - S/p 8 months postpartum, s/p C-section. She did have gallbladder removed in 09/2018. In past history she had restarted menstrual cycle about 2 years after previous pregnancy on average, she has had 4 total pregnancies. Also after this last pregnancy she had bilateral tubal ligation - Symptoms 3-4 weeks - Feels more menstrual cramping  - Not taking NSAID or Tylenol - Admits occasional nausea vomiting rarely - Denies any actual menstrual cycle or bleeding, urinary frequency, dysuria, hematuria, cloudy, odor   Depression screen [facility] 2/9 03/08/2019 12/28/2017 10/05/2017  Decreased Interest 1 1 1   Down, Depressed, Hopeless 1 0 1  PHQ - 2 Score 2 1 2   Altered sleeping 0 - 2  Tired, decreased energy 2 - 1  Change in appetite 1 - 2  Feeling bad or failure about yourself  0 - 1  Trouble concentrating 3 - 1  Moving slowly or fidgety/restless 0 - 0  Suicidal thoughts 0 - 0  PHQ-9 Score 8 - 9  Difficult doing work/chores Not difficult at all - Somewhat difficult    Social History   Tobacco Use  . Smoking status: Former Smoker    Packs/day: 0.25    Years: 2.00    Pack years: 0.50    Types: Cigarettes    Last attempt to quit: 03/13/2009    Years since quitting: 9.9  . Smokeless tobacco: Never Used  Substance Use Topics  . Alcohol use: Not Currently    Comment: not during pregnancy  . Drug use: No    Review of Systems Per HPI unless specifically indicated above     Objective:    BP 112/65   Pulse 82   Temp 98.6 F (37 C) (Oral)   Resp 16   Ht 5\' 3"  (1.6 m)   Wt 209 lb (94.8 kg)   BMI 37.02 kg/m   Wt Readings from Last 3 Encounters:  03/08/19 209 lb (94.8 kg)  12/09/18 209  lb (94.8 kg)  12/06/18 209 lb 12.8 oz (95.2 kg)    Physical Exam Vitals signs and nursing note reviewed.  Constitutional:      General: She is not in acute distress.    Appearance: She is well-developed. She is not diaphoretic.     Comments: Well-appearing, comfortable, cooperative  HENT:     Head: Normocephalic and atraumatic.  Eyes:     General:        Right eye: No discharge.        Left eye: No discharge.     Conjunctiva/sclera: Conjunctivae normal.  Cardiovascular:     Rate and Rhythm: Normal rate.  Pulmonary:     Effort: Pulmonary effort is normal.  Skin:    General: Skin is warm and dry.     Findings: No erythema or rash.  Neurological:     Mental Status: She is alert and oriented to person, place, and time.  Psychiatric:        Behavior: Behavior normal.     Comments: Well groomed, good eye contact, normal speech and thoughts    Results for orders placed or performed in visit on 03/08/19  POCT urine pregnancy  Result Value Ref Range  Preg Test, Ur Negative Negative  POCT urinalysis dipstick  Result Value Ref Range   Color, UA Dorthia    Clarity, UA Clear    Glucose, UA Negative Negative   Bilirubin, UA neg    Ketones, UA neg    Spec Grav, UA 1.020 1.010 - 1.025   Blood, UA Negative    pH, UA 5.0 5.0 - 8.0   Protein, UA Negative Negative   Urobilinogen, UA 0.2 0.2 or 1.0 E.U./dL   Nitrite, UA Negative    Leukocytes, UA Negative Negative   Appearance normal    Odor none       Assessment & Plan:   Problem List Items Addressed This Visit    None    Visit Diagnoses    Pelvic cramping    -  Primary   Relevant Orders   POCT urine pregnancy (Completed)   POCT urinalysis dipstick (Completed)   Pre-menstrual syndrome       Urinary frequency       Relevant Orders   Urine Culture   POCT urinalysis dipstick (Completed)   S/P tubal ligation       Relevant Orders   POCT urine pregnancy (Completed)      No orders of the defined types were placed in this  encounter.  Clinically with persistent pelvic cramping and some other associated symptom with some possible urinary frequency, nausea, seems similar to pre-menstrual syndrome for her however she has not started a menstrual cycle. - She is 8 months postpartum s/p C-section and tubal ligation, question about possible symptoms resulting from tubal ligation - Followed by [facility]  Plan - Discussion today, seems less likely UTI based on history and preliminary test results, no antibiotics at this time, defer until pending culture - Negative UA dipstick, agree to run Urine Culture for completeness to rule out UTI, pending result within 48 hours approx - Urine Pregnancy test done today as well - NEGATIVE, patient informed  Recommend start trial Ibuprofen NSAID 400-600mg  q 8 hr PRN for now to see if improves  Follow-up next with established GYN for further consultation and diagnostics regarding her symptoms  Follow up plan: Return in about 1 week (around 03/15/2019), or if symptoms worsen or fail to improve, for pelvic cramping vs UTI.   Nobie Putnam, DO [facility] 03/08/2019, 9:58 AM

## 2019-03-08 NOTE — Patient Instructions (Addendum)
Thank you for coming to the office today.  Will check Urine Culture today, stay tuned for next 48 hours with result on MyChart.  Recommend anti inflammatory - Ibuprofen 400-600mg  as needed every 6 to 8 hours with food.  Recommend to start taking Tylenol Extra Strength 500mg  tabs - take 1 to 2 tabs per dose (max 1000mg ) every 6-8 hours for pain (take regularly, don't skip a dose for next 7 days), max 24 hour daily dose is 6 tablets or 3000mg . In the future you can repeat the same everyday Tylenol course for 1-2 weeks at a time.   Checked Urine pregnancy test  If ultimately not improving - recommend return to [place] to discuss further.   Please schedule a Follow-up Appointment to: Return in about 1 week (around 03/15/2019), or if symptoms worsen or fail to improve, for pelvic cramping vs UTI.  If you have any other questions or concerns, please feel free to call the office or send a message through [place]. You may also schedule an earlier appointment if necessary.  Additionally, you may be receiving a survey about your experience at our office within a few days to 1 week by e-mail or mail. We value your feedback.  Nobie Putnam, DO [place]

## 2019-03-09 LAB — CBC WITH DIFFERENTIAL/PLATELET
Absolute Monocytes: 441 cells/uL (ref 200–950)
Basophils Absolute: 63 cells/uL (ref 0–200)
Basophils Relative: 0.7 %
Eosinophils Absolute: 225 cells/uL (ref 15–500)
Eosinophils Relative: 2.5 %
HCT: 40.2 % (ref 35.0–45.0)
Hemoglobin: 13 g/dL (ref 11.7–15.5)
Lymphs Abs: 3564 cells/uL (ref 850–3900)
MCH: 27.1 pg (ref 27.0–33.0)
MCHC: 32.3 g/dL (ref 32.0–36.0)
MCV: 83.8 fL (ref 80.0–100.0)
MPV: 9.8 fL (ref 7.5–12.5)
Monocytes Relative: 4.9 %
Neutro Abs: 4707 cells/uL (ref 1500–7800)
Neutrophils Relative %: 52.3 %
Platelets: 360 10*3/uL (ref 140–400)
RBC: 4.8 10*6/uL (ref 3.80–5.10)
RDW: 14.1 % (ref 11.0–15.0)
Total Lymphocyte: 39.6 %
WBC: 9 10*3/uL (ref 3.8–10.8)

## 2019-03-09 LAB — COMPLETE METABOLIC PANEL WITH GFR
AG Ratio: 1.8 (calc) (ref 1.0–2.5)
ALT: 24 U/L (ref 6–29)
AST: 39 U/L — ABNORMAL HIGH (ref 10–30)
Albumin: 4.4 g/dL (ref 3.6–5.1)
Alkaline phosphatase (APISO): 90 U/L (ref 31–125)
BUN: 16 mg/dL (ref 7–25)
CO2: 23 mmol/L (ref 20–32)
Calcium: 9 mg/dL (ref 8.6–10.2)
Chloride: 106 mmol/L (ref 98–110)
Creat: 0.69 mg/dL (ref 0.50–1.10)
GFR, Est African American: 135 mL/min/{1.73_m2} (ref >=60)
GFR, Est Non African American: 117 mL/min/{1.73_m2} (ref >=60)
Globulin: 2.5 g/dL (calc) (ref 1.9–3.7)
Glucose, Bld: 79 mg/dL (ref 65–99)
Potassium: 4.4 mmol/L (ref 3.5–5.3)
Sodium: 139 mmol/L (ref 135–146)
Total Bilirubin: 0.3 mg/dL (ref 0.2–1.2)
Total Protein: 6.9 g/dL (ref 6.1–8.1)

## 2019-03-09 LAB — LIPID PANEL
Cholesterol: 267 mg/dL — ABNORMAL HIGH (ref <200)
HDL: 53 mg/dL (ref >=50)
LDL Cholesterol (Calc): 178 mg/dL (calc) — ABNORMAL HIGH
Non-HDL Cholesterol (Calc): 214 mg/dL (calc) — ABNORMAL HIGH (ref <130)
Total CHOL/HDL Ratio: 5 (calc) — ABNORMAL HIGH (ref <5.0)
Triglycerides: 202 mg/dL — ABNORMAL HIGH (ref <150)

## 2019-03-09 LAB — URINE CULTURE
MICRO NUMBER:: 551607
SPECIMEN QUALITY:: ADEQUATE

## 2019-03-09 LAB — TSH+FREE T4: TSH W/REFLEX TO FT4: 1.35 mIU/L

## 2019-03-09 LAB — HEMOGLOBIN A1C
Hgb A1c MFr Bld: 5.4 % of total Hgb (ref <5.7)
Mean Plasma Glucose: 108 (calc)
eAG (mmol/L): 6 (calc)

## 2019-03-10 ENCOUNTER — Other Ambulatory Visit: Payer: Self-pay | Admitting: Nurse Practitioner

## 2019-03-10 DIAGNOSIS — K219 Gastro-esophageal reflux disease without esophagitis: Secondary | ICD-10-CM

## 2019-04-10 ENCOUNTER — Telehealth: Payer: Commercial Managed Care - PPO | Admitting: Family

## 2019-04-10 DIAGNOSIS — R400 Somnolence: Secondary | ICD-10-CM

## 2019-04-10 DIAGNOSIS — R519 Headache, unspecified: Secondary | ICD-10-CM

## 2019-04-10 DIAGNOSIS — R42 Dizziness and giddiness: Secondary | ICD-10-CM

## 2019-04-10 NOTE — Progress Notes (Signed)
Based on what you shared with me, I feel your condition warrants further evaluation and I recommend that you be seen for a face to face office visit.  Given your symptoms you need to be seen face to face for further work-up to rule out causes of your symptoms.    NOTE: If you entered your credit card information for this eVisit, you will not be charged. You may see a "hold" on your card for the $35 but that hold will drop off and you will not have a charge processed.  If you are having a true medical emergency please call 911.     For an urgent face to face visit, [place] has five urgent care centers for your convenience:    [email or web address] to reserve your spot online an avoid wait times  [facility] [address] [place], [place] [address] Modified hours of operation: Monday-Friday, 12 PM to 6 PM  Closed Saturday & Sunday  *Across the street from [place] (New Address!) [address], [place], [place] [address] *Just off [address], across the road from [place] hours of operation: Monday-Friday, 12 PM to 6 PM  Closed Saturday & Sunday   The following sites will take your insurance:  . [facility]    [phone]                  Get Driving Directions  [address], [place] [address] . 10 am to 8 pm Monday-Friday . 12 pm to 8 pm Saturday-Sunday   . [facility] at [place]                  Get Driving Directions  [address], [place], [place] [address] . 8 am to 8 pm Monday-Friday . 9 am to 6 pm Saturday . 11 am to 6 pm Sunday   . [facility] at [place]                  Get Driving Directions   [address] [place], [place] [address] . 8 am to 8 pm Monday-Friday . 8 am to 4 pm Saturday-Sunday    . [facility] Health Urgent Care at  [place]                    Get Driving Directions  [phone]  [address], [place], [place] [address]  . Monday-Friday, 12 PM to 6 PM    Your e-visit answers were reviewed by a board certified advanced clinical practitioner to complete your personal care plan.  Thank you for using e-Visits.

## 2019-04-11 ENCOUNTER — Other Ambulatory Visit: Payer: Self-pay

## 2019-04-11 ENCOUNTER — Encounter: Payer: Self-pay | Admitting: Emergency Medicine

## 2019-04-11 ENCOUNTER — Ambulatory Visit
Admission: EM | Admit: 2019-04-11 | Discharge: 2019-04-11 | Disposition: A | Discharge status: Home / Self Care | Payer: Commercial Managed Care - PPO | Attending: Family Medicine | Admitting: Family Medicine

## 2019-04-11 DIAGNOSIS — H8110 Benign paroxysmal vertigo, unspecified ear: Secondary | ICD-10-CM | POA: Insufficient documentation

## 2019-04-11 LAB — COMPREHENSIVE METABOLIC PANEL
ALT: 25 U/L (ref 0–44)
AST: 41 U/L (ref 15–41)
Albumin: 4.2 g/dL (ref 3.5–5.0)
Alkaline Phosphatase: 97 U/L (ref 38–126)
Anion gap: 9 (ref 5–15)
BUN: 16 mg/dL (ref 6–20)
CO2: 23 mmol/L (ref 22–32)
Calcium: 9 mg/dL (ref 8.9–10.3)
Chloride: 104 mmol/L (ref 98–111)
Creatinine, Ser: 0.62 mg/dL (ref 0.44–1.00)
GFR calc Af Amer: >60 mL/min (ref >60)
GFR calc non Af Amer: >60 mL/min (ref >60)
Glucose, Bld: 108 mg/dL — ABNORMAL HIGH (ref 70–99)
Potassium: 3.9 mmol/L (ref 3.5–5.1)
Sodium: 136 mmol/L (ref 135–145)
Total Bilirubin: 0.5 mg/dL (ref 0.3–1.2)
Total Protein: 7.9 g/dL (ref 6.5–8.1)

## 2019-04-11 LAB — CBC WITH DIFFERENTIAL/PLATELET
Abs Immature Granulocytes: 0.04 10*3/uL (ref 0.00–0.07)
Basophils Absolute: 0 10*3/uL (ref 0.0–0.1)
Basophils Relative: 0 %
Eosinophils Absolute: 0.2 10*3/uL (ref 0.0–0.5)
Eosinophils Relative: 2 %
HCT: 40.4 % (ref 36.0–46.0)
Hemoglobin: 13.5 g/dL (ref 12.0–15.0)
Immature Granulocytes: 0 %
Lymphocytes Relative: 36 %
Lymphs Abs: 3.6 10*3/uL (ref 0.7–4.0)
MCH: 27.9 pg (ref 26.0–34.0)
MCHC: 33.4 g/dL (ref 30.0–36.0)
MCV: 83.5 fL (ref 80.0–100.0)
Monocytes Absolute: 0.4 10*3/uL (ref 0.1–1.0)
Monocytes Relative: 4 %
Neutro Abs: 5.6 10*3/uL (ref 1.7–7.7)
Neutrophils Relative %: 58 %
Platelets: 359 10*3/uL (ref 150–400)
RBC: 4.84 MIL/uL (ref 3.87–5.11)
RDW: 13.3 % (ref 11.5–15.5)
WBC: 9.9 10*3/uL (ref 4.0–10.5)
nRBC: 0 % (ref 0.0–0.2)

## 2019-04-11 NOTE — ED Triage Notes (Addendum)
Patient states she is having dizzy spells and "passed out 2 times yesterday."

## 2019-04-11 NOTE — Discharge Instructions (Signed)
Over the counter meclizine or dramamine as needed

## 2019-04-11 NOTE — ED Provider Notes (Signed)
[facility]    CSN: [id number] Arrival date & time: 04/11/19  1043     History   Chief Complaint Chief Complaint  Patient presents with  . Dizziness    HPI Sonya Spencer is a 33 y.o. female.   33 yo female with a c/o dizziness for the past 2 days. States yesterday she fell at home twice and when asked about loss of consciousness she denies having lost consciousness. States she was dizzy and fell to the floor but recalls what happened and states she was awake/alert. States she "felt like things in the room where moving". Denies any vision changes, vomiting, numbness/tingling, headache, fevers, chills, palpitations, chest pains. States she's had vertigo in the past.    Dizziness   Past Medical History:  Diagnosis Date  . Abnormal glucose tolerance test (GTT) during pregnancy, antepartum 12/23/2017   - pt check BG log.   - Lifestyles referral for presumptive GDM (also due to inordinate weight loss early in pregnancy) - will treat as GDM.  Patient has refused 3 hour gtt.    . Allergy   . Anemia    with pregnancy only  . Anxiety   . Asthma    only with pregnancy  . Cholelithiasis   . Complication of anesthesia    panic attack with first panic  . Depression   . Fetal macrosomia during pregnancy in third trimester 03/25/2015   Resolved with delivery   . Frequent headaches    migraines with pregnancy  . GERD (gastroesophageal reflux disease)   . Gestational diabetes   . Gestational diabetes mellitus (GDM) affecting third pregnancy 02/05/2018  . H/O maternal third degree perineal laceration, currently pregnant   . History of Papanicolaou smear of cervix 10/10/11; 08/02/14   neg, ct neg; neg ct//gc/tr neg  . PONV (postoperative nausea and vomiting)   . Supervision of normal pregnancy 11/09/2017   Clinic [facility] Prenatal Labs Dating  Blood type:    Genetic Screen 1 Screen:    AFP:     Quad:     NIPS: Antibody:  Anatomic [place]  Rubella:   Varicella:   GTT Early:                Third trimester:  RPR:    Rhogam  HBsAg:    TDaP vaccine                       Flu Shot: HIV:    Baby Food                                GBS:  Contraception  Pap: CBB    CS/VBAC   Support Person        . Urinary incontinence     Patient Active Problem List   Diagnosis Date Noted  . Chronic cholecystitis   . Symptomatic cholelithiasis 10/06/2018  . Low-lying placenta 05/12/2018  . Fall (on) (from) other stairs and steps, initial encounter 03/05/2018  . History of macrosomia in infant in prior pregnancy, currently pregnant 11/09/2017  . Nausea and vomiting during pregnancy 11/09/2017  . History of cesarean delivery 11/09/2017  . Lipoma of axilla 06/22/2017  . Anxiety and depression 05/11/2017  . Frequent headaches 05/11/2017    Past Surgical History:  Procedure Laterality Date  . CESAREAN SECTION N/A 03/22/2015   Procedure: CESAREAN SECTION;  Surgeon: Will Bonnet, MD;  Location: [facility] ORS;  Service: Obstetrics;  Laterality: N/A;  . CESAREAN SECTION WITH BILATERAL TUBAL LIGATION N/A 06/24/2018   Procedure: CESAREAN SECTION WITH BILATERAL TUBAL LIGATION;  Surgeon: Will Bonnet, MD;  Location: [facility] ORS;  Service: Obstetrics;  Laterality: N/A;  . CHOLECYSTECTOMY N/A 10/11/2018   Procedure: LAPAROSCOPIC CHOLECYSTECTOMY;  Surgeon: Vickie Epley, MD;  Location: [facility] ORS;  Service: General;  Laterality: N/A;  . FOREIGN BODY REMOVAL      OB History    Gravida  4   Para  4   Term  4   Preterm  0   AB  0   Living  4     SAB  0   TAB  0   Ectopic  0   Multiple  0   Live Births  4        Obstetric Comments  1st Menstrual Cycle:  13 1st Pregnancy:  23          Home Medications    Prior to Admission medications   Medication Sig Start Date End Date Taking? Authorizing Provider  albuterol (VENTOLIN HFA) 108 (90 Base) MCG/ACT inhaler TAKE 2 PUFFS BY MOUTH EVERY 6 HOURS AS NEEDED FOR WHEEZE 12/12/18  Yes [provider]  buPROPion (WELLBUTRIN  XL) 300 MG 24 hr tablet Take 1 tablet (300 mg total) by mouth at bedtime. 12/06/18  Yes Mikey College, NP  cetirizine (ZYRTEC) 10 MG tablet Take 10 mg by mouth every evening.    Yes [provider]  escitalopram (LEXAPRO) 20 MG tablet Take 1 tablet (20 mg total) by mouth at bedtime. 03/04/19  Yes Karamalegos, Devonne Doughty, DO  ibuprofen (MOTRIN IB) 200 MG tablet Take 3 tablets (600 mg total) by mouth every 6 (six) hours as needed for moderate pain. 10/11/18 10/11/19 Yes Vickie Epley, MD  levalbuterol [facility] HFA) 45 MCG/ACT inhaler TAKE 2 PUFFS BY MOUTH EVERY 6 HOURS AS NEEDED FOR WHEEZE 12/30/18  Yes [provider]  omeprazole (PRILOSEC) 20 MG capsule TAKE 1 CAPSULE BY MOUTH ONCE DAILY 03/10/19  Yes Karamalegos, Devonne Doughty, DO  Prenatal Vit-Fe Fumarate-FA (PRENATAL PO) Take 3 tablets by mouth every evening.   Yes [provider]  buPROPion (WELLBUTRIN XL) 150 MG 24 hr tablet Take 150 mg by mouth daily. 03/02/19   [provider]  triamcinolone cream (KENALOG) 0.1 % Apply 1 application topically 2 (two) times daily. 12/06/18   Mikey College, NP    Family History Family History  Problem Relation Age of Onset  . Lupus Mother   . Hepatitis C Mother   . Depression Mother   . Diabetes type I Father   . Diabetes Father        type 1  . Thyroid disease Father   . Lupus Sister   . Lupus Maternal Grandmother   . Diabetes Maternal Grandmother   . Diabetes type I Daughter   . Diabetes Daughter   . Lupus Maternal Aunt   . Diabetes Paternal Grandfather        type 1  . Diabetes Paternal Aunt        type 1 - runs in father's side    Social History Social History   Tobacco Use  . Smoking status: Former Smoker    Packs/day: 0.25    Years: 2.00    Pack years: 0.50    Types: Cigarettes    Quit date: 03/13/2009    Years since quitting: 10.0  . Smokeless tobacco: Never Used  Substance Use Topics  . Alcohol use: Not Currently    Comment: not  during pregnancy  . Drug use: No     Allergies   Nickel and Red dye   Review of Systems Review of Systems  Neurological: Positive for dizziness.     Physical Exam Triage Vital Signs ED Triage Vitals  Enc Vitals Group     BP 04/11/19 1107 124/84     Pulse Rate 04/11/19 1107 87     Resp 04/11/19 1107 16     Temp 04/11/19 1107 98.2 F (36.8 C)     Temp Source 04/11/19 1107 Oral     SpO2 04/11/19 1107 98 %     Weight 04/11/19 1106 208 lb (94.3 kg)     Height 04/11/19 1106 5\' 2"  (1.575 m)     Head Circumference --      Peak Flow --      Pain Score 04/11/19 1106 0     Pain Loc --      Pain Edu? --      Excl. in [place]? --    No data found.  Updated Vital Signs BP 124/84 (BP Location: Left Arm)   Pulse 87   Temp 98.2 F (36.8 C) (Oral)   Resp 16   Ht 5\' 2"  (1.575 m)   Wt 94.3 kg   SpO2 98%   Breastfeeding Yes   BMI 38.04 kg/m   Visual Acuity Right Eye Distance:   Left Eye Distance:   Bilateral Distance:    Right Eye Near:   Left Eye Near:    Bilateral Near:     Physical Exam Vitals signs and nursing note reviewed.  Constitutional:      General: She is not in acute distress.    Appearance: She is not toxic-appearing or diaphoretic.  Eyes:     Pupils: Pupils are equal, round, and reactive to light.  Cardiovascular:     Rate and Rhythm: Normal rate.     Pulses: Normal pulses.  Pulmonary:     Effort: Pulmonary effort is normal. No respiratory distress.     Breath sounds: Normal breath sounds. No stridor. No wheezing, rhonchi or rales.  Neurological:     General: No focal deficit present.     Mental Status: She is alert and oriented to person, place, and time.     Comments: Positive hallpike      UC Treatments / Results  Labs (all labs ordered are listed, but only abnormal results are displayed) Labs Reviewed  COMPREHENSIVE METABOLIC PANEL - Abnormal; Notable for the following components:      Result Value   Glucose, Bld 108 (*)    All other  components within normal limits  CBC WITH DIFFERENTIAL/PLATELET    EKG   Radiology No results found.  Procedures Procedures (including critical care time)  Medications Ordered in UC Medications - No data to display  Initial Impression / Assessment and Plan / UC Course  I have reviewed the triage vital signs and the nursing notes.  Pertinent labs & imaging results that were available during my care of the patient were reviewed by me and considered in my medical decision making (see chart for details).      Final Clinical Impressions(s) / UC Diagnoses   Final diagnoses:  Benign paroxysmal positional vertigo, unspecified laterality     Discharge Instructions     Over the counter meclizine or dramamine as needed    ED Prescriptions    None  1. Labs/ekg results (normal) and diagnosis reviewed with patient 2. Recommend supportive treatment as above 3. epley maneuver exercises 4. Follow-up prn if symptoms worsen or don't improve   Controlled Substance Prescriptions [place] Controlled Substance Registry consulted? Not Applicable   Norval Gable, MD 04/11/19 1504

## 2019-04-24 ENCOUNTER — Other Ambulatory Visit: Payer: Self-pay | Admitting: Obstetrics and Gynecology

## 2019-06-22 ENCOUNTER — Other Ambulatory Visit: Payer: Self-pay

## 2019-06-22 ENCOUNTER — Ambulatory Visit
Admission: EM | Admit: 2019-06-22 | Discharge: 2019-06-22 | Disposition: A | Discharge status: Home / Self Care | Payer: Commercial Managed Care - PPO | Attending: Emergency Medicine | Admitting: Emergency Medicine

## 2019-06-22 ENCOUNTER — Encounter: Payer: Self-pay | Admitting: Emergency Medicine

## 2019-06-22 DIAGNOSIS — R51 Headache: Secondary | ICD-10-CM | POA: Diagnosis not present

## 2019-06-22 DIAGNOSIS — G43909 Migraine, unspecified, not intractable, without status migrainosus: Secondary | ICD-10-CM

## 2019-06-22 MED ORDER — KETOROLAC TROMETHAMINE 60 MG/2ML IM SOLN
60.0000 mg | Freq: Once | INTRAMUSCULAR | Status: AC
  Administered 2019-06-22: 12:00:00 60 mg via INTRAMUSCULAR

## 2019-06-22 MED ORDER — ONDANSETRON 4 MG PO TBDP: 4.0000 mg | ORAL_TABLET | Freq: Three times a day (TID) | ORAL | 0 refills | Status: DC | PRN

## 2019-06-22 NOTE — ED Provider Notes (Signed)
[facility] ____________________________________________  Time seen: Approximately 11:56 AM  I have reviewed the triage vital signs and the nursing notes.   HISTORY  Chief Complaint Migraine  HPI Sonya Spencer is a 33 y.o. female presenting for evaluation of headache present for the last 2 to 3 days.  Patient states headache has been gradual in onset but had increased more this morning.  Over-the-counter Tylenol and ibuprofen has helped intermittently some but no complete resolution.  Patient reports history of similar headaches describing them as migraines, particularly during her pregnancies.  Denies any current chance of pregnancy.  Bilateral tubal ligation.  Last pregnancy was approximately 1 year ago and she is still supplementally nursing.  Describes headache as an aching throbbing right sided pain with accompanying light sensitivity, nausea with intermittent vomiting.  Has vomited 2-3 times total, none today.  Denies current nausea.  Denies vision changes, unilateral weakness, paresthesias, injury. No diarrhea, fevers, chest pain or shortness of breath, abdominal pain.  No recent injury.  Denies any current vision changes.  Last took Tylenol and ibuprofen around 9 AM this morning.  Has had a lot of stress recently as she has 4 children and having to do home school.  States currently she feels that the weather changes are contributing to her headache.  Denies other relieving factors.  Mikey College, NP: PCP    Past Medical History:  Diagnosis Date  . Abnormal glucose tolerance test (GTT) during pregnancy, antepartum 12/23/2017   - pt check BG log.   - Lifestyles referral for presumptive GDM (also due to inordinate weight loss early in pregnancy) - will treat as GDM.  Patient has refused 3 hour gtt.    . Allergy   . Anemia    with pregnancy only  . Anxiety   . Asthma    only with pregnancy  . Cholelithiasis   . Complication of anesthesia    panic attack with  first panic  . Depression   . Fetal macrosomia during pregnancy in third trimester 03/25/2015   Resolved with delivery   . Frequent headaches    migraines with pregnancy  . GERD (gastroesophageal reflux disease)   . Gestational diabetes   . Gestational diabetes mellitus (GDM) affecting third pregnancy 02/05/2018  . H/O maternal third degree perineal laceration, currently pregnant   . History of Papanicolaou smear of cervix 10/10/11; 08/02/14   neg, ct neg; neg ct//gc/tr neg  . PONV (postoperative nausea and vomiting)   . Supervision of normal pregnancy 11/09/2017   Clinic [facility] Prenatal Labs Dating  Blood type:    Genetic Screen 1 Screen:    AFP:     Quad:     NIPS: Antibody:  Anatomic [place]  Rubella:   Varicella:   GTT Early:               Third trimester:  RPR:    Rhogam  HBsAg:    TDaP vaccine                       Flu Shot: HIV:    Baby Food                                GBS:  Contraception  Pap: CBB    CS/VBAC   Support Person        . Urinary incontinence     Patient Active Problem List  Diagnosis Date Noted  . Chronic cholecystitis   . Symptomatic cholelithiasis 10/06/2018  . Low-lying placenta 05/12/2018  . Fall (on) (from) other stairs and steps, initial encounter 03/05/2018  . History of macrosomia in infant in prior pregnancy, currently pregnant 11/09/2017  . Nausea and vomiting during pregnancy 11/09/2017  . History of cesarean delivery 11/09/2017  . Lipoma of axilla 06/22/2017  . Anxiety and depression 05/11/2017  . Frequent headaches 05/11/2017    Past Surgical History:  Procedure Laterality Date  . CESAREAN SECTION N/A 03/22/2015   Procedure: CESAREAN SECTION;  Surgeon: Will Bonnet, MD;  Location: [facility] ORS;  Service: Obstetrics;  Laterality: N/A;  . CESAREAN SECTION WITH BILATERAL TUBAL LIGATION N/A 06/24/2018   Procedure: CESAREAN SECTION WITH BILATERAL TUBAL LIGATION;  Surgeon: Will Bonnet, MD;  Location: [facility] OR[facility];  Service: Obstetrics;  Laterality: N/A;   . CHOLECYSTECTOMY N/A 10/11/2018   Procedure: LAPAROSCOPIC CHOLECYSTECTOMY;  Surgeon: Vickie Epley, MD;  Location: [facility] OR[facility];  Service: General;  Laterality: N/A;  . FOREIGN BODY REMOVAL       No current facility-administered medications for this encounter.   Current Outpatient Medications:  .  buPROPion (WELLBUTRIN XL) 300 MG 24 hr tablet, Take 1 tablet (300 mg total) by mouth at bedtime., Disp: 90 tablet, Rfl: 1 .  cetirizine (ZYRTEC) 10 MG tablet, Take 10 mg by mouth every evening. , Disp: , Rfl:  .  escitalopram (LEXAPRO) 20 MG tablet, Take 1 tablet (20 mg total) by mouth at bedtime., Disp: 90 tablet, Rfl: 0 .  ibuprofen (MOTRIN IB) 200 MG tablet, Take 3 tablets (600 mg total) by mouth every 6 (six) hours as needed for moderate pain., Disp: 100 tablet, Rfl: 2 .  omeprazole (PRILOSEC) 20 MG capsule, TAKE 1 CAPSULE BY MOUTH ONCE DAILY, Disp: 30 capsule, Rfl: 2 .  Prenatal Vit-Fe Fumarate-FA (PRENATAL PO), Take 3 tablets by mouth every evening., Disp: , Rfl:  .  albuterol (VENTOLIN HFA) 108 (90 Base) MCG/ACT inhaler, TAKE 2 PUFFS BY MOUTH EVERY 6 HOURS AS NEEDED FOR WHEEZE, Disp: , Rfl:  .  buPROPion (WELLBUTRIN XL) 150 MG 24 hr tablet, Take 150 mg by mouth daily., Disp: , Rfl:  .  levalbuterol (XOPENEX HFA) 45 MCG/ACT inhaler, TAKE 2 PUFFS BY MOUTH EVERY 6 HOURS AS NEEDED FOR WHEEZE, Disp: , Rfl:  .  ondansetron (ZOFRAN ODT) 4 MG disintegrating tablet, Take 1 tablet (4 mg total) by mouth every 8 (eight) hours as needed., Disp: 10 tablet, Rfl: 0 .  triamcinolone cream (KENALOG) 0.1 %, Apply 1 application topically 2 (two) times daily., Disp: 30 g, Rfl: 0  Allergies Nickel and Red dye  Family History  Problem Relation Age of Onset  . Lupus Mother   . Hepatitis C Mother   . Depression Mother   . Diabetes type I Father   . Diabetes Father        type 1  . Thyroid disease Father   . Lupus Sister   . Lupus Maternal Grandmother   . Diabetes Maternal Grandmother   . Diabetes type I  Daughter   . Diabetes Daughter   . Lupus Maternal Aunt   . Diabetes Paternal Grandfather        type 1  . Diabetes Paternal Aunt        type 1 - runs in father's side    Social History Social History   Tobacco Use  . Smoking status: Former Smoker    Packs/day: 0.25  Years: 2.00    Pack years: 0.50    Types: Cigarettes    Quit date: 03/13/2009    Years since quitting: 10.2  . Smokeless tobacco: Never Used  Substance Use Topics  . Alcohol use: Not Currently    Comment: not during pregnancy  . Drug use: No    Review of Systems Constitutional: No fever/chills Eyes: No visual changes. ENT: No sore throat. Cardiovascular: Denies chest pain. Respiratory: Denies shortness of breath. Gastrointestinal: No abdominal pain. No diarrhea.  No constipation. Genitourinary: Negative for dysuria. Musculoskeletal: Negative for back pain. Skin: Negative for rash. Neurological: Positive for headaches. Negative focal weakness or numbness.   ____________________________________________   PHYSICAL EXAM:  VITAL SIGNS: ED Triage Vitals  Enc Vitals Group     BP 06/22/19 1136 (!) 119/56     Pulse Rate 06/22/19 1136 94     Resp 06/22/19 1136 18     Temp 06/22/19 1136 98.5 F (36.9 C)     Temp Source 06/22/19 1136 Oral     SpO2 06/22/19 1136 100 %     Weight 06/22/19 1133 204 lb (92.5 kg)     Height 06/22/19 1133 5\' 2"  (1.575 m)     Head Circumference --      Peak Flow --      Pain Score 06/22/19 1132 8     Pain Loc --      Pain Edu? --      Excl. in [place]? --     Constitutional: Alert and oriented. Well appearing and in no acute distress. Eyes: Conjunctivae are normal. PERRL. EOMI. no pain with EOMs. ENT      Head: Normocephalic and atraumatic.      Nose: No congestion/rhinnorhea.      Mouth/Throat: Mucous membranes are moist.Oropharynx non-erythematous. Cardiovascular: Normal rate, regular rhythm. Grossly normal heart sounds.  Good peripheral circulation. Respiratory: Normal  respiratory effort without tachypnea nor retractions. Breath sounds are clear and equal bilaterally. No wheezes, rales, rhonchi. Musculoskeletal: Steady gait.  Mild right trapezius tenderness palpation. Neurologic:  Normal speech and language. No gross focal neurologic deficits are appreciated. Speech is normal. No gait instability. 5/5 strength to bilateral upper and lower extremities.  No paresthesias.  No ataxia.  Normal finger-nose.  Negative Romberg. Skin:  Skin is warm, dry and intact. No rash noted. Psychiatric: Mood and affect are normal. Speech and behavior are normal. Patient exhibits appropriate insight and judgment   ___________________________________________   LABS (all labs ordered are listed, but only abnormal results are displayed)  Labs Reviewed - No data to display   PROCEDURES Procedures   INITIAL IMPRESSION / ASSESSMENT AND PLAN / ED COURSE  Pertinent labs & imaging results that were available during my care of the patient were reviewed by me and considered in my medical decision making (see chart for details).  Overall well-appearing patient.  Suspect migraine headache.  60 mg IM Toradol given once in urgent care.  PRN Zofran prescription given.  Encouraged rest, fluids, supportive care, avoidance of aggravating triggers as able.  Patient supplementally nurses, resume nursing later tonight.  Discussed strict follow-up and return parameters. Discussed indication, risks and benefits of medications with patient.  Discussed follow up with Primary care physician this week. Discussed follow up and return parameters including no resolution or any worsening concerns. Patient verbalized understanding and agreed to plan.   ____________________________________________   FINAL CLINICAL IMPRESSION(S) / ED DIAGNOSES  Final diagnoses:  Migraine without status migrainosus, not intractable, unspecified migraine type  ED Discharge Orders         Ordered    ondansetron  (ZOFRAN ODT) 4 MG disintegrating tablet  Every 8 hours PRN     06/22/19 1157           Note: This dictation was prepared with Dragon dictation along with smaller phrase technology. Any transcriptional errors that result from this process are unintentional.         Marylene Land, NP 06/22/19 1207

## 2019-06-22 NOTE — ED Triage Notes (Signed)
Pt c/o migraine with dizziness. Started about 2-3 days ago. She also has mild nausea and vomiting. She states that she took tylenol and ibuprofen around 10:00.

## 2019-06-22 NOTE — Discharge Instructions (Signed)
Rest. Drink plenty of fluids. Monitor and avoid triggers as able.  As discussed, avoid nursing until tonight.  Follow up with your primary care physician this week as needed. Return to Urgent care for new or worsening concerns.

## 2019-07-01 ENCOUNTER — Other Ambulatory Visit: Payer: Self-pay

## 2019-07-10 ENCOUNTER — Telehealth: Payer: Commercial Managed Care - PPO | Admitting: Family

## 2019-07-10 DIAGNOSIS — Z20828 Contact with and (suspected) exposure to other viral communicable diseases: Secondary | ICD-10-CM

## 2019-07-10 DIAGNOSIS — Z20822 Contact with and (suspected) exposure to covid-19: Secondary | ICD-10-CM

## 2019-07-10 MED ORDER — BENZONATATE 100 MG PO CAPS: 100.0000 mg | ORAL_CAPSULE | Freq: Three times a day (TID) | ORAL | 0 refills | Status: DC | PRN

## 2019-07-10 NOTE — Progress Notes (Signed)
E-Visit for Corona Virus Screening   Your current symptoms could be consistent with the coronavirus.  Many health care providers can now test patients at their office but not all are.  [facility] has multiple testing sites. For information on our COVID testing locations and hours go to [email or web address]  Please quarantine yourself while awaiting your test results.  We are enrolling you in our [place] for [place] . Daily you will receive a questionnaire within the [facility] website. Our COVID 19 response team willl be monitoriing your responses daily.  You can go to one of the  testing sites listed below, while they are opened (see hours). You do not need an order and will stay in your car during the test. You do need to self isolate until your results return and if positive 14 days from when your symptoms started and until you are 3 days symptom free.   Testing Locations (Monday - Friday, 8 a.m. - 3:30 p.m.) . [place]: [facility], [address] [place], [place]: [place], [place], [place], [place] (entrance off [address])  . [facility]: (Closed each Monday): Testing site relocated to the short stay covered drive at [facility]. (Use the [facility] entrance to [facility] next to [place] is a respiratory illness with symptoms that are similar to the flu. Symptoms are typically mild to moderate, but there have been cases of severe illness and death due to the virus. The following symptoms may appear 2-14 days after exposure: . Fever . Cough . Shortness of breath or difficulty breathing . Chills . Repeated shaking with chills . Muscle pain . Headache . Sore throat . New loss of taste or smell . Fatigue . Congestion or runny nose . Nausea or vomiting . Diarrhea  It is vitally important that if you feel that you  have an infection such as this virus or any other virus that you stay home and away from places where you may spread it to others.  You should self-quarantine for 14 days if you have symptoms that could potentially be coronavirus or have been in close contact a with a person diagnosed with COVID-19 within the last 2 weeks. You should avoid contact with people age 33 and older.   You should wear a mask or cloth face covering over your nose and mouth if you must be around other people or animals, including pets (even at home). Try to stay at least 6 feet away from other people. This will protect the people around you.  You can use medication such as A prescription cough medication called Tessalon Perles 100 mg. You may take 1-2 capsules every 8 hours as needed for cough.  You may also take acetaminophen (Tylenol) as needed for fever.   Reduce your risk of any infection by using the same precautions used for avoiding the common cold or flu:  Marland Kitchen Wash your hands often with soap and warm water for at least 20 seconds.  If soap and water are not readily available, use an alcohol-based hand sanitizer with at least 60% alcohol.  . If coughing or sneezing, cover your mouth and nose by coughing or sneezing into the elbow areas of your shirt or coat, into a tissue or into your sleeve (not your hands). . Avoid shaking hands with others and consider head nods or verbal greetings only. . Avoid touching  your eyes, nose, or mouth with unwashed hands.  . Avoid close contact with people who are sick. . Avoid places or events with large numbers of people in one location, like concerts or sporting events. . Carefully consider travel plans you have or are making. . If you are planning any travel outside or inside the [place], visit the CDC's Travelers' Health webpage for the latest health notices. . If you have some symptoms but not all symptoms, continue to monitor at home and seek medical attention if your symptoms  worsen. . If you are having a medical emergency, call 911.  HOME CARE . Only take medications as instructed by your medical team. . Drink plenty of fluids and get plenty of rest. . A steam or ultrasonic humidifier can help if you have congestion.   GET HELP RIGHT AWAY IF YOU HAVE EMERGENCY WARNING SIGNS** FOR COVID-19. If you or someone is showing any of these signs seek emergency medical care immediately. Call 911 or proceed to your closest emergency facility if: . You develop worsening high fever. . Trouble breathing . Bluish lips or face . Persistent pain or pressure in the chest . New confusion . Inability to wake or stay awake . You cough up blood. . Your symptoms become more severe  **This list is not all possible symptoms. Contact your medical provider for any symptoms that are sever or concerning to you.   MAKE SURE YOU   Understand these instructions.  Will watch your condition.  Will get help right away if you are not doing well or get worse.   Approximately 5 minutes was spent documenting and reviewing patient's chart.   Your e-visit answers were reviewed by a board certified advanced clinical practitioner to complete your personal care plan.  Depending on the condition, your plan could have included both over the counter or prescription medications.  If there is a problem please reply once you have received a response from your provider.  Your safety is important to [place].  If you have drug allergies check your prescription carefully.    You can use MyChart to ask questions about today's visit, request a non-urgent call back, or ask for a work or school excuse for 24 hours related to this e-Visit. If it has been greater than 24 hours you will need to follow up with your provider, or enter a new e-Visit to address those concerns. You will get an e-mail in the next two days asking about your experience.  I hope that your e-visit has been valuable and will speed your  recovery. Thank you for using e-visits.

## 2019-07-11 ENCOUNTER — Other Ambulatory Visit: Payer: Self-pay

## 2019-07-11 DIAGNOSIS — Z20822 Contact with and (suspected) exposure to covid-19: Secondary | ICD-10-CM

## 2019-07-12 LAB — NOVEL CORONAVIRUS, NAA: SARS-CoV-2, NAA: NOT DETECTED

## 2019-09-12 ENCOUNTER — Ambulatory Visit
Admission: EM | Admit: 2019-09-12 | Discharge: 2019-09-12 | Disposition: A | Discharge status: Home / Self Care | Payer: Commercial Managed Care - PPO | Attending: Family Medicine | Admitting: Family Medicine

## 2019-09-12 ENCOUNTER — Other Ambulatory Visit: Payer: Self-pay

## 2019-09-12 DIAGNOSIS — G43909 Migraine, unspecified, not intractable, without status migrainosus: Secondary | ICD-10-CM

## 2019-09-12 MED ORDER — KETOROLAC TROMETHAMINE 60 MG/2ML IM SOLN
60.0000 mg | Freq: Once | INTRAMUSCULAR | Status: AC
  Administered 2019-09-12: 16:00:00 60 mg via INTRAMUSCULAR

## 2019-09-12 NOTE — ED Provider Notes (Signed)
[facility] ____________________________________________  Time seen: Approximately 4:14 PM  I have reviewed the triage vital signs and the nursing notes.   HISTORY  Chief Complaint Migraine   HPI Sonya Spencer is a 33 y.o. female presenting for evaluation of headache.  Patient reports current headache is consistent with her migraine headache.  States it started yesterday afternoon and has gradually increased.  States that she has tried doing Tylenol, caffeine and a nap without resolution.  States it did help.  States she has had some mild intermittent nausea, no vomiting.  Some light sensitivity, no vision changes.  Denies fall, head injury, unilateral weakness, paresthesias, confusion, vision loss, chest pain or shortness of breath, fever or recent sickness.  States in the past Toradol helps well, request the same.  Denies other aggravating alleviating factors.  Denies pregnancy.  States he felt like the weather changes may be a precipitating factor for this current migraine.   Past Medical History:  Diagnosis Date   Abnormal glucose tolerance test (GTT) during pregnancy, antepartum 12/23/2017   - pt check BG log.   - Lifestyles referral for presumptive GDM (also due to inordinate weight loss early in pregnancy) - will treat as GDM.  Patient has refused 3 hour gtt.     Allergy    Anemia    with pregnancy only   Anxiety    Asthma    only with pregnancy   Cholelithiasis    Complication of anesthesia    panic attack with first panic   Depression    Fetal macrosomia during pregnancy in third trimester 03/25/2015   Resolved with delivery    Frequent headaches    migraines with pregnancy   GERD (gastroesophageal reflux disease)    Gestational diabetes    Gestational diabetes mellitus (GDM) affecting third pregnancy 02/05/2018   H/O maternal third degree perineal laceration, currently pregnant    History of Papanicolaou smear of cervix 10/10/11; 08/02/14    neg, ct neg; neg ct//gc/tr neg   PONV (postoperative nausea and vomiting)    Supervision of normal pregnancy 11/09/2017   [facility] Prenatal Labs Dating  Blood type:    Genetic Screen 1 Screen:    AFP:     Quad:     NIPS: Antibody:  Anatomic [place]  Rubella:   Varicella:   GTT Early:               Third trimester:  RPR:    Rhogam  HBsAg:    TDaP vaccine                       Flu Shot: HIV:    Baby Food                                GBS:  Contraception  Pap: CBB    CS/VBAC   Support Person         Urinary incontinence     Patient Active Problem List   Diagnosis Date Noted   Chronic cholecystitis    Symptomatic cholelithiasis 10/06/2018   Low-lying placenta 05/12/2018   Fall (on) (from) other stairs and steps, initial encounter 03/05/2018   History of macrosomia in infant in prior pregnancy, currently pregnant 11/09/2017   Nausea and vomiting during pregnancy 11/09/2017   History of cesarean delivery 11/09/2017   Lipoma of axilla 06/22/2017   Anxiety and depression 05/11/2017   Frequent headaches  05/11/2017    Past Surgical History:  Procedure Laterality Date   CESAREAN SECTION N/A 03/22/2015   Procedure: CESAREAN SECTION;  Surgeon: Will Bonnet, MD;  Location: [facility] ORS;  Service: Obstetrics;  Laterality: N/A;   CESAREAN SECTION WITH BILATERAL TUBAL LIGATION N/A 06/24/2018   Procedure: CESAREAN SECTION WITH BILATERAL TUBAL LIGATION;  Surgeon: Will Bonnet, MD;  Location: [facility] ORS;  Service: Obstetrics;  Laterality: N/A;   CHOLECYSTECTOMY N/A 10/11/2018   Procedure: LAPAROSCOPIC CHOLECYSTECTOMY;  Surgeon: Vickie Epley, MD;  Location: [facility] ORS;  Service: General;  Laterality: N/A;   FOREIGN BODY REMOVAL        Current Facility-Administered Medications:    ketorolac (TORADOL) injection 60 mg, 60 mg, Intramuscular, Once, Marylene Land, NP  Current Outpatient Medications:    albuterol (VENTOLIN HFA) 108 (90 Base) MCG/ACT inhaler, TAKE 2 PUFFS BY  MOUTH EVERY 6 HOURS AS NEEDED FOR WHEEZE, Disp: , Rfl:    benzonatate (TESSALON PERLES) 100 MG capsule, Take 1 capsule (100 mg total) by mouth 3 (three) times daily as needed., Disp: 20 capsule, Rfl: 0   buPROPion (WELLBUTRIN XL) 150 MG 24 hr tablet, Take 150 mg by mouth daily., Disp: , Rfl:    buPROPion (WELLBUTRIN XL) 300 MG 24 hr tablet, Take 1 tablet (300 mg total) by mouth at bedtime., Disp: 90 tablet, Rfl: 1   cetirizine (ZYRTEC) 10 MG tablet, Take 10 mg by mouth every evening. , Disp: , Rfl:    escitalopram (LEXAPRO) 20 MG tablet, Take 1 tablet (20 mg total) by mouth at bedtime., Disp: 90 tablet, Rfl: 0   ibuprofen (MOTRIN IB) 200 MG tablet, Take 3 tablets (600 mg total) by mouth every 6 (six) hours as needed for moderate pain., Disp: 100 tablet, Rfl: 2   levalbuterol (XOPENEX HFA) 45 MCG/ACT inhaler, TAKE 2 PUFFS BY MOUTH EVERY 6 HOURS AS NEEDED FOR WHEEZE, Disp: , Rfl:    omeprazole (PRILOSEC) 20 MG capsule, TAKE 1 CAPSULE BY MOUTH ONCE DAILY, Disp: 30 capsule, Rfl: 2   ondansetron (ZOFRAN ODT) 4 MG disintegrating tablet, Take 1 tablet (4 mg total) by mouth every 8 (eight) hours as needed., Disp: 10 tablet, Rfl: 0   Prenatal Vit-Fe Fumarate-FA (PRENATAL PO), Take 3 tablets by mouth every evening., Disp: , Rfl:    triamcinolone cream (KENALOG) 0.1 %, Apply 1 application topically 2 (two) times daily., Disp: 30 g, Rfl: 0  Allergies Nickel and Red dye  Family History  Problem Relation Age of Onset   Lupus Mother    Hepatitis C Mother    Depression Mother    Diabetes type I Father    Diabetes Father        type 1   Thyroid disease Father    Lupus Sister    Lupus Maternal Grandmother    Diabetes Maternal Grandmother    Diabetes type I Daughter    Diabetes Daughter    Lupus Maternal Aunt    Diabetes Paternal Grandfather        type 1   Diabetes Paternal Aunt        type 1 - runs in father's side    Social History Social History   Tobacco Use    Smoking status: Former Smoker    Packs/day: 0.25    Years: 2.00    Pack years: 0.50    Types: Cigarettes    Quit date: 03/13/2009    Years since quitting: 10.5   Smokeless tobacco: Never Used  Substance Use Topics  Alcohol use: Not Currently   Drug use: No    Review of Systems Constitutional: No fever Eyes: No visual changes. ENT: No sore throat. Cardiovascular: Denies chest pain. Respiratory: Denies shortness of breath. Gastrointestinal: No abdominal pain.  Some nausea, no vomiting.  No diarrhea.  Genitourinary: Negative for dysuria. Musculoskeletal: Negative for back pain. Skin: Negative for rash. Neurological: Positive for headaches. Negative focal weakness or numbness.   ____________________________________________   PHYSICAL EXAM:  VITAL SIGNS: ED Triage Vitals  Enc Vitals Group     BP 09/12/19 1545 115/76     Pulse Rate 09/12/19 1545 85     Resp 09/12/19 1545 16     Temp 09/12/19 1545 98.8 F (37.1 C)     Temp Source 09/12/19 1545 Oral     SpO2 09/12/19 1545 100 %     Weight 09/12/19 1547 200 lb (90.7 kg)     Height 09/12/19 1547 5\' 3"  (1.6 m)     Head Circumference --      Peak Flow --      Pain Score 09/12/19 1547 6     Pain Loc --      Pain Edu? --      Excl. in [place]? --     Constitutional: Alert and oriented. Well appearing and in no acute distress. Eyes: Conjunctivae are normal. PERRL. EOMI. No pain with EOMs.  ENT      Head: Normocephalic and atraumatic. Cardiovascular: Normal rate, regular rhythm. Grossly normal heart sounds.  Good peripheral circulation. Respiratory: Normal respiratory effort without tachypnea nor retractions. Breath sounds are clear and equal bilaterally. No wheezes, rales, rhonchi. Gastrointestinal: Soft and nontender. No distention. Normal Bowel sounds. No CVA tenderness. Musculoskeletal: No midline cervical, thoracic or lumbar tenderness to palpation.  Neurologic:  Normal speech and language. No gross focal neurologic  deficits are appreciated. Speech is normal. No gait instability. 5/5 strength bilateral upper and lower extremities.  Negative pronator drift.  Normal finger-nose.  No ataxia. Skin:  Skin is warm, dry and intact. No rash noted. Psychiatric: Mood and affect are normal. Speech and behavior are normal. Patient exhibits appropriate insight and judgment   ___________________________________________   LABS (all labs ordered are listed, but only abnormal results are displayed)  Labs Reviewed - No data to display   PROCEDURES Procedures    INITIAL IMPRESSION / ASSESSMENT AND PLAN / ED COURSE  Pertinent labs & imaging results that were available during my care of the patient were reviewed by me and considered in my medical decision making (see chart for details).  Well-appearing patient.  No acute distress.  Complains of headache, consistent with her previous migraines.  No focal neurological deficits.  60 mg IM Toradol given once.  Patient states she has Zofran at home if needed.  Encourage supportive care, rest, and fluids. Discussed indication, risks and benefits of medications with patient.   Discussed follow up with Primary care physician this week. Discussed follow up and return parameters including no resolution or any worsening concerns. Patient verbalized understanding and agreed to plan.   ____________________________________________   FINAL CLINICAL IMPRESSION(S) / ED DIAGNOSES  Final diagnoses:  Migraine without status migrainosus, not intractable, unspecified migraine type     ED Discharge Orders    None       Note: This dictation was prepared with Dragon dictation along with smaller phrase technology. Any transcriptional errors that result from this process are unintentional.         Marylene Land, NP 09/12/19  2015 ° °

## 2019-09-12 NOTE — Discharge Instructions (Addendum)
Rest. Drink plenty of fluids.  ° °Follow up with your primary care physician this week as needed. Return to Urgent care for new or worsening concerns.  ° °

## 2019-09-12 NOTE — ED Triage Notes (Signed)
Pt with her typical migraine mainly to right front and right back of head. Mild photophobia and no nausea. Has tried her usual sleep, caffeine, Tylenol. No improvement. Last time she was here she got Toradol and that helped.

## 2019-10-03 ENCOUNTER — Encounter: Payer: Self-pay | Admitting: Family Medicine

## 2019-10-03 ENCOUNTER — Ambulatory Visit (INDEPENDENT_AMBULATORY_CARE_PROVIDER_SITE_OTHER): Payer: Commercial Managed Care - PPO | Admitting: Family Medicine

## 2019-10-03 ENCOUNTER — Other Ambulatory Visit: Payer: Self-pay

## 2019-10-03 ENCOUNTER — Other Ambulatory Visit: Payer: Self-pay | Admitting: Family Medicine

## 2019-10-03 VITALS — BP 121/65 | HR 101 | Temp 97.7°F | Resp 16 | Ht 63.0 in | Wt 202.0 lb

## 2019-10-03 DIAGNOSIS — Z832 Family history of diseases of the blood and blood-forming organs and certain disorders involving the immune mechanism: Secondary | ICD-10-CM

## 2019-10-03 DIAGNOSIS — G43811 Other migraine, intractable, with status migrainosus: Secondary | ICD-10-CM

## 2019-10-03 DIAGNOSIS — R768 Other specified abnormal immunological findings in serum: Secondary | ICD-10-CM | POA: Diagnosis not present

## 2019-10-03 DIAGNOSIS — R7309 Other abnormal glucose: Secondary | ICD-10-CM

## 2019-10-03 DIAGNOSIS — F32A Depression, unspecified: Secondary | ICD-10-CM

## 2019-10-03 DIAGNOSIS — F329 Major depressive disorder, single episode, unspecified: Secondary | ICD-10-CM

## 2019-10-03 DIAGNOSIS — O0990 Supervision of high risk pregnancy, unspecified, unspecified trimester: Secondary | ICD-10-CM

## 2019-10-03 MED ORDER — SUMATRIPTAN SUCCINATE 50 MG PO TABS: 50.0000 mg | ORAL_TABLET | Freq: Once | ORAL | 2 refills | Status: DC | PRN

## 2019-10-03 MED ORDER — BUPROPION HCL ER (XL) 300 MG PO TB24: 300.0000 mg | ORAL_TABLET | Freq: Every day | ORAL | 1 refills | Status: DC

## 2019-10-03 MED ORDER — ESCITALOPRAM OXALATE 20 MG PO TABS: 20.0000 mg | ORAL_TABLET | Freq: Every day | ORAL | 1 refills | Status: DC

## 2019-10-03 NOTE — Patient Instructions (Addendum)
Thank you for coming to the office today.  Labs ordered.   1. You most likely have Chronic Migraine Headaches - Migraine headaches present differently for many patients, pain is usually throbbing or aching, often on one side of head or behind the eye. They tend to last for up to hours or days. In treating migraines, our goal is to 1) stop the headache and 2) prevent recurrence of headaches  Treatment to STOP the headache at this time: - Start with Sumatriptan 50mg  - take 1 immediately at onset of moderate to severe migraine headache, if unresolved or return within 2 hours then repeat dose 1 tablet, that is max dose for 24 hours. (Note - this medication can cause a brief episode of flushing and chest pressure or pain very soon after taking it. That is NORMAL, and it is the medicine taking effect and dilating some blood vessels. It should pass, and resolve within seconds to minutes after - it may not happen at all)  - Try this for 1-2 weeks, if absolutely NOT helping then contact me to discuss changes, possibly can double sumatriptan dose or try nasal spray - You can still take over the counter meds with Ibuprofen up to 600-800mg  per dose 3 times a day with food for a few days and may try Excedrin Migraine as needed, ONLY use these after you have tried Sumatriptan up to 2 doses, and try to limit their use if they are not effective  Treatment to PREVENT headaches: - Goal is to avoid triggers. We need to learn more details on what are your exact or possible headache triggers first. - Keep detailed headache diary (on printed handout) for possible triggers, bring this to your next visit to discuss further - Known possible triggers include caffeine, chocolate, alcohol, stress, weather changes, menstrual cycle, certain other foods - Also be aware that OTC pain meds/anti-inflammatories can cause rebound headache, they help resolve the headache but then after the effect wears off they can CAUSE a headache.  Try to taper down and stop these medications and allow them to get out of your system for 1-2 weeks    Look into some of these alternatives for Migraine Headache Prophylaxis or preventative treatment  Antidepressant Type - Venlafaxine, Amitriptyline Blood pressure meds - Metoprolol, Propanolol, Verapamil Anti Headache/Seizure/Nerve medications - Topamax, Gabapentin  Future medication options that can be considered if persistent severe migraines and if failed some of the above prevention medicines include: - Aimovig, Emgality, Ajovy   (these are once a month self injection medications that work directly to block migraine signals. They are relatively new, can be costly and have been proven to be very safe and effective. We can try to get them approved or for low cost if needed in future.)    Please schedule a Follow-up Appointment to: Return in about 4 weeks (around 10/31/2019), or if symptoms worsen or fail to improve, for 4 weeks migraine as needed.  If you have any other questions or concerns, please feel free to call the office or send a message through [place]. You may also schedule an earlier appointment if necessary.  Additionally, you may be receiving a survey about your experience at our office within a few days to 1 week by e-mail or mail. We value your feedback.  Nobie Putnam, DO [place]

## 2019-10-03 NOTE — Progress Notes (Addendum)
Subjective:    Patient ID: Sonya Spencer, female    DOB: 21-Dec-1985, 34 y.o.   MRN: [id number]  Sonya Spencer is a 34 y.o. female presenting on 10/03/2019 for Headache (went to urgent care twice 09/12/19 --got Toradol shot)   HPI   Urgent Care FOLLOW-UP VISIT  Hospital/Location: [facility] Date of UC Visit: 09/12/19  Reason for Presenting to UC - Headache Primary (+Secondary) Diagnosis: Migraine, status  FOLLOW-UP  - UC provider note and record have been reviewed - Patient presents today about 21 days after recent UC visit. Brief summary of recent course, patient had symptoms of migraine headache, recurrence, presented to UC on 09/12/19 - treated with Toradol injection, also has zofran PRN  Interval improved temporarily on Toradol from UC but headache has returned.  She describes history of mixed headaches migraine vs cluster headaches, can be variable, some frontal and posterior headaches, episodic at times, but has had a constant headache every day it seems, mild to moderate, occasionally can be severe.  She has had migraine headache in past with pregnancy. But not long history of it.  - Taking OTC Ibuprofen up to 400-600 and Tylenol higher doses, but limited benefit on these, usually don't solve the problem. She has had rebound headaches and is trying to avoid those.  No significant headache family history Admits associated nausea with headache, gets some dizzy spells. Also some hand tingling but thinks this is carpal tunnel  Denies numbness weakness, near syncope, chest pain, dyspnea, loss of vision vomiting   Depression screen [facility] 2/9 03/08/2019 12/28/2017 10/05/2017  Decreased Interest 1 1 1   Down, Depressed, Hopeless 1 0 1  PHQ - 2 Score 2 1 2   Altered sleeping 0 - 2  Tired, decreased energy 2 - 1  Change in appetite 1 - 2  Feeling bad or failure about yourself  0 - 1  Trouble concentrating 3 - 1  Moving slowly or fidgety/restless 0 - 0  Suicidal thoughts  0 - 0  PHQ-9 Score 8 - 9  Difficult doing work/chores Not difficult at all - Somewhat difficult    Social History   Tobacco Use  . Smoking status: Former Smoker    Packs/day: 0.25    Years: 2.00    Pack years: 0.50    Types: Cigarettes    Quit date: 03/13/2009    Years since quitting: 10.5  . Smokeless tobacco: Never Used  Substance Use Topics  . Alcohol use: Not Currently  . Drug use: No    Review of Systems Per HPI unless specifically indicated above     Objective:    BP 121/65   Pulse (!) 101   Temp 97.7 F (36.5 C) (Oral)   Resp 16   Ht 5' 3"  (1.6 m)   Wt 202 lb (91.6 kg)   BMI 35.78 kg/m   Wt Readings from Last 3 Encounters:  10/03/19 202 lb (91.6 kg)  09/12/19 200 lb (90.7 kg)  06/22/19 204 lb (92.5 kg)    Physical Exam Vitals and nursing note reviewed.  Constitutional:      General: She is not in acute distress.    Appearance: She is well-developed. She is not diaphoretic.     Comments: Well-appearing, comfortable, cooperative  HENT:     Head: Normocephalic and atraumatic.  Eyes:     General:        Right eye: No discharge.        Left eye: No discharge.  Conjunctiva/sclera: Conjunctivae normal.  Cardiovascular:     Rate and Rhythm: Normal rate.  Pulmonary:     Effort: Pulmonary effort is normal.  Skin:    General: Skin is warm and dry.     Findings: No erythema or rash.  Neurological:     Mental Status: She is alert and oriented to person, place, and time.  Psychiatric:        Behavior: Behavior normal.     Comments: Well groomed, good eye contact, normal speech and thoughts       Results for orders placed or performed in visit on 07/11/19  Novel Coronavirus, NAA ([facility])   Specimen: Oropharyngeal(OP) collection in vial transport medium   OROPHARYNGEA  TESTING  Result Value Ref Range   SARS-CoV-2, NAA Not Detected Not Detected   Results for orders placed or performed in visit on 10/03/19 (from the past 80 hour(s))  [place] - ANA      Status: Abnormal   Collection Time: 10/03/19 11:10 AM  Result Value Ref Range   Anti Nuclear Antibody (ANA) POSITIVE (A) NEGATIVE    Comment: ANA IFA is a first line screen for detecting the presence of up to approximately 150 autoantibodies in various autoimmune diseases. A positive ANA IFA result is suggestive of autoimmune disease and reflexes to titer and pattern. Further laboratory testing may be considered if clinically indicated. . For additional information, please refer to http://education.[facility].com/faq/FAQ177 (This link is being provided for informational/ educational purposes only.) .   Sed Rate (ESR)     Status: None   Collection Time: 10/03/19 11:10 AM  Result Value Ref Range   Sed Rate 19 0 - 20 mm/h  C-reactive protein     Status: None   Collection Time: 10/03/19 11:10 AM  Result Value Ref Range   CRP 5.6 <8.0 mg/L  [facility] - CMET w/ GFR CMP Complete Metabolic Panel physical     Status: None   Collection Time: 10/03/19 11:10 AM  Result Value Ref Range   Glucose, Bld 104 65 - 139 mg/dL    Comment: .        Non-fasting reference interval .    BUN 15 7 - 25 mg/dL   Creat 0.70 0.50 - 1.10 mg/dL   GFR, Est Non African American 115 > OR = 60 mL/min/1.22m   GFR, Est African American 134 > OR = 60 mL/min/1.770m  BUN/Creatinine Ratio NOT APPLICABLE 6 - 22 (calc)   Sodium 138 135 - 146 mmol/L   Potassium 4.1 3.5 - 5.3 mmol/L   Chloride 104 98 - 110 mmol/L   CO2 25 20 - 32 mmol/L   Calcium 9.7 8.6 - 10.2 mg/dL   Total Protein 7.1 6.1 - 8.1 g/dL   Albumin 4.5 3.6 - 5.1 g/dL   Globulin 2.6 1.9 - 3.7 g/dL (calc)   AG Ratio 1.7 1.0 - 2.5 (calc)   Total Bilirubin 0.4 0.2 - 1.2 mg/dL   Alkaline phosphatase (APISO) 76 31 - 125 U/L   AST 30 10 - 30 U/L   ALT 13 6 - 29 U/L  [facility] - CBC with Differential/Platelet physical     Status: None   Collection Time: 10/03/19 11:10 AM  Result Value Ref Range   WBC 9.4 3.8 - 10.8 Thousand/uL   RBC 4.61 3.80 - 5.10  Million/uL   Hemoglobin 12.9 11.7 - 15.5 g/dL   HCT 38.9 35.0 - 45.0 %   MCV 84.4 80.0 - 100.0 fL   MCH 28.0  27.0 - 33.0 pg   MCHC 33.2 32.0 - 36.0 g/dL   RDW 13.0 11.0 - 15.0 %   Platelets 361 140 - 400 Thousand/uL   MPV 10.9 7.5 - 12.5 fL   Neutro Abs 6,072 1,500 - 7,800 cells/uL   Lymphs Abs 2,679 850 - 3,900 cells/uL   Absolute Monocytes 461 200 - 950 cells/uL   Eosinophils Absolute 132 15 - 500 cells/uL   Basophils Absolute 56 0 - 200 cells/uL   Neutrophils Relative % 64.6 %   Total Lymphocyte 28.5 %   Monocytes Relative 4.9 %   Eosinophils Relative 1.4 %   Basophils Relative 0.6 %  Hemoglobin A1c     Status: None   Collection Time: 10/03/19 11:10 AM  Result Value Ref Range   Hgb A1c MFr Bld 5.3 <5.7 % of total Hgb    Comment: For the purpose of screening for the presence of diabetes: . <5.7%       Consistent with the absence of diabetes 5.7-6.4%    Consistent with increased risk for diabetes             (prediabetes) > or =6.5%  Consistent with diabetes . This assay result is consistent with a decreased risk of diabetes. . Currently, no consensus exists regarding use of hemoglobin A1c for diagnosis of diabetes in children. . According to American Diabetes Association (ADA) guidelines, hemoglobin A1c <7.0% represents optimal control in non-pregnant diabetic patients. Different metrics may apply to specific patient populations.  Standards of Medical Care in Diabetes(ADA). .    Mean Plasma Glucose 105 (calc)   eAG (mmol/L) 5.8 (calc)  Anti-nuclear ab-titer (ANA titer)     Status: Abnormal   Collection Time: 10/03/19 11:10 AM  Result Value Ref Range   ANA Titer 1 1:40 (H) titer    Comment: A low level ANA titer may be present in pre-clinical autoimmune diseases and normal individuals.                 Reference Range                 <1:40        Negative                 1:40-1:80    Low Antibody Level                 >1:80        Elevated Antibody Level .     ANA Pattern 1 Nuclear, Speckled (A)     Comment: Speckled pattern is associated with mixed connective tissue disease (MCTD), systemic lupus erythematosus (SLE), Sjogren's syndrome, dermatomyositis, and  systemic sclerosis/polymyositis overlap. . AC-2,4,5,29: Speckled . International Consensus on ANA Patterns (https://www.hernandez-brewer.com/)        Assessment & Plan:   Problem List Items Addressed This Visit    None    Visit Diagnoses    Other migraine with status migrainosus, intractable    -  Primary   Relevant Medications   SUMAtriptan (IMITREX) 50 MG tablet   Other Relevant Orders   [facility] - CMET w/ GFR CMP Complete Metabolic Panel physical   [place] - CBC with Differential/Platelet physical   Family history of autoimmune disorder       Relevant Orders   [facility] - ANA   Sed Rate (ESR)   C-reactive protein   [facility] - CMET w/ GFR CMP Complete Metabolic Panel physical   [place] - CBC with Differential/Platelet physical   Elevated  glucose       Relevant Orders   [facility] - A1c LAB Hemoglobin A1C physical      Consistent with persistent migraine HA x seems chronic most days, vs possible cluster headaches or other headache syndrome, now developing or worsening over past few weeks. Without other chronic migraine history - Currently without active HA, well-appearing, no focal neuro deficits, tolerating PO w/o n/v  Plan: 1. Start abortive therapy with Sumatriptan 63m tabs - take 1 PRN (#12, 0 refill due to quantity limit), severe HA, may repeat dose within 2 hr if persistent, no more in 24 hours, in future can titrate dose to 50-1039mif needed. Counseling on potential side effect / intolerance with chest discomfort acutely after taking sumatriptan - Also reviewed breastfeeding guidelines with this medication, considered safe, she should wait 4-6 hours after dose, or max up to 12 hours after dose.  Consider excedrin migraine PRN  - Discussion on future migraine prophylaxis  medications - handout given, review options at next visit, determine need if using sumatriptan frequently Can start headache diary, handout given, identify triggers for avoidance, bring to next visit  Return criteria given for acute migraine, when to go to office vs ED   -----  Additionally given fam history of lupus and other autoimmune Agree for additional blood work also with migranie - add CMET, CBC, ESR CRP and ANA today   Meds ordered this encounter  Medications  . SUMAtriptan (IMITREX) 50 MG tablet    Sig: Take 1 tablet (50 mg total) by mouth once as needed for up to 1 dose for migraine. May repeat one dose in 2 hours if headache persists, for max dose 24 hours    Dispense:  9 tablet    Refill:  2     Follow up plan: Return in about 4 weeks (around 10/31/2019), or if symptoms worsen or fail to improve, for 4 weeks migraine as needed.    [facility] 10/03/2019, 10:37 AM  -----------------  Addendum 10/05/19 5:08PM  Reviewed lab results, copied above.  Normal chemistry CBC High normal ESR 19. Normal CRP Abnormal ANA titer 1:40 low positive, speckled pattern - cannot rule out SLE and her family history autoimmune.  Called patient reviewed results. She viewed on mychart. Agree to pursue referral. [facility] testing and evaluation.  Also her migraine headache improved on sumatriptan.  Follow up as planned.  Orders Placed This Encounter  Procedures  . [facility] ANA  . Sed Rate (ESR)  . C-reactive protein  . [facility] CMET w/ GFR CMP Complete Metabolic Panel physical  . [facility] CBC with Differential/Platelet physical  . [facility] A1c LAB Hemoglobin A1C physical  . Hemoglobin A1c  . Anti-nuclear ab-titer (ANA titer)  . Ambulatory referral to Rheumatology    Referral Priority:   Routine    Referral Type:   Consultation    Referral  Reason:   Specialty Services Required    Requested Specialty:   Rheumatology    Number of Visits Requested:   1 [facility] 10/05/2019, 5:09 PM

## 2019-10-05 DIAGNOSIS — J302 Other seasonal allergic rhinitis: Secondary | ICD-10-CM

## 2019-10-05 LAB — CBC WITH DIFFERENTIAL/PLATELET
Absolute Monocytes: 461 cells/uL (ref 200–950)
Basophils Absolute: 56 cells/uL (ref 0–200)
Basophils Relative: 0.6 %
Eosinophils Absolute: 132 cells/uL (ref 15–500)
Eosinophils Relative: 1.4 %
HCT: 38.9 % (ref 35.0–45.0)
Hemoglobin: 12.9 g/dL (ref 11.7–15.5)
Lymphs Abs: 2679 cells/uL (ref 850–3900)
MCH: 28 pg (ref 27.0–33.0)
MCHC: 33.2 g/dL (ref 32.0–36.0)
MCV: 84.4 fL (ref 80.0–100.0)
MPV: 10.9 fL (ref 7.5–12.5)
Monocytes Relative: 4.9 %
Neutro Abs: 6072 cells/uL (ref 1500–7800)
Neutrophils Relative %: 64.6 %
Platelets: 361 10*3/uL (ref 140–400)
RBC: 4.61 10*6/uL (ref 3.80–5.10)
RDW: 13 % (ref 11.0–15.0)
Total Lymphocyte: 28.5 %
WBC: 9.4 10*3/uL (ref 3.8–10.8)

## 2019-10-05 LAB — COMPLETE METABOLIC PANEL WITH GFR
AG Ratio: 1.7 (calc) (ref 1.0–2.5)
ALT: 13 U/L (ref 6–29)
AST: 30 U/L (ref 10–30)
Albumin: 4.5 g/dL (ref 3.6–5.1)
Alkaline phosphatase (APISO): 76 U/L (ref 31–125)
BUN: 15 mg/dL (ref 7–25)
CO2: 25 mmol/L (ref 20–32)
Calcium: 9.7 mg/dL (ref 8.6–10.2)
Chloride: 104 mmol/L (ref 98–110)
Creat: 0.7 mg/dL (ref 0.50–1.10)
GFR, Est African American: 134 mL/min/{1.73_m2} (ref >=60)
GFR, Est Non African American: 115 mL/min/{1.73_m2} (ref >=60)
Globulin: 2.6 g/dL (calc) (ref 1.9–3.7)
Glucose, Bld: 104 mg/dL (ref 65–139)
Potassium: 4.1 mmol/L (ref 3.5–5.3)
Sodium: 138 mmol/L (ref 135–146)
Total Bilirubin: 0.4 mg/dL (ref 0.2–1.2)
Total Protein: 7.1 g/dL (ref 6.1–8.1)

## 2019-10-05 LAB — HEMOGLOBIN A1C
Hgb A1c MFr Bld: 5.3 % of total Hgb (ref <5.7)
Mean Plasma Glucose: 105 (calc)
eAG (mmol/L): 5.8 (calc)

## 2019-10-05 LAB — ANTI-NUCLEAR AB-TITER (ANA TITER): ANA Titer 1: 1:40 {titer} — ABNORMAL HIGH

## 2019-10-05 LAB — C-REACTIVE PROTEIN: CRP: 5.6 mg/L (ref <8.0)

## 2019-10-05 LAB — ANA: Anti Nuclear Antibody (ANA): POSITIVE — AB

## 2019-10-05 LAB — SEDIMENTATION RATE: Sed Rate: 19 mm/h (ref 0–20)

## 2019-10-05 NOTE — Addendum Note (Signed)
Addended by: Olin Hauser on: 10/05/2019 05:12 PM   Modules accepted: Orders

## 2019-10-06 MED ORDER — CETIRIZINE HCL 10 MG PO TABS: 10.0000 mg | ORAL_TABLET | Freq: Every evening | ORAL | 3 refills | Status: AC

## 2019-10-06 NOTE — Addendum Note (Signed)
Addended by: Olin Hauser on: 10/06/2019 01:05 PM   Modules accepted: Orders

## 2019-11-02 IMAGING — US US ABDOMEN LIMITED
1 series · 14 of 25 positions shown · non-contrast
Comparison: Right upper quadrant ultrasound dated September 16, 2011.

CLINICAL DATA: Abdominal pain for the past day.

EXAM:
ULTRASOUND ABDOMEN LIMITED RIGHT UPPER QUADRANT

[Series 1: us abdomen limited · 0.22mm/px · 48 acquisitions, 14 frames shown]
[im 1/48]
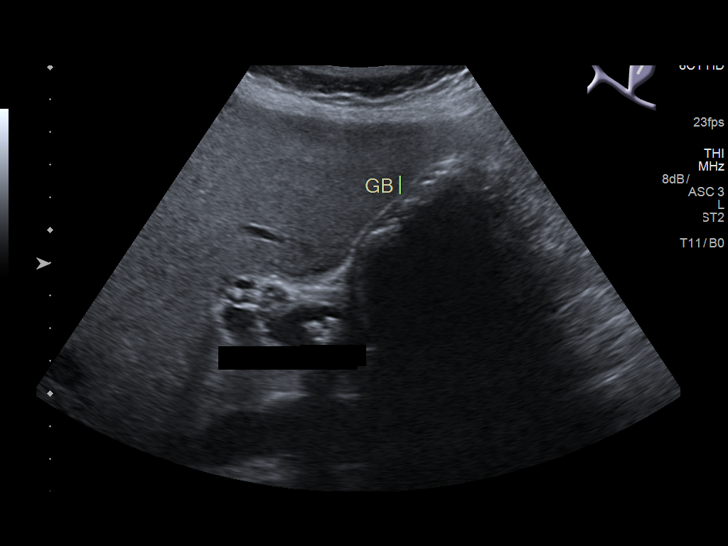
[im 4/48]
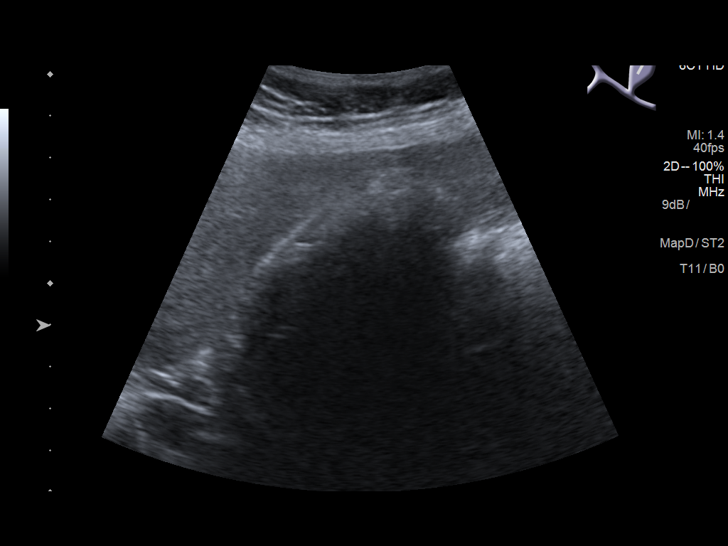
[im 8/48]
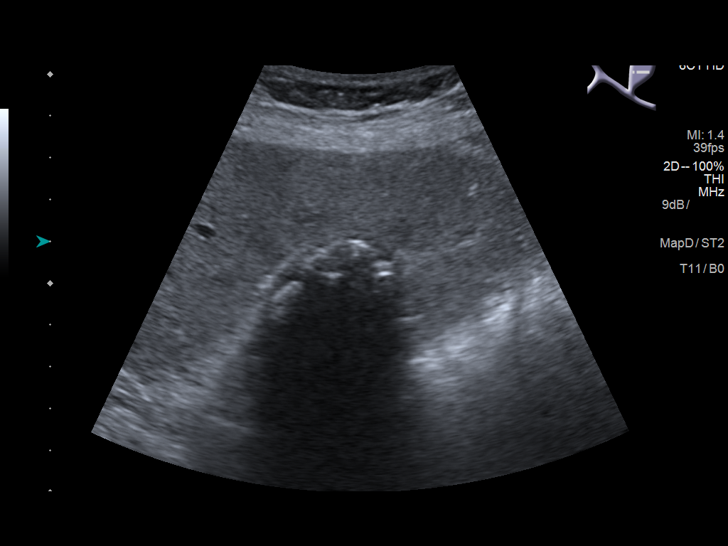
[im 12/48]
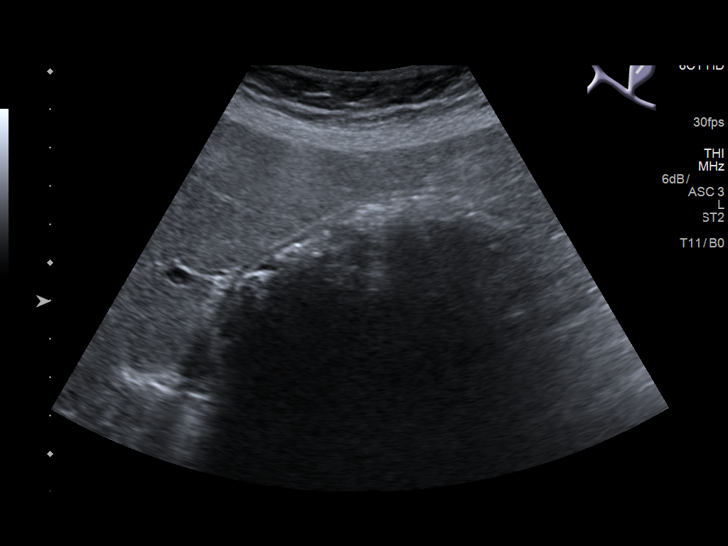
[im 16/48]
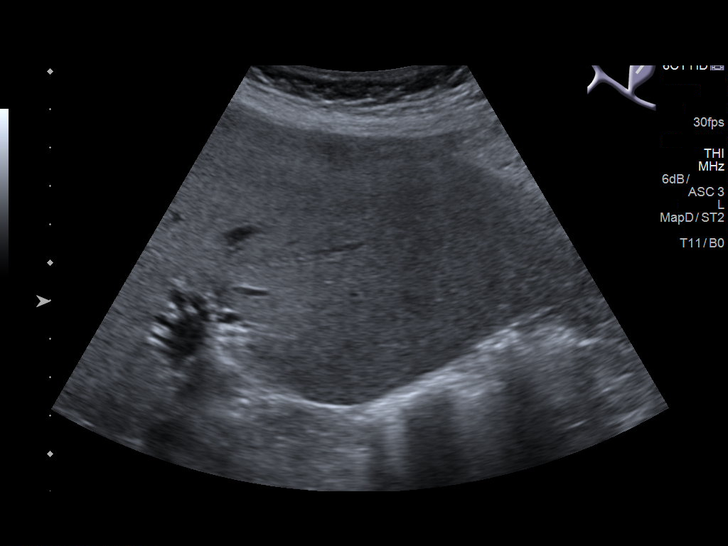
[im 18/48]
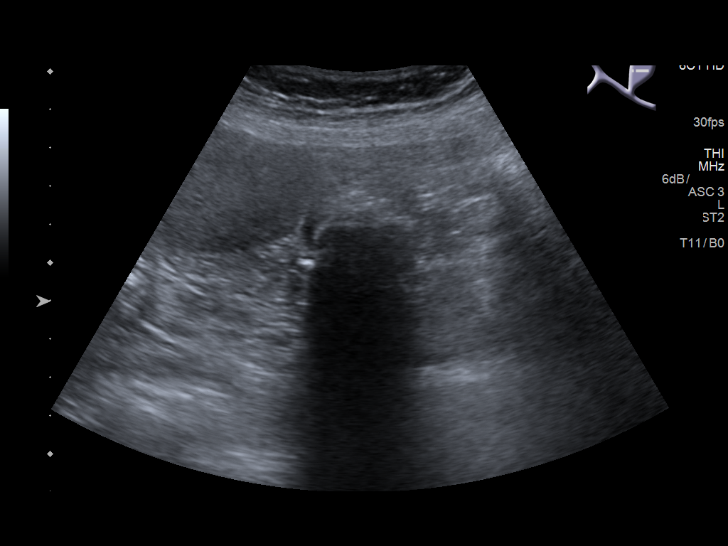
[im 22/48]
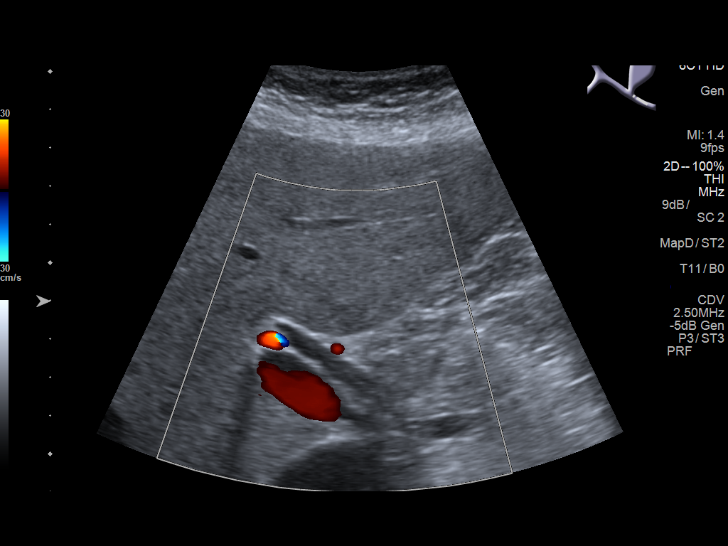
[im 26/48]
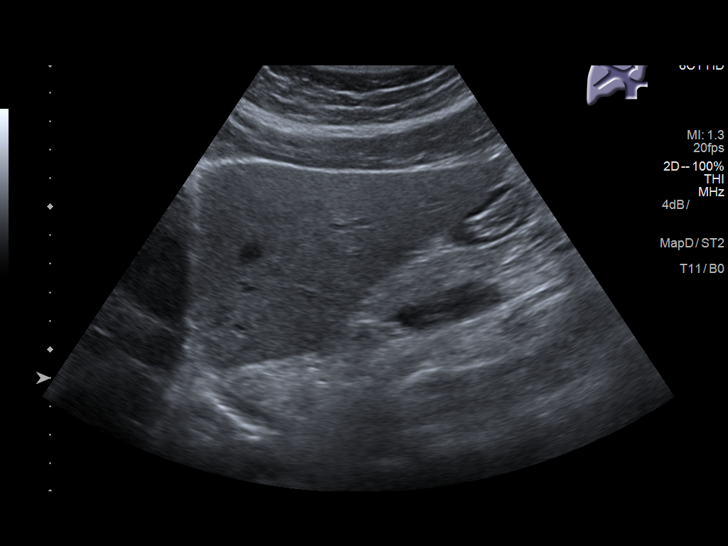
[im 30/48]
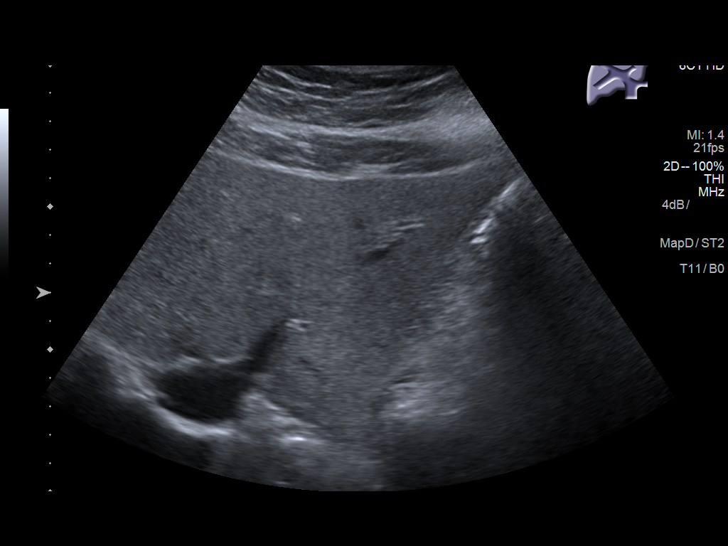
[im 32/48]
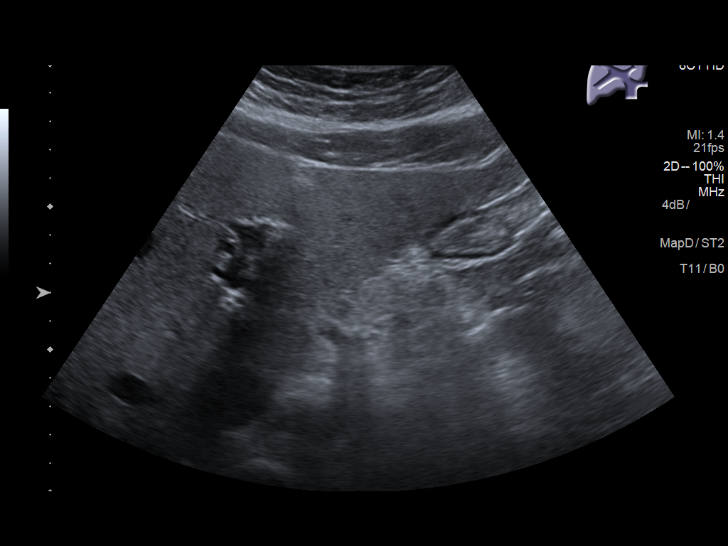
[im 36/48]
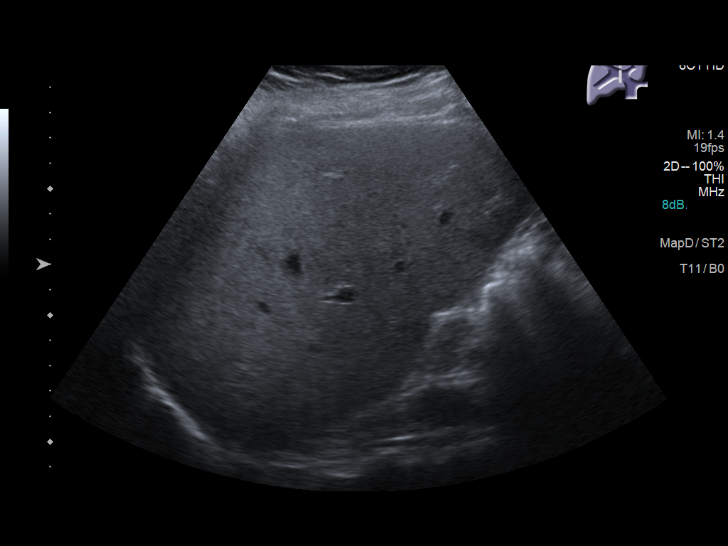
[im 40/48]
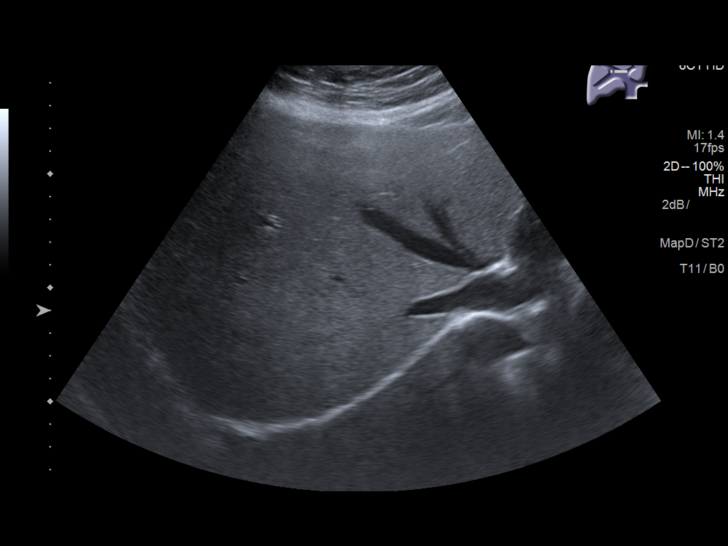
[im 44/48]
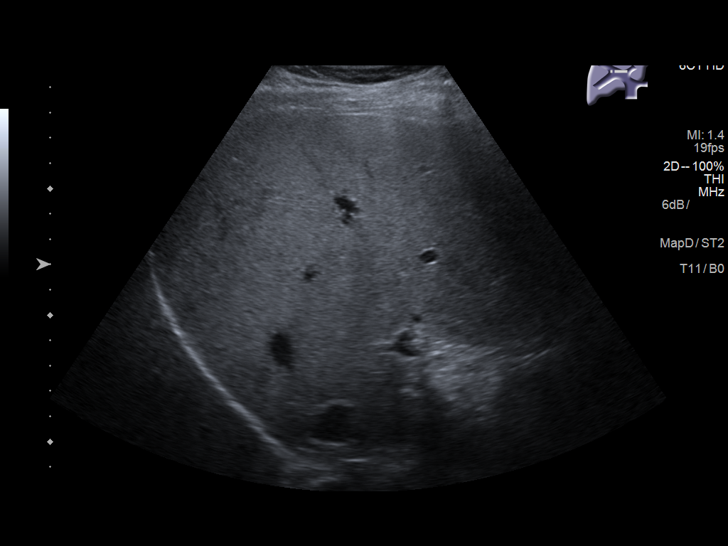
[im 48/48]
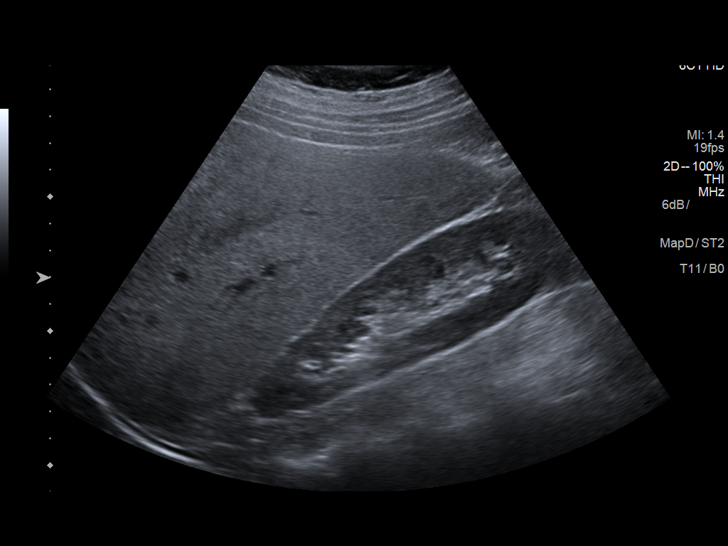

[14 of 25 positions shown; findings below may reference images not displayed]

FINDINGS: Gallbladder:

The gallbladder is filled with gallstones. There is a 1.3 cm
gallstone in the vicinity of the gallbladder neck/cystic duct. No
gallbladder wall thickening or pericholecystic fluid. No sonographic
Murphy sign noted by sonographer.

Common bile duct:

Diameter: 4 mm, normal.

Liver:

No focal lesion identified. Increased in parenchymal echogenicity.
Portal vein is patent on color Doppler imaging with normal direction
of blood flow towards the liver.
IMPRESSION: 1. Cholelithiasis with 1.3 cm gallstone in the vicinity of the
gallbladder neck/cystic duct. No sonographic evidence of acute
cholecystitis.
2. Hepatic steatosis.

## 2020-03-01 ENCOUNTER — Emergency Department
Admission: EM | Admit: 2020-03-01 | Discharge: 2020-03-01 | Disposition: A | Discharge status: Home / Self Care | Payer: Commercial Managed Care - PPO | Attending: Student in an Organized Health Care Education/Training Program | Admitting: Student in an Organized Health Care Education/Training Program

## 2020-03-01 ENCOUNTER — Emergency Department: Payer: Commercial Managed Care - PPO

## 2020-03-01 ENCOUNTER — Other Ambulatory Visit: Payer: Self-pay

## 2020-03-01 DIAGNOSIS — Z79899 Other long term (current) drug therapy: Secondary | ICD-10-CM | POA: Insufficient documentation

## 2020-03-01 DIAGNOSIS — Y92410 Unspecified street and highway as the place of occurrence of the external cause: Secondary | ICD-10-CM | POA: Diagnosis not present

## 2020-03-01 DIAGNOSIS — J45909 Unspecified asthma, uncomplicated: Secondary | ICD-10-CM | POA: Diagnosis not present

## 2020-03-01 DIAGNOSIS — S199XXA Unspecified injury of neck, initial encounter: Secondary | ICD-10-CM | POA: Diagnosis present

## 2020-03-01 DIAGNOSIS — Y9389 Activity, other specified: Secondary | ICD-10-CM | POA: Diagnosis not present

## 2020-03-01 DIAGNOSIS — Z87891 Personal history of nicotine dependence: Secondary | ICD-10-CM | POA: Insufficient documentation

## 2020-03-01 DIAGNOSIS — Y998 Other external cause status: Secondary | ICD-10-CM | POA: Insufficient documentation

## 2020-03-01 DIAGNOSIS — S161XXA Strain of muscle, fascia and tendon at neck level, initial encounter: Secondary | ICD-10-CM | POA: Insufficient documentation

## 2020-03-01 DIAGNOSIS — G44319 Acute post-traumatic headache, not intractable: Secondary | ICD-10-CM | POA: Diagnosis not present

## 2020-03-01 MED ORDER — CYCLOBENZAPRINE HCL 10 MG PO TABS: 5.0000 mg | ORAL_TABLET | Freq: Three times a day (TID) | ORAL | 0 refills | Status: DC | PRN

## 2020-03-01 MED ORDER — NAPROXEN 500 MG PO TABS: 500.0000 mg | ORAL_TABLET | Freq: Two times a day (BID) | ORAL | 0 refills | Status: DC

## 2020-03-01 NOTE — Discharge Instructions (Signed)
Your x-ray is normal.  Please take the muscle relaxer and antiinflammatory as prescribed.  Follow up with primary care if not improving over the week.  Return to the ER for symptoms that change or worsen if unable to schedule an appointment.
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
## 2020-04-30 ENCOUNTER — Telehealth: Payer: Self-pay

## 2020-04-30 ENCOUNTER — Ambulatory Visit
Admission: RE | Admit: 2020-04-30 | Disposition: A | Discharge status: Home / Self Care | Payer: Commercial Managed Care - PPO | Source: Ambulatory Visit
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
## 2020-05-01 ENCOUNTER — Encounter: Payer: Self-pay | Admitting: Family Medicine

## 2020-05-01 ENCOUNTER — Other Ambulatory Visit: Payer: Self-pay

## 2020-05-01 ENCOUNTER — Ambulatory Visit (INDEPENDENT_AMBULATORY_CARE_PROVIDER_SITE_OTHER): Payer: Commercial Managed Care - PPO | Admitting: Family Medicine

## 2020-05-01 VITALS — BP 123/72 | HR 81 | Temp 98.3°F | Ht 63.0 in | Wt 200.6 lb

## 2020-05-01 DIAGNOSIS — R519 Headache, unspecified: Secondary | ICD-10-CM

## 2020-05-01 DIAGNOSIS — F329 Major depressive disorder, single episode, unspecified: Secondary | ICD-10-CM

## 2020-05-01 DIAGNOSIS — R630 Anorexia: Secondary | ICD-10-CM | POA: Insufficient documentation

## 2020-05-01 DIAGNOSIS — F419 Anxiety disorder, unspecified: Secondary | ICD-10-CM

## 2020-05-01 DIAGNOSIS — F32A Depression, unspecified: Secondary | ICD-10-CM

## 2020-05-01 DIAGNOSIS — F1010 Alcohol abuse, uncomplicated: Secondary | ICD-10-CM | POA: Diagnosis not present

## 2020-05-01 LAB — POCT URINALYSIS DIPSTICK
Bilirubin, UA: NEGATIVE
Blood, UA: NEGATIVE
Glucose, UA: NEGATIVE
Ketones, UA: NEGATIVE
Leukocytes, UA: NEGATIVE
Nitrite, UA: NEGATIVE
Protein, UA: NEGATIVE
Spec Grav, UA: 1.02 (ref 1.010–1.025)
Urobilinogen, UA: 0.2 E.U./dL
pH, UA: 5 (ref 5.0–8.0)

## 2020-05-01 MED ORDER — NORTRIPTYLINE HCL 10 MG PO CAPS: ORAL_CAPSULE | ORAL | 0 refills | Status: DC

## 2020-05-01 MED ORDER — RIZATRIPTAN BENZOATE 10 MG PO TABS: 10.0000 mg | ORAL_TABLET | ORAL | 0 refills | Status: DC | PRN

## 2020-05-01 MED ORDER — GABAPENTIN 600 MG PO TABS: ORAL_TABLET | ORAL | 0 refills | Status: DC

## 2020-05-01 NOTE — Assessment & Plan Note (Signed)
Likely rebound headaches along with ETOH usage.  Reports taking acetaminophen and/or ibuprofen daily.  Has not been using imitrex for breaking headaches.  Discussed using daily headache preventative such as Notriptyline nightly, which can help with sleep, as well as her anxiety, depression and headache prevention.  Discussed using rizatriptan as a migraine abortive, as needed.  Patient in agreement with this treatment plan.  1. Begin notriptyline 10mg  nightly x 7 days, increasing 10mg  each week until getting to 30mg  nightly 2. Use rizatriptan 10mg , according to directions, as needed for migraines 3. Reduce use of ibuprofen/acetaminophen to 1-2x a week to help prevent rebound headaches 4. RTC in 3 weeks for re-evaluation

## 2020-05-01 NOTE — Assessment & Plan Note (Signed)
Reports history of eating disorder when she was younger.  Is not eating much during the day, and when she forces herself to eat, she has increased nausea and vomiting.  Had met with a psychiatry provider when she was in college, but did not have a therapeutic relationship with them.  Requesting to have psychiatry referral to establish care for this.  Discussed we can also send a referral for dietary/nutrition department once established with psychiatry for assistance with getting in daily nutrition needed  Plan: 1. Referral to psychiatry placed

## 2020-05-01 NOTE — Assessment & Plan Note (Signed)
DPT4-70/RAJ5-18.  Currently taking bupropion XL 300mg  daily and escitalopram 20mg  nightly.  Reviewed medications, with possible changes, and since we are making changes with headache medications, ETOH concerns, and anorexia, discussed deferring changing medications until has established with psychiatric provider.  Denies SI/HI.  Plan: 1. Referral to psychiatry placed 2. Continue bupropion XL 300mg  daily and escitalopram 20mg  daily 3. Mood handout provided for review 4. RTC in 3 weeks

## 2020-05-01 NOTE — Patient Instructions (Addendum)
As we discussed, I have sent in a prescription for Nortriptyline to take 1m daily x 7 days, then 288mdaily x 7 days and then 3010maily x 7 days.  Take this medication before bed.  It can help with preventing daily headaches as well as with anxiety/depression.  I have sent in a prescription for rizatriptan to take 1 tablet, as needed for migraine, can repeat the dosing in 2 hours.  Max 2 tablets per day.  A referral to psychiatry for anorexia, anxiety, depression, has been placed today.  If you have not heard from the specialty office or our referral coordinator within 1 week, please let us [place] and we will follow up with the referral coordinator for an update.  For our Outpatient Alcohol Detox   Plan take the following meds: Gabapentin (take every day to reduce withdrawal symptoms at baseline)   Day 1 - 300 mg every 6 hours Day 2 - 300 mg every 8 hours  Day 3 - 300 mg every 12 hours  Day 4-7 - 300 mg one dose   Vitamins - B1 (Thiamine) and Folic Acid, and B12G26daily dosing - can do a Multivitamin with most of these as well.   If worsening symptoms despite medicine, uncontrolled tremors, fever, chills sweats, confusion, hallucinations, seizure - emergency call 911, may need hospital detox inpatient PSYCHIATRY / [place] referral for substance program if need   [facility] Pcdress: [address] [phone]: ([phone] Internationalvailable walk-in 9am-4pm M-F [address] [phone]: 9amTranquillity-F, walk in available) [phone] Anonymous (AA) [place] ([place] Site: wwwFactoringRate.caswering Service: ([phone] 2744he following recommendations are helpful adjuncts for helping rebalance your mood.  Eat a nourishing diet. Ensure adequate intake of calories, protein, carbs, fat, vitamins, and minerals.  Prioritize whole foods at each meal, including meats, vegetables, fruits, nuts and seeds, etc.   Avoid inflammatory and/or "junk" foods, such as sugar, omega-6 fats, refined grains, chemicals, and preservatives are common in packaged and prepared foods. Minimize or completely avoid these ingredients and stick to whole foods with little to no additives. Cook from scratch as much as possible for more control over what you eat  Get enough sleep. Poor sleep is significantly associated with depression and anxiety. Make 7-9 hours of sleep nightly a top priority  Exercise appropriately. Exercise is known to improve brain functioning and boost mood. Aim for 30 minutes of daily physical activity. Avoid "overtraining," which can cause mental disturbances  Assess your light exposure. Not enough natural light during the day and too much artificial light can have a major impact on your mood. Get outside as often as possible during daylight hours. Minimize light exposure after dark and avoid the use of electronics that give off blue light before bed  Manage your stress.  Use daily stress management techniques such as meditation, yoga, or mindfulness to retrain your brain to respond differently to stress. Try deep breathing to deactivate your "fight or flight" response.  There are many of sources with apps like Headspace, Calm or a variety of YouTube videos (videos from JasGwynne Edingerve guided meditation)  Prioritize your social life. Work on building social support with new friends or improve current relationships. Consider getting a pet that allows for companionship, social interaction, and physical touch. Try volunteering  or joining a faith-based community to increase your sense of purpose  4-7-8 breathing technique at bedtime: breathe in to count of 4, hold breath for count of 7, exhale for count of 8; do 3-5 times for letting go of overactive thoughts  Take time to play Unstructured "play" time can help  reduce anxiety and depression Options for play include music, games, sports, dance, art, etc.  Try to add daily omega 3 fatty acids, magnesium, B complex, and balanced amino acid supplements to help improve mood and anxiety.  Sleep hygiene is the single most effective treatment for sleep issues, but it is hard work.  Tips for a good night's sleep:  -Keep sleep environment comfortable and conducive to sleep -Keep regular sleep schedule 7 nights a week -Avoiding naps during the day -Avoiding going to bed until drowsy and ready to sleep, not trying to sleep, and not watching the clock -Get out of bed if not asleep within 15-20 minutes and returning only when drowsy -Avoiding caffeine, nicotine, alcohol, and other substances that interfere with sleep before bedtime -Take an hour before your set bedtime and start to wind down: bath/shower, no more TV or phone (the blue light can interfere with sleeping), listen to soothing music, or meditation -No TV in your bedroom -Exercising regularly, at least 6 hours before sleep. Yoga and Tai Chi can improve sleep quality  There are a lot of books and apps that may help guide you with any of the following:   -Progressive muscle relaxation (involves methodical tension and relaxation of different Muscle groups throughout body)  Guided imagery  -YouTube - Gwynne Edinger has free videos on YouTube that can help with meditation and some   Abdominal breathing   Over the counter sleep aid one hour before bed- and gradually wean your use over 2-4 weeks  Some examples are : *Melatonin 5-10 mg *Sleepology (Can find on [employer]) taken according to packaging directions  There are a few online evidence based online programs, unfortunately they are not free.   Developed by a sleep expert who created a drug-free program for insomnia proven more effective than sleeping pills.  www.cbtforinsomnia.com Sleepio is an evidence-based digital sleep improvement  program   www.sleepio.com SHUTi is designed to actively help retrain your body and mind for great sleep through six engaging Cognitive Behavioral Therapy for Insomnia strategy and learning sessions  [email or web address]  We will plan to see you back in 3 weeks for headache re-evaluation, anxiety and depression follow up visit  You will receive a survey after today's visit either digitally by e-mail or paper by [place] mail. Your experiences and feedback matter to [place].  Please respond so we know how we are doing as we provide care for you.  Call us with any questions/concerns/needs.  It is my goal to be available to you for your health concerns.  Thanks for choosing me to be a partner in your healthcare needs!  Harlin Rain, FNP-C Family Nurse Practitioner [facility] Phone: [phone]

## 2020-05-01 NOTE — Assessment & Plan Note (Signed)
Reports drinking liquor 2-3x per day, mixed in with a soda beverage.  States has taken all of the alcohol out of her home, has previously done this and did have withdrawal symptoms.  Discussed outpatient detox with gabapentin over 1 week.  Patient interested in this.  Reviewed instructions and how to take medications.  Plan: 1. Begin outpatient detox starting today 2. Referral placed for psychiatry and substance abuse resources that are local within the community provided to patient 3. RTC in 3 weeks, or sooner, should the need arise

## 2020-05-01 NOTE — Progress Notes (Signed)
Subjective:    Patient ID: Sonya Spencer, female    DOB: 05-11-1986, 34 y.o.   MRN: [id number]  Sonya Spencer is a 34 y.o. female presenting on 05/01/2020 for Headache (chronic migraine), Anorexia (x 2 mths. pt state when she force herself to eat she gets nauseated ), Anxiety, and Alcohol Problem (pt would like to discuss her alcholol usage. Shes currently drinking about 2-3 beverages a day. )   HPI  [name] presents to clinic for concerns of chronic migraines, anorexia, anxiety and ETOH abuse.  Reports she is taking acetaminophen and/or ibuprofen daily to help with headaches, finds she is drinking around 120 ounces of water daily.  Has been having issues with eating, finds herself nauseated a lot, forces herself to eat and then is vomiting.  Reports history of eating disorder when she was younger, that has not received assistance with in the past.  Has an increase in her anxiety/depression with the headaches and anorexia.  Is currently drinking 2-3 beverages a day with liquor mixed in.  Has previously had ETOH detox with withdrawal symptoms.  Is interested in outpatient detox.  Depression screen Sonya Spencer 2/9 05/01/2020 03/08/2019 12/28/2017  Decreased Interest 3 1 1   Down, Depressed, Hopeless 3 1 0  PHQ - 2 Score 6 2 1   Altered sleeping 3 0 -  Tired, decreased energy 3 2 -  Change in appetite 3 1 -  Feeling bad or failure about yourself  3 0 -  Trouble concentrating 3 3 -  Moving slowly or fidgety/restless 0 0 -  Suicidal thoughts 3 0 -  PHQ-9 Score 24 8 -  Difficult doing work/chores Very difficult Not difficult at all -    Social History   Tobacco Use  . Smoking status: Former Smoker    Packs/day: 0.25    Years: 2.00    Pack years: 0.50    Types: Cigarettes    Quit date: 03/13/2009    Years since quitting: 11.1  . Smokeless tobacco: Never Used  Vaping Use  . Vaping Use: Never used  Substance Use Topics  . Alcohol use: Yes    Alcohol/week: 3.0 standard drinks    Types: 3  Shots of liquor per week    Comment: x 3 daily   . Drug use: No    Review of Systems  Constitutional: Negative.   HENT: Negative.   Eyes: Negative.   Respiratory: Negative.   Cardiovascular: Negative.   Gastrointestinal: Positive for nausea and vomiting. Negative for abdominal distention, abdominal pain, anal bleeding, blood in stool, constipation, diarrhea and rectal pain.  Endocrine: Negative.   Genitourinary: Negative.   Musculoskeletal: Negative.   Skin: Negative.   Allergic/Immunologic: Negative.   Neurological: Positive for headaches.  Hematological: Negative.   Psychiatric/Behavioral: Positive for dysphoric mood and sleep disturbance. Negative for agitation, behavioral problems, confusion, decreased concentration, hallucinations, self-injury and suicidal ideas. The patient is nervous/anxious. The patient is not hyperactive.    Per HPI unless specifically indicated above     Objective:    BP 123/72 (BP Location: Left Arm, Patient Position: Sitting, Cuff Size: Normal)   Pulse 81   Temp 98.3 F (36.8 C) (Oral)   Ht 5' 3"  (1.6 m)   Wt 200 lb 9.6 oz (91 kg)   LMP 04/17/2020   BMI 35.53 kg/m   Wt Readings from Last 3 Encounters:  05/01/20 200 lb 9.6 oz (91 kg)  03/01/20 205 lb (93 kg)  10/03/19 202 lb (91.6 kg)  Physical Exam Vitals reviewed.  Constitutional:      General: She is not in acute distress.    Appearance: Normal appearance. She is well-developed and well-groomed. She is obese. She is not ill-appearing or toxic-appearing.  HENT:     Head: Normocephalic and atraumatic.     Nose:     Comments: Sonya Spencer is in place, covering mouth and nose. Eyes:     General: Lids are normal. Vision grossly intact.        Right eye: No discharge.        Left eye: No discharge.     Extraocular Movements: Extraocular movements intact.     Conjunctiva/sclera: Conjunctivae normal.     Pupils: Pupils are equal, round, and reactive to light.  Cardiovascular:     Rate and  Rhythm: Normal rate and regular rhythm.     Pulses: Normal pulses.          Dorsalis pedis pulses are 2+ on the right side and 2+ on the left side.     Heart sounds: Normal heart sounds. No murmur heard.  No friction rub. No gallop.   Pulmonary:     Effort: Pulmonary effort is normal. No respiratory distress.     Breath sounds: Normal breath sounds.  Musculoskeletal:     Right lower leg: No edema.     Left lower leg: No edema.  Skin:    General: Skin is warm and dry.     Capillary Refill: Capillary refill takes less than 2 seconds.  Neurological:     General: No focal deficit present.     Mental Status: She is alert and oriented to person, place, and time.     Cranial Nerves: No cranial nerve deficit.     Sensory: No sensory deficit.     Motor: No weakness.     Gait: Gait normal.     Deep Tendon Reflexes: Reflexes normal.  Psychiatric:        Attention and Perception: Attention and perception normal.        Mood and Affect: Mood and affect normal.        Speech: Speech normal.        Behavior: Behavior normal. Behavior is cooperative.        Thought Content: Thought content normal.        Cognition and Memory: Cognition and memory normal.        Judgment: Judgment normal.    Results for orders placed or performed in visit on 05/01/20  POCT Urinalysis Dipstick  Result Value Ref Range   Color, UA Yellow    Clarity, UA clear    Glucose, UA Negative Negative   Bilirubin, UA negative    Ketones, UA negative    Spec Grav, UA 1.020 1.010 - 1.025   Blood, UA negative    pH, UA 5.0 5.0 - 8.0   Protein, UA Negative Negative   Urobilinogen, UA 0.2 0.2 or 1.0 E.U./dL   Nitrite, UA negative    Leukocytes, UA Negative Negative   Appearance     Odor        Assessment & Plan:   Problem List Items Addressed This Visit      Other   Anxiety and depression    PHQ9-24/GAD7-14.  Currently taking bupropion XL 325m daily and escitalopram 264mnightly.  Reviewed medications, with  possible changes, and since we are making changes with headache medications, ETOH concerns, and anorexia, discussed deferring changing medications until has established with  psychiatric provider.  Denies SI/HI.  Plan: 1. Referral to psychiatry placed 2. Continue bupropion XL 345m daily and escitalopram 215mdaily 3. Mood handout provided for review 4. RTC in 3 weeks      Relevant Medications   nortriptyline (PAMELOR) 10 MG capsule   Other Relevant Orders   POCT Urinalysis Dipstick (Completed)   Ambulatory referral to Psychiatry   Frequent headaches - Primary    Likely rebound headaches along with ETOH usage.  Reports taking acetaminophen and/or ibuprofen daily.  Has not been using imitrex for breaking headaches.  Discussed using daily headache preventative such as Notriptyline nightly, which can help with sleep, as well as her anxiety, depression and headache prevention.  Discussed using rizatriptan as a migraine abortive, as needed.  Patient in agreement with this treatment plan.  1. Begin notriptyline 1076mightly x 7 days, increasing 13m13mch week until getting to 30mg29mhtly 2. Use rizatriptan 13mg,57mording to directions, as needed for migraines 3. Reduce use of ibuprofen/acetaminophen to 1-2x a week to help prevent rebound headaches 4. RTC in 3 weeks for re-evaluation      Relevant Medications   gabapentin (NEURONTIN) 600 MG tablet   nortriptyline (PAMELOR) 10 MG capsule   rizatriptan (MAXALT) 10 MG tablet   Other Relevant Orders   POCT Urinalysis Dipstick (Completed)   Anorexia    Reports history of eating disorder when she was younger.  Is not eating much during the day, and when she forces herself to eat, she has increased nausea and vomiting.  Had met with a psychiatry provider when she was in college, but did not have a therapeutic relationship with them.  Requesting to have psychiatry referral to establish care for this.  Discussed we can also send a referral for  dietary/nutrition department once established with psychiatry for assistance with getting in daily nutrition needed  Plan: 1. Referral to psychiatry placed      Relevant Orders   POCT Urinalysis Dipstick (Completed)   Ambulatory referral to Psychiatry   ETOH abuse    Reports drinking liquor 2-3x per day, mixed in with a soda beverage.  States has taken all of the alcohol out of her home, has previously done this and did have withdrawal symptoms.  Discussed outpatient detox with gabapentin over 1 week.  Patient interested in this.  Reviewed instructions and how to take medications.  Plan: 1. Begin outpatient detox starting today 2. Referral placed for psychiatry and substance abuse resources that are local within the community provided to patient 3. RTC in 3 weeks, or sooner, should the need arise      Relevant Medications   gabapentin (NEURONTIN) 600 MG tablet   Other Relevant Orders   POCT Urinalysis Dipstick (Completed)      Meds ordered this encounter  Medications  . gabapentin (NEURONTIN) 600 MG tablet    Sig: For alcohol detox - all doses are 300mg (47m tab per dose) - Day 1 take half tab every 6 hours, Day 2 take every 8 hours, Day 3 take every 12 hours, Day 4-7 take 1 daily    Dispense:  10 tablet    Refill:  0  . nortriptyline (PAMELOR) 10 MG capsule    Sig: Take 1 capsule (10 mg total) by mouth at bedtime for 7 days, THEN 2 capsules (20 mg total) at bedtime for 7 days, THEN 3 capsules (30 mg total) at bedtime for 14 days.    Dispense:  63 capsule    Refill:  0  . rizatriptan (MAXALT) 10 MG tablet    Sig: Take 1 tablet (10 mg total) by mouth as needed for migraine. May repeat in 2 hours if needed    Dispense:  10 tablet    Refill:  0      Follow up plan: Return in about 3 weeks (around 05/22/2020) for Headache, anxiety, depression f/u.   Harlin Rain, [place] Family Nurse Practitioner [facility] 05/01/2020, 10:12  AM

## 2020-05-08 DIAGNOSIS — F332 Major depressive disorder, recurrent severe without psychotic features: Secondary | ICD-10-CM | POA: Insufficient documentation

## 2020-05-08 DIAGNOSIS — F17210 Nicotine dependence, cigarettes, uncomplicated: Secondary | ICD-10-CM | POA: Insufficient documentation

## 2020-05-08 DIAGNOSIS — Z20822 Contact with and (suspected) exposure to covid-19: Secondary | ICD-10-CM | POA: Diagnosis not present

## 2020-05-08 DIAGNOSIS — R45851 Suicidal ideations: Secondary | ICD-10-CM | POA: Insufficient documentation

## 2020-05-08 DIAGNOSIS — J45909 Unspecified asthma, uncomplicated: Secondary | ICD-10-CM | POA: Diagnosis not present

## 2020-05-09 ENCOUNTER — Encounter: Payer: Self-pay | Admitting: Emergency Medicine

## 2020-05-09 ENCOUNTER — Other Ambulatory Visit: Payer: Self-pay

## 2020-05-09 ENCOUNTER — Emergency Department
Admission: EM | Admit: 2020-05-09 | Discharge: 2020-05-09 | Disposition: A | Discharge status: Other Acute Inpatient Hospital | Payer: Commercial Managed Care - PPO | Attending: Emergency Medicine | Admitting: Emergency Medicine

## 2020-05-09 DIAGNOSIS — F332 Major depressive disorder, recurrent severe without psychotic features: Secondary | ICD-10-CM

## 2020-05-09 DIAGNOSIS — R45851 Suicidal ideations: Secondary | ICD-10-CM

## 2020-05-09 LAB — CBC WITH DIFFERENTIAL/PLATELET
Abs Immature Granulocytes: 0.05 10*3/uL (ref 0.00–0.07)
Basophils Absolute: 0.1 10*3/uL (ref 0.0–0.1)
Basophils Relative: 1 %
Eosinophils Absolute: 0.2 10*3/uL (ref 0.0–0.5)
Eosinophils Relative: 2 %
HCT: 36.4 % (ref 36.0–46.0)
Hemoglobin: 11.9 g/dL — ABNORMAL LOW (ref 12.0–15.0)
Immature Granulocytes: 1 %
Lymphocytes Relative: 34 %
Lymphs Abs: 3.8 10*3/uL (ref 0.7–4.0)
MCH: 27.5 pg (ref 26.0–34.0)
MCHC: 32.7 g/dL (ref 30.0–36.0)
MCV: 84.3 fL (ref 80.0–100.0)
Monocytes Absolute: 0.6 10*3/uL (ref 0.1–1.0)
Monocytes Relative: 5 %
Neutro Abs: 6.3 10*3/uL (ref 1.7–7.7)
Neutrophils Relative %: 57 %
Platelets: 388 10*3/uL (ref 150–400)
RBC: 4.32 MIL/uL (ref 3.87–5.11)
RDW: 13.4 % (ref 11.5–15.5)
WBC: 11 10*3/uL — ABNORMAL HIGH (ref 4.0–10.5)
nRBC: 0 % (ref 0.0–0.2)

## 2020-05-09 LAB — URINE DRUG SCREEN, QUALITATIVE (ARMC ONLY)
Amphetamines, Ur Screen: NOT DETECTED
Barbiturates, Ur Screen: NOT DETECTED
Benzodiazepine, Ur Scrn: NOT DETECTED
Cannabinoid 50 Ng, Ur ~~LOC~~: NOT DETECTED
Cocaine Metabolite,Ur ~~LOC~~: NOT DETECTED
MDMA (Ecstasy)Ur Screen: NOT DETECTED
Methadone Scn, Ur: NOT DETECTED
Opiate, Ur Screen: NOT DETECTED
Phencyclidine (PCP) Ur S: NOT DETECTED
Tricyclic, Ur Screen: POSITIVE — AB

## 2020-05-09 LAB — ACETAMINOPHEN LEVEL: Acetaminophen (Tylenol), Serum: 13 ug/mL (ref 10–30)

## 2020-05-09 LAB — URINALYSIS, COMPLETE (UACMP) WITH MICROSCOPIC
Bilirubin Urine: NEGATIVE
Glucose, UA: NEGATIVE mg/dL
Hgb urine dipstick: NEGATIVE
Ketones, ur: NEGATIVE mg/dL
Leukocytes,Ua: NEGATIVE
Nitrite: NEGATIVE
Protein, ur: NEGATIVE mg/dL
Specific Gravity, Urine: 1.012 (ref 1.005–1.030)
pH: 6 (ref 5.0–8.0)

## 2020-05-09 LAB — COMPREHENSIVE METABOLIC PANEL
ALT: 23 U/L (ref 0–44)
AST: 38 U/L (ref 15–41)
Albumin: 4.7 g/dL (ref 3.5–5.0)
Alkaline Phosphatase: 66 U/L (ref 38–126)
Anion gap: 10 (ref 5–15)
BUN: 14 mg/dL (ref 6–20)
CO2: 24 mmol/L (ref 22–32)
Calcium: 9.3 mg/dL (ref 8.9–10.3)
Chloride: 103 mmol/L (ref 98–111)
Creatinine, Ser: 0.89 mg/dL (ref 0.44–1.00)
GFR calc Af Amer: >60 mL/min (ref >60)
GFR calc non Af Amer: >60 mL/min (ref >60)
Glucose, Bld: 104 mg/dL — ABNORMAL HIGH (ref 70–99)
Potassium: 3.9 mmol/L (ref 3.5–5.1)
Sodium: 137 mmol/L (ref 135–145)
Total Bilirubin: 0.5 mg/dL (ref 0.3–1.2)
Total Protein: 7.9 g/dL (ref 6.5–8.1)

## 2020-05-09 LAB — POCT PREGNANCY, URINE: Preg Test, Ur: NEGATIVE

## 2020-05-09 LAB — ETHANOL: Alcohol, Ethyl (B): <10 mg/dL (ref <10)

## 2020-05-09 LAB — SALICYLATE LEVEL: Salicylate Lvl: <7 mg/dL — ABNORMAL LOW (ref 7.0–30.0)

## 2020-05-09 LAB — SARS CORONAVIRUS 2 BY RT PCR (HOSPITAL ORDER, PERFORMED IN ~~LOC~~ HOSPITAL LAB): SARS Coronavirus 2: NEGATIVE

## 2020-05-09 NOTE — BH Assessment (Signed)
Referral information for Psychiatric Hospitalization faxed to;   [facility] [phone]),   . [facility] [phone]),   . [facility] [phone] -or- [phone]),   . [facility] [phone])

## 2020-05-09 NOTE — BH Assessment (Signed)
Assessment Note  Sonya Spencer is an 34 y.o. female presenting to [facility] ED voluntarily for SI. Per triage note pt reports SI with plan; st hx of attempt; pt st that she wants no information disclosed to anyone who may inquire over her whereabouts. During assessment patient appears alert and oriented x4, calm and cooperative, appears depressed and sad with low mood. When asked why patient was presenting to ED patient reported "I just got tired of my life and I wanted to end it so I came here." "I've been feeling like this for a couple of weeks, I take medications for Postpartum depression, I get it from my primary care doctor." Patient reports having 4 children with the youngest being 67 years old and living with her husband. Patient reports SI with a plan "to take a bunch of my pills." Patient denies any attempts in the past but reports a history of cutting "I cut to relieve my pain" patient showed clinician several old cut marks on both of her arms and reports the last time she cut "yesterday." Patient also reports lack of sleep, lack of appetite and no motivation to do anything or engage in any activity. Patient reports a history of alcohol use and reports "I used to drink every night but now I don't." Patient reported last drink being "last night I had 1 glass of cider." Patient reports that although she has felt depressed a couple of weeks she reports a history of depression for years and reports never being hospitalized for it. Patient continues to report SI, denies HI/AH/VH and does not appear to be responding to any internal or external stimuli.  Per Psyc NP Ysidro Evert patient is recommended for Inpatient Hospitalization.  Diagnosis: Major Depressive Disorder, severe  Past Medical History:  Past Medical History:  Diagnosis Date   Abnormal glucose tolerance test (GTT) during pregnancy, antepartum 12/23/2017   - pt check BG log.   - Lifestyles referral for presumptive GDM (also due to  inordinate weight loss early in pregnancy) - will treat as GDM.  Patient has refused 3 hour gtt.     Allergy    Anemia    with pregnancy only   Anxiety    Asthma    only with pregnancy   Cholelithiasis    Complication of anesthesia    panic attack with first panic   Depression    Fetal macrosomia during pregnancy in third trimester 03/25/2015   Resolved with delivery    Frequent headaches    migraines with pregnancy   GERD (gastroesophageal reflux disease)    Gestational diabetes    Gestational diabetes mellitus (GDM) affecting third pregnancy 02/05/2018   H/O maternal third degree perineal laceration, currently pregnant    History of Papanicolaou smear of cervix 10/10/11; 08/02/14   neg, ct neg; neg ct//gc/tr neg   PONV (postoperative nausea and vomiting)    Supervision of normal pregnancy 11/09/2017   Clinic [facility] Prenatal Labs Dating  Blood type:    Genetic Screen 1 Screen:    AFP:     Quad:     NIPS: Antibody:  Anatomic [place]  Rubella:   Varicella:   GTT Early:               Third trimester:  RPR:    Rhogam  HBsAg:    TDaP vaccine                       Flu Shot: HIV:  Baby Food                                GBS:  Contraception  Pap: CBB    CS/VBAC   Support Person         Urinary incontinence     Past Surgical History:  Procedure Laterality Date   CESAREAN SECTION N/A 03/22/2015   Procedure: CESAREAN SECTION;  Surgeon: Will Bonnet, MD;  Location: [facility] ORS;  Service: Obstetrics;  Laterality: N/A;   CESAREAN SECTION WITH BILATERAL TUBAL LIGATION N/A 06/24/2018   Procedure: CESAREAN SECTION WITH BILATERAL TUBAL LIGATION;  Surgeon: Will Bonnet, MD;  Location: [facility] ORS;  Service: Obstetrics;  Laterality: N/A;   CHOLECYSTECTOMY N/A 10/11/2018   Procedure: LAPAROSCOPIC CHOLECYSTECTOMY;  Surgeon: Vickie Epley, MD;  Location: [facility] ORS;  Service: General;  Laterality: N/A;   FOREIGN BODY REMOVAL      Family History:  Family History  Problem  Relation Age of Onset   Lupus Mother    Hepatitis C Mother    Depression Mother    Diabetes type I Father    Diabetes Father        type 1   Thyroid disease Father    Lupus Sister    Lupus Maternal Grandmother    Diabetes Maternal Grandmother    Diabetes type I Daughter    Diabetes Daughter    Lupus Maternal Aunt    Diabetes Paternal Grandfather        type 1   Diabetes Paternal Aunt        type 1 - runs in father's side    Social History:  reports that she quit smoking about 11 years ago. Her smoking use included cigarettes. She has a 0.50 pack-year smoking history. She has never used smokeless tobacco. She reports current alcohol use of about 3.0 standard drinks of alcohol per week. She reports that she does not use drugs.  Additional Social History:  Alcohol / Drug Use Pain Medications: See MAR Prescriptions: See MAR Over the Counter: See MAR History of alcohol / drug use?: Yes Substance #1 Name of Substance 1: Alcohol  CIWA: CIWA-Ar BP: (!) 138/101 Pulse Rate: 94 COWS:    Allergies:  Allergies  Allergen Reactions   Nickel Rash   Red Dye Rash    Includes Benadryl as it has red dye in it.    Home Medications: (Not in a hospital admission)   OB/GYN Status:  Patient's last menstrual period was 04/17/2020 (exact date).  General Assessment Data Location of Assessment: [facility] ED TTS Assessment: In system Is this a Tele or Face-to-Face Assessment?: Face-to-Face Is this an Initial Assessment or a Re-assessment for this encounter?: Initial Assessment Patient Accompanied by:: N/A Language Other than English: No Living Arrangements: Other (Comment) (Private Residence) What gender do you identify as?: Female Marital status: Married Pregnancy Status: No Living Arrangements: Spouse/significant other, Children Can pt return to current living arrangement?: Yes Admission Status: Voluntary Is patient capable of signing voluntary admission?: Yes Referral  Source: Self/Family/Friend Insurance type: Facilities manager Exam ([place]) Medical Exam completed: Yes  Crisis Care Plan Living Arrangements: Spouse/significant other, Children Legal Guardian: Other: (Self) Name of Psychiatrist: None Name of Therapist: None  Education Status Is patient currently in school?: No Is the patient employed, unemployed or receiving disability?: Unemployed  Risk to self with the past 6 months Suicidal Ideation: Yes-Currently Present Has patient  been a risk to self within the past 6 months prior to admission? : Yes Suicidal Intent: Yes-Currently Present Has patient had any suicidal intent within the past 6 months prior to admission? : Yes Is patient at risk for suicide?: Yes Suicidal Plan?: Yes-Currently Present Has patient had any suicidal plan within the past 6 months prior to admission? : Yes Specify Current Suicidal Plan: "To overdose on my medication" Access to Means: Yes Specify Access to Suicidal Means: Patient has access to her medication What has been your use of drugs/alcohol within the last 12 months?: Alcohol use Previous Attempts/Gestures: No How many times?: 0 Other Self Harm Risks: Cutting Triggers for Past Attempts: None known Intentional Self Injurious Behavior: Cutting Comment - Self Injurious Behavior: Patient has multiple cuts on both arms Family Suicide History: No Recent stressful life event(s): Other (Comment) (Post partum depression) Persecutory voices/beliefs?: No Depression: Yes Depression Symptoms: Isolating, Loss of interest in usual pleasures, Feeling worthless/self pity, Fatigue Substance abuse history and/or treatment for substance abuse?: Yes Suicide prevention information given to non-admitted patients: Not applicable  Risk to Others within the past 6 months Homicidal Ideation: No Does patient have any lifetime risk of violence toward others beyond the six months prior to admission? :  No Thoughts of Harm to Others: No Current Homicidal Intent: No Current Homicidal Plan: No Access to Homicidal Means: No Identified Victim: None History of harm to others?: No Assessment of Violence: None Noted Violent Behavior Description: None Does patient have access to weapons?: No Criminal Charges Pending?: No Does patient have a court date: No Is patient on probation?: No  Psychosis Hallucinations: None noted Delusions: None noted  Mental Status Report Appearance/Hygiene: In scrubs Eye Contact: Good Motor Activity: Freedom of movement Speech: Logical/coherent Level of Consciousness: Alert Mood: Depressed, Sad Affect: Depressed, Sad Anxiety Level: None Thought Processes: Coherent Judgement: Unimpaired Orientation: Person, Place, Time, Situation, Appropriate for developmental age Obsessive Compulsive Thoughts/Behaviors: None  Cognitive Functioning Concentration: Normal Memory: Recent Intact, Remote Intact Is patient IDD: No Insight: Fair Impulse Control: Fair Appetite: Poor Have you had any weight changes? : No Change Sleep: Decreased Total Hours of Sleep: 0 Vegetative Symptoms: None  ADLScreening [facility] Assessment Services) Patient's cognitive ability adequate to safely complete daily activities?: Yes Patient able to express need for assistance with ADLs?: Yes Independently performs ADLs?: Yes (appropriate for developmental age)  Prior Inpatient Therapy Prior Inpatient Therapy: No  Prior Outpatient Therapy Prior Outpatient Therapy: No Does patient have an ACCT team?: No Does patient have Intensive In-House Services?  : No Does patient have [facility] services? : No Does patient have P4CC services?: No  ADL Screening (condition at time of admission) Patient's cognitive ability adequate to safely complete daily activities?: Yes Is the patient deaf or have difficulty hearing?: No Does the patient have difficulty seeing, even when wearing glasses/contacts?:  No Does the patient have difficulty concentrating, remembering, or making decisions?: No Patient able to express need for assistance with ADLs?: Yes Does the patient have difficulty dressing or bathing?: No Independently performs ADLs?: Yes (appropriate for developmental age) Does the patient have difficulty walking or climbing stairs?: No Weakness of Legs: None Weakness of Arms/Hands: None  Home Assistive Devices/Equipment Home Assistive Devices/Equipment: None  Therapy Consults (therapy consults require a physician order) PT Evaluation Needed: No OT Evalulation Needed: No SLP Evaluation Needed: No Abuse/Neglect Assessment (Assessment to be complete while patient is alone) Abuse/Neglect Assessment Can Be Completed: Yes Physical Abuse: Denies Verbal Abuse: Denies Sexual Abuse: Denies Exploitation  of patient/patient's resources: Denies Self-Neglect: Denies Values / Beliefs Cultural Requests During Hospitalization: None Spiritual Requests During Hospitalization: None Consults Spiritual Care Consult Needed: No Transition of Care Team Consult Needed: No Advance Directives (For Healthcare) Does Patient Have a Medical Advance Directive?: No Would patient like information on creating a medical advance directive?: No - Patient declined          Disposition: Per Psyc NP Ysidro Evert patient is recommended for Inpatient Hospitalization. Disposition Initial Assessment Completed for this Encounter: Yes  On Site Evaluation by:   Reviewed with Physician:    Leonie Douglas MS [place] 05/09/2020 3:40 AM

## 2020-05-09 NOTE — ED Provider Notes (Signed)
Patient examined at this time.  Resting comfortably in the hallway.  Alerts easily to voice.  Patient is alert, understanding of plan to transfer to [facility].  She is agreeable with this plan and in no distress.  Stable for transfer.  Vitals:   05/09/20 0119 05/09/20 0437  BP: (!) 138/101 131/82  Pulse: 94   Resp: 18   Temp: 99.6 F (37.6 C)   SpO2: 98%       Delman Kitten, MD 05/09/20 [phone]

## 2020-05-09 NOTE — ED Notes (Signed)
Pt reports that she came in for feeling like she "would like to stop breathing". Pt denies HI. Pt reports no new life stressors. Reports changes to her daily meds recently but unable to give this nurse specifics or name the medications currently. States her plan earlier was to "take a lot of pills". Pt agrees to notify this RN if she alters plans or changes her mind. Reports times of abdominal discomfort; denies issues with BMs/urination.

## 2020-05-09 NOTE — ED Notes (Signed)
Pt transferred to [place]. Transported by [place] pd. Personal belongings sent with pt. Pt ambulatory and in NAD.

## 2020-05-09 NOTE — ED Triage Notes (Addendum)
Patient ambulatory to triage with steady gait, without difficulty or distress noted; pt reports SI with plan; st hx of attempt; pt st that she wants no information disclosed to anyone who may inquire over her whereabouts; registration notified to please make pt confidential; husband removed as contact person at pt request and replaced with her friend's info

## 2020-05-09 NOTE — ED Provider Notes (Signed)
[facility] Emergency Department Provider Note  ____________________________________________  Time seen: Approximately 3:19 AM  I have reviewed the triage vital signs and the nursing notes.   HISTORY  Chief Complaint Mental Health Problem   HPI Sonya Spencer is a 34 y.o. female with a history of anxiety and depression who presents voluntarily for evaluation of depression.  Patient reports that over the last few weeks she has become profoundly depressed and now is having suicidal thoughts with a plan to overdose.  She denies ever trying to kill herself in the past.  She reports that she drinks alcohol socially but denies any drug use.  She is on Wellbutrin and Lexapro and has been taking it as prescribed.  She denies any recent stressors that led to the worsening of her depression.  She denies any medical complaints.   Past Medical History:  Diagnosis Date   Abnormal glucose tolerance test (GTT) during pregnancy, antepartum 12/23/2017   - pt check BG log.   - Lifestyles referral for presumptive GDM (also due to inordinate weight loss early in pregnancy) - will treat as GDM.  Patient has refused 3 hour gtt.     Allergy    Anemia    with pregnancy only   Anxiety    Asthma    only with pregnancy   Cholelithiasis    Complication of anesthesia    panic attack with first panic   Depression    Fetal macrosomia during pregnancy in third trimester 03/25/2015   Resolved with delivery    Frequent headaches    migraines with pregnancy   GERD (gastroesophageal reflux disease)    Gestational diabetes    Gestational diabetes mellitus (GDM) affecting third pregnancy 02/05/2018   H/O maternal third degree perineal laceration, currently pregnant    History of Papanicolaou smear of cervix 10/10/11; 08/02/14   neg, ct neg; neg ct//gc/tr neg   PONV (postoperative nausea and vomiting)    Supervision of normal pregnancy 11/09/2017   Clinic [facility] Prenatal  Labs Dating  Blood type:    Genetic Screen 1 Screen:    AFP:     Quad:     NIPS: Antibody:  Anatomic [place]  Rubella:   Varicella:   GTT Early:               Third trimester:  RPR:    Rhogam  HBsAg:    TDaP vaccine                       Flu Shot: HIV:    Baby Food                                GBS:  Contraception  Pap: CBB    CS/VBAC   Support Person         Urinary incontinence     Patient Active Problem List   Diagnosis Date Noted   Anorexia 05/01/2020   ETOH abuse 05/01/2020   Chronic cholecystitis    Symptomatic cholelithiasis 10/06/2018   Low-lying placenta 05/12/2018   Fall (on) (from) other stairs and steps, initial encounter 03/05/2018   History of macrosomia in infant in prior pregnancy, currently pregnant 11/09/2017   Nausea and vomiting during pregnancy 11/09/2017   History of cesarean delivery 11/09/2017   Lipoma of axilla 06/22/2017   Anxiety and depression 05/11/2017   Frequent headaches 05/11/2017    Past Surgical  History:  Procedure Laterality Date   CESAREAN SECTION N/A 03/22/2015   Procedure: CESAREAN SECTION;  Surgeon: Will Bonnet, MD;  Location: [facility] ORS;  Service: Obstetrics;  Laterality: N/A;   CESAREAN SECTION WITH BILATERAL TUBAL LIGATION N/A 06/24/2018   Procedure: CESAREAN SECTION WITH BILATERAL TUBAL LIGATION;  Surgeon: Will Bonnet, MD;  Location: [facility] ORS;  Service: Obstetrics;  Laterality: N/A;   CHOLECYSTECTOMY N/A 10/11/2018   Procedure: LAPAROSCOPIC CHOLECYSTECTOMY;  Surgeon: Vickie Epley, MD;  Location: [facility] ORS;  Service: General;  Laterality: N/A;   FOREIGN BODY REMOVAL      Prior to Admission medications   Medication Sig Start Date End Date Taking? Authorizing Provider  albuterol (VENTOLIN HFA) 108 (90 Base) MCG/ACT inhaler TAKE 2 PUFFS BY MOUTH EVERY 6 HOURS AS NEEDED FOR WHEEZE 12/12/18   [provider]  buPROPion (WELLBUTRIN XL) 300 MG 24 hr tablet Take 1 tablet (300 mg total) by mouth at bedtime. 10/03/19    Karamalegos, Devonne Doughty, DO  cetirizine (ZYRTEC) 10 MG tablet Take 1 tablet (10 mg total) by mouth every evening. 10/06/19   Karamalegos, Devonne Doughty, DO  escitalopram (LEXAPRO) 20 MG tablet Take 1 tablet (20 mg total) by mouth at bedtime. 10/03/19   Karamalegos, Devonne Doughty, DO  gabapentin (NEURONTIN) 600 MG tablet For alcohol detox - all doses are 300mg  (half tab per dose) - Day 1 take half tab every 6 hours, Day 2 take every 8 hours, Day 3 take every 12 hours, Day 4-7 take 1 daily 05/01/20   Malfi, Lupita Raider, FNP  nortriptyline (PAMELOR) 10 MG capsule Take 1 capsule (10 mg total) by mouth at bedtime for 7 days, THEN 2 capsules (20 mg total) at bedtime for 7 days, THEN 3 capsules (30 mg total) at bedtime for 14 days. 05/01/20 05/29/20  Verl Bangs, FNP  omeprazole (PRILOSEC) 20 MG capsule TAKE 1 CAPSULE BY MOUTH ONCE DAILY 03/10/19   Parks Ranger, Devonne Doughty, DO  ondansetron (ZOFRAN ODT) 4 MG disintegrating tablet Take 1 tablet (4 mg total) by mouth every 8 (eight) hours as needed. 06/22/19   Marylene Land, NP  rizatriptan (MAXALT) 10 MG tablet Take 1 tablet (10 mg total) by mouth as needed for migraine. May repeat in 2 hours if needed 05/01/20   Malfi, Lupita Raider, FNP    Allergies Nickel and Red dye  Family History  Problem Relation Age of Onset   Lupus Mother    Hepatitis C Mother    Depression Mother    Diabetes type I Father    Diabetes Father        type 1   Thyroid disease Father    Lupus Sister    Lupus Maternal Grandmother    Diabetes Maternal Grandmother    Diabetes type I Daughter    Diabetes Daughter    Lupus Maternal Aunt    Diabetes Paternal Grandfather        type 1   Diabetes Paternal Aunt        type 1 - runs in father's side    Social History Social History   Tobacco Use   Smoking status: Former Smoker    Packs/day: 0.25    Years: 2.00    Pack years: 0.50    Types: Cigarettes    Quit date: 03/13/2009    Years since quitting: 11.1   Smokeless  tobacco: Never Used  Vaping Use   Vaping Use: Never used  Substance Use Topics   Alcohol use: Yes  Alcohol/week: 3.0 standard drinks    Types: 3 Shots of liquor per week    Comment: x 3 daily    Drug use: No    Review of Systems  Constitutional: Negative for fever. Eyes: Negative for visual changes. ENT: Negative for sore throat. Neck: No neck pain  Cardiovascular: Negative for chest pain. Respiratory: Negative for shortness of breath. Gastrointestinal: Negative for abdominal pain, vomiting or diarrhea. Genitourinary: Negative for dysuria. Musculoskeletal: Negative for back pain. Skin: Negative for rash. Neurological: Negative for headaches, weakness or numbness. Psych: No SI or HI. + depression  ____________________________________________   PHYSICAL EXAM:  VITAL SIGNS: ED Triage Vitals [05/09/20 0119]  Enc Vitals Group     BP (!) 138/101     Pulse Rate 94     Resp 18     Temp 99.6 F (37.6 C)     Temp Source Oral     SpO2 98 %     Weight 200 lb (90.7 kg)     Height 5\' 3"  (1.6 m)     Head Circumference      Peak Flow      Pain Score 0     Pain Loc      Pain Edu?      Excl. in [place]?     Constitutional: Alert and oriented. Well appearing and in no apparent distress. HEENT:      Head: Normocephalic and atraumatic.         Eyes: Conjunctivae are normal. Sclera is non-icteric.       Mouth/Throat: Mucous membranes are moist.       Neck: Supple with no signs of meningismus. Cardiovascular: Regular rate and rhythm.  Respiratory: Normal respiratory effort.  Gastrointestinal: Soft, non tender, and non distended. Musculoskeletal: No edema, cyanosis, or erythema of extremities. Neurologic: Normal speech and language. Face is symmetric. Moving all extremities. No gross focal neurologic deficits are appreciated. Skin: Skin is warm, dry and intact. No rash noted. Psychiatric: Mood and affect are normal. Speech and behavior are  normal.  ____________________________________________   LABS (all labs ordered are listed, but only abnormal results are displayed)  Labs Reviewed  CBC WITH DIFFERENTIAL/PLATELET - Abnormal; Notable for the following components:      Result Value   WBC 11.0 (*)    Hemoglobin 11.9 (*)    All other components within normal limits  COMPREHENSIVE METABOLIC PANEL - Abnormal; Notable for the following components:   Glucose, Bld 104 (*)    All other components within normal limits  SALICYLATE LEVEL - Abnormal; Notable for the following components:   Salicylate Lvl <8.8 (*)    All other components within normal limits  URINALYSIS, COMPLETE (UACMP) WITH MICROSCOPIC - Abnormal; Notable for the following components:   Color, Urine STRAW (*)    APPearance HAZY (*)    Bacteria, UA RARE (*)    All other components within normal limits  URINE DRUG SCREEN, QUALITATIVE ([facility] ONLY) - Abnormal; Notable for the following components:   Tricyclic, Ur Screen POSITIVE (*)    All other components within normal limits  SARS CORONAVIRUS 2 BY RT PCR (HOSPITAL ORDER, [place] [facility])  ETHANOL  ACETAMINOPHEN LEVEL  POCT PREGNANCY, URINE  POC URINE PREG, ED   ____________________________________________  EKG  none  ____________________________________________  RADIOLOGY  none  ____________________________________________   PROCEDURES  Procedure(s) performed: None Procedures Critical Care performed:  None ____________________________________________   INITIAL IMPRESSION / ASSESSMENT AND PLAN / ED COURSE  33  y.o. female with a history of anxiety and depression who presents voluntarily for evaluation of depression with suicidal ideation and plan to overdose.  Patient meets criteria for IVC.  Labs for medical clearance with no acute findings.  Patient denies any medical complaints.  Psychiatry consulted.  Old medical records reviewed.  The patient has been placed in  psychiatric observation due to the need to provide a safe environment for the patient while obtaining psychiatric consultation and evaluation, as well as ongoing medical and medication management to treat the patient's condition.  The patient has been placed under full IVC at this time.       Please note:  Patient was evaluated in Emergency Department today for the symptoms described in the history of present illness. Patient was evaluated in the context of the global COVID-19 pandemic, which necessitated consideration that the patient might be at risk for infection with the SARS-CoV-2 virus that causes COVID-19. Institutional protocols and algorithms that pertain to the evaluation of patients at risk for COVID-19 are in a state of rapid change based on information released by regulatory bodies including the CDC and federal and state organizations. These policies and algorithms were followed during the patient's care in the ED.  Some ED evaluations and interventions may be delayed as a result of limited staffing during the pandemic.   ____________________________________________   FINAL CLINICAL IMPRESSION(S) / ED DIAGNOSES   Final diagnoses:  Severe episode of recurrent major depressive disorder, without psychotic features ([place])  Suicidal ideation      NEW MEDICATIONS STARTED DURING THIS VISIT:  ED Discharge Orders    None       Note:  This document was prepared using Dragon voice recognition software and may include unintentional dictation errors.    Alfred Levins, [place], MD 05/09/20 [phone]

## 2020-05-09 NOTE — ED Notes (Signed)
Pt is sitting in chair. Pt is calm and cooperative. Healed cuts to the left arm noted. No active bleeding noted. Pt states she is here for SI and that she did have a plan, but no longer has a plan. Pt is eating crackers and drinking water at this time.

## 2020-05-09 NOTE — ED Notes (Signed)
Pt given drink and snack.

## 2020-05-09 NOTE — BH Assessment (Signed)
PATIENT BED AVAILABLE AFTER 9AM  Patient has been accepted to [facility].  Patient assigned to [facility] Accepting physician is Dr. Jonelle Sports.  Call report to [phone].  Representative was [employer].   ER Staff is aware of it:  [facility] ER Secretary  Dr. Alfred Levins, ER MD  Miquel Dunn Patient's Nurse     Address: [facility] [address] [place] [address]

## 2020-05-09 NOTE — ED Notes (Signed)
Report called in to [place], Therapist, sports.

## 2020-05-09 NOTE — ED Notes (Signed)
Pt removed brown flip flops, blue pants, grey t-shirt, grey sweatshirt, black tank top, wallet, iphone 11 with charger, wallet--all placed in labeled pt belonging bag to be secured on nursing unit and pt changed into blue paper scrubs; pt will keep on glasses

## 2020-05-09 NOTE — ED Notes (Signed)
[place] [employer] notified patient does not want family or friends to know that she is here.

## 2020-05-09 NOTE — ED Provider Notes (Signed)
Patient resting comfortably, pleasant no distress.  Remains agreeable with plan to transfer.  Stable for transfer to [facility].  Transport has now arrived   Delman Kitten, MD 05/09/20 1151

## 2020-05-09 NOTE — ED Notes (Signed)
Pt's husband called wanting update on pt. No update given since pt specified to this RN that she doesn't want anyone updated on her status.

## 2020-05-09 NOTE — ED Notes (Addendum)
TTS asked this RN to Call [facility] and talk with Dalton at [phone]. Jeani Hawking stated pt is to be accepted and is able to come later today. TTS confirmed and stated pt will be able to go after 9am

## 2020-05-22 ENCOUNTER — Ambulatory Visit: Payer: Commercial Managed Care - PPO | Admitting: Family Medicine

## 2020-08-30 ENCOUNTER — Ambulatory Visit
Admission: EM | Admit: 2020-08-30 | Discharge: 2020-08-30 | Disposition: A | Discharge status: Home / Self Care | Payer: Commercial Managed Care - PPO | Attending: Family Medicine | Admitting: Family Medicine

## 2020-08-30 DIAGNOSIS — R220 Localized swelling, mass and lump, head: Secondary | ICD-10-CM

## 2020-08-30 MED ORDER — AMOXICILLIN-POT CLAVULANATE 875-125 MG PO TABS
1.0000 | ORAL_TABLET | Freq: Two times a day (BID) | ORAL | 0 refills | Status: DC
Start: 2020-08-30 — End: 2020-12-14

## 2020-08-30 MED ORDER — AMOXICILLIN-POT CLAVULANATE 875-125 MG PO TABS
1.0000 | ORAL_TABLET | Freq: Two times a day (BID) | ORAL | 0 refills | Status: DC
Start: 2020-08-30 — End: 2020-08-30

## 2020-08-30 NOTE — ED Triage Notes (Signed)
Patient presents to Urgent Care with complaints of facial burning Tuesday from using curling iron. Last night patient reports right eye swelling noted yesterday. Has a hx of sinus infection unsure if swelling to right eye related to sinus infection or to the facial burning. Pt reports cleaning burn site with antiseptic liquid, applying silvex ointment, and hydrogel dressing to site.

## 2020-08-30 NOTE — Discharge Instructions (Addendum)
Take the antibiotics as prescribed Follow up as needed for continued or worsening symptoms

## 2020-08-30 NOTE — ED Provider Notes (Signed)
Roderic Palau    CSN: [id number] Arrival date & time: 08/30/20  1937      History   Chief Complaint Chief Complaint  Patient presents with  . Facial Burn    HPI Sonya Spencer is a 34 y.o. female.   Pt is a 34 year old female that presents with right-sided facial swelling.  Reporting she burned the right side of her face with a chronic iron on Tuesday.  Since she has been cleaning the area well.  Last night started to develop presignificant right eye swelling and tenderness.  Denies any pain in the eye itself or trouble with vision.  Also has a history of sinus infections and gets swelling with sinus infections.  She has been using antiseptic liquid applying Silvadene ointment and hydrogel dressing.  Low-grade fever here today.     Past Medical History:  Diagnosis Date  . Abnormal glucose tolerance test (GTT) during pregnancy, antepartum 12/23/2017   - pt check BG log.   - Lifestyles referral for presumptive GDM (also due to inordinate weight loss early in pregnancy) - will treat as GDM.  Patient has refused 3 hour gtt.    . Allergy   . Anemia    with pregnancy only  . Anxiety   . Asthma    only with pregnancy  . Cholelithiasis   . Complication of anesthesia    panic attack with first panic  . Depression   . Fetal macrosomia during pregnancy in third trimester 03/25/2015   Resolved with delivery   . Frequent headaches    migraines with pregnancy  . GERD (gastroesophageal reflux disease)   . Gestational diabetes   . Gestational diabetes mellitus (GDM) affecting third pregnancy 02/05/2018  . H/O maternal third degree perineal laceration, currently pregnant   . History of Papanicolaou smear of cervix 10/10/11; 08/02/14   neg, ct neg; neg ct//gc/tr neg  . PONV (postoperative nausea and vomiting)   . Supervision of normal pregnancy 11/09/2017   Clinic [facility] Prenatal Labs Dating  Blood type:    Genetic Screen 1 Screen:    AFP:     Quad:     NIPS: Antibody:   Anatomic [place]  Rubella:   Varicella:   GTT Early:               Third trimester:  RPR:    Rhogam  HBsAg:    TDaP vaccine                       Flu Shot: HIV:    Baby Food                                GBS:  Contraception  Pap: CBB    CS/VBAC   Support Person        . Urinary incontinence     Patient Active Problem List   Diagnosis Date Noted  . Anorexia 05/01/2020  . ETOH abuse 05/01/2020  . Chronic cholecystitis   . Symptomatic cholelithiasis 10/06/2018  . Low-lying placenta 05/12/2018  . Fall (on) (from) other stairs and steps, initial encounter 03/05/2018  . History of macrosomia in infant in prior pregnancy, currently pregnant 11/09/2017  . Nausea and vomiting during pregnancy 11/09/2017  . History of cesarean delivery 11/09/2017  . Lipoma of axilla 06/22/2017  . Anxiety and depression 05/11/2017  . Frequent headaches 05/11/2017    Past Surgical History:  Procedure Laterality Date  . CESAREAN SECTION N/A 03/22/2015   Procedure: CESAREAN SECTION;  Surgeon: Will Bonnet, MD;  Location: [facility] ORS;  Service: Obstetrics;  Laterality: N/A;  . CESAREAN SECTION WITH BILATERAL TUBAL LIGATION N/A 06/24/2018   Procedure: CESAREAN SECTION WITH BILATERAL TUBAL LIGATION;  Surgeon: Will Bonnet, MD;  Location: [facility] ORS;  Service: Obstetrics;  Laterality: N/A;  . CHOLECYSTECTOMY N/A 10/11/2018   Procedure: LAPAROSCOPIC CHOLECYSTECTOMY;  Surgeon: Vickie Epley, MD;  Location: [facility] ORS;  Service: General;  Laterality: N/A;  . FOREIGN BODY REMOVAL      OB History    Gravida  4   Para  4   Term  4   Preterm  0   AB  0   Living  4     SAB  0   TAB  0   Ectopic  0   Multiple  0   Live Births  4        Obstetric Comments  1st Menstrual Cycle:  13 1st Pregnancy:  23          Home Medications    Prior to Admission medications   Medication Sig Start Date End Date Taking? Authorizing Provider  albuterol (VENTOLIN HFA) 108 (90 Base) MCG/ACT inhaler TAKE 2 PUFFS  BY MOUTH EVERY 6 HOURS AS NEEDED FOR WHEEZE 12/12/18   [provider]  amoxicillin-clavulanate (AUGMENTIN) 875-125 MG tablet Take 1 tablet by mouth every 12 (twelve) hours. 08/30/20   Loura Halt A, NP  buPROPion (WELLBUTRIN XL) 300 MG 24 hr tablet Take 1 tablet (300 mg total) by mouth at bedtime. 10/03/19   Karamalegos, Devonne Doughty, DO  cetirizine (ZYRTEC) 10 MG tablet Take 1 tablet (10 mg total) by mouth every evening. 10/06/19   Karamalegos, Devonne Doughty, DO  escitalopram (LEXAPRO) 20 MG tablet Take 1 tablet (20 mg total) by mouth at bedtime. 10/03/19   Karamalegos, Devonne Doughty, DO  gabapentin (NEURONTIN) 600 MG tablet For alcohol detox - all doses are 300mg  (half tab per dose) - Day 1 take half tab every 6 hours, Day 2 take every 8 hours, Day 3 take every 12 hours, Day 4-7 take 1 daily 05/01/20   Malfi, Lupita Raider, FNP  nortriptyline (PAMELOR) 10 MG capsule Take 1 capsule (10 mg total) by mouth at bedtime for 7 days, THEN 2 capsules (20 mg total) at bedtime for 7 days, THEN 3 capsules (30 mg total) at bedtime for 14 days. 05/01/20 05/29/20  Verl Bangs, FNP  omeprazole (PRILOSEC) 20 MG capsule TAKE 1 CAPSULE BY MOUTH ONCE DAILY 03/10/19   Parks Ranger, Devonne Doughty, DO  ondansetron (ZOFRAN ODT) 4 MG disintegrating tablet Take 1 tablet (4 mg total) by mouth every 8 (eight) hours as needed. 06/22/19   Marylene Land, NP  rizatriptan (MAXALT) 10 MG tablet Take 1 tablet (10 mg total) by mouth as needed for migraine. May repeat in 2 hours if needed 05/01/20   Malfi, Lupita Raider, FNP    Family History Family History  Problem Relation Age of Onset  . Lupus Mother   . Hepatitis C Mother   . Depression Mother   . Diabetes type I Father   . Diabetes Father        type 1  . Thyroid disease Father   . Lupus Sister   . Lupus Maternal Grandmother   . Diabetes Maternal Grandmother   . Diabetes type I Daughter   . Diabetes Daughter   . Lupus Maternal Aunt   .  Diabetes Paternal Grandfather        type 1  .  Diabetes Paternal Aunt        type 1 - runs in father's side    Social History Social History   Tobacco Use  . Smoking status: Former Smoker    Packs/day: 0.25    Years: 2.00    Pack years: 0.50    Types: Cigarettes    Quit date: 03/13/2009    Years since quitting: 11.4  . Smokeless tobacco: Never Used  Vaping Use  . Vaping Use: Never used  Substance Use Topics  . Alcohol use: Not Currently    Alcohol/week: 3.0 standard drinks    Types: 3 Shots of liquor per week    Comment: x 3 daily   . Drug use: No     Allergies   Nickel and Red dye   Review of Systems Review of Systems   Physical Exam Triage Vital Signs ED Triage Vitals  Enc Vitals Group     BP 08/30/20 0836 116/81     Pulse Rate 08/30/20 0836 80     Resp 08/30/20 0836 16     Temp 08/30/20 0836 99 F (37.2 C)     Temp Source 08/30/20 0836 Oral     SpO2 08/30/20 0836 96 %     Weight --      Height --      Head Circumference --      Peak Flow --      Pain Score 08/30/20 0848 3     Pain Loc --      Pain Edu? --      Excl. in [place]? --    No data found.  Updated Vital Signs BP 116/81 (BP Location: Left Arm)   Pulse 80   Temp 99 F (37.2 C) (Oral)   Resp 16   SpO2 96%   Visual Acuity Right Eye Distance:   Left Eye Distance:   Bilateral Distance:    Right Eye Near:   Left Eye Near:    Bilateral Near:     Physical Exam Vitals and nursing note reviewed.  Constitutional:      General: She is not in acute distress.    Appearance: Normal appearance. She is not ill-appearing, toxic-appearing or diaphoretic.  HENT:     Head: Normocephalic.      Comments: Burn to face that is healing.     Nose: Nose normal.  Eyes:     Conjunctiva/sclera: Conjunctivae normal.  Pulmonary:     Effort: Pulmonary effort is normal.  Musculoskeletal:        General: Normal range of motion.     Cervical back: Normal range of motion.  Skin:    General: Skin is warm and dry.     Findings: No rash.  Neurological:      Mental Status: She is alert.  Psychiatric:        Mood and Affect: Mood normal.      UC Treatments / Results  Labs (all labs ordered are listed, but only abnormal results are displayed) Labs Reviewed - No data to display  EKG   Radiology No results found.  Procedures Procedures (including critical care time)  Medications Ordered in UC Medications - No data to display  Initial Impression / Assessment and Plan / UC Course  I have reviewed the triage vital signs and the nursing notes.  Pertinent labs & imaging results that were available during my care of the  patient were reviewed by me and considered in my medical decision making (see chart for details).     Right facial swelling Concern for peri orbital cellulitis.  Treating with Augmentin Close follow up Follow up as needed for continued or worsening symptoms   Final Clinical Impressions(s) / UC Diagnoses   Final diagnoses:  Right facial swelling     Discharge Instructions     Take the antibiotics as prescribed Follow up as needed for continued or worsening symptoms     ED Prescriptions    Medication Sig Dispense Auth. Provider   amoxicillin-clavulanate (AUGMENTIN) 875-125 MG tablet  (Status: Discontinued) Take 1 tablet by mouth every 12 (twelve) hours. 14 tablet Adraine Biffle A, NP   amoxicillin-clavulanate (AUGMENTIN) 875-125 MG tablet Take 1 tablet by mouth every 12 (twelve) hours. 14 tablet Ingvald Theisen A, NP     PDMP not reviewed this encounter.   Orvan July, NP 08/30/20 [phone]

## 2020-11-07 ENCOUNTER — Other Ambulatory Visit: Payer: Self-pay | Admitting: Family Medicine

## 2020-11-07 DIAGNOSIS — F32A Depression, unspecified: Secondary | ICD-10-CM

## 2020-11-07 DIAGNOSIS — O0990 Supervision of high risk pregnancy, unspecified, unspecified trimester: Secondary | ICD-10-CM

## 2020-11-07 DIAGNOSIS — F419 Anxiety disorder, unspecified: Secondary | ICD-10-CM

## 2020-11-07 NOTE — Telephone Encounter (Signed)
Medication Refill - Medication: buPROPion (WELLBUTRIN XL) 300 MG 24 hr tablet   Takes last pill tonight, requesting 90 day supply  Has the patient contacted their pharmacy? No. (Agent: If no, request that the patient contact the pharmacy for the refill.) (Agent: If yes, when and what did the pharmacy advise?)  Preferred Pharmacy (with phone number or street name):  [facility] [place] [facility] [address]  [place] [address]  Phone: [phone] Fax: [phone]    Agent: Please be advised that RX refills may take up to 3 business days. We ask that you follow-up with your pharmacy.

## 2020-11-08 MED ORDER — BUPROPION HCL ER (XL) 300 MG PO TB24: 300.0000 mg | ORAL_TABLET | Freq: Every day | ORAL | 1 refills | Status: DC

## 2020-12-13 ENCOUNTER — Telehealth: Payer: Self-pay

## 2020-12-13 NOTE — Telephone Encounter (Signed)
Typically we would follow-up with a confirmation with urine pregnancy and beta hcg lab, however given her circumstances, time is important and I would feel more comfortable if we had GYN involved now.  She was followed by [facility] Dr Prentice Docker in past for prior pregnancy.  I would ask that she contact them directly. If needed we can place urgent referral and call their office.  Sonya Spencer, [facility] 12/13/2020, 11:03 AM

## 2020-12-13 NOTE — Telephone Encounter (Signed)
Copied from [place] [id number]. Topic: General - Other >> Dec 13, 2020 10:14 AM Oneta Rack wrote: Reason for CRM: patient has her tubes tides and received 2 positive pregnancy test and she is concerned about a ectopic pregnancy. Patient would like lab or urine orders.Offered patient appointment but patient requested a 8am or after 1pm as soon as possible. Patient would like a follow up call as soon as possible

## 2020-12-13 NOTE — Telephone Encounter (Signed)
Called [facility]. Spoke with Dr Gilman Schmidt, reviewed case, she is updated now and agrees to see patient promptly likely tomorrow 3/18 and recommends urine pregnancy test, beta hcg, and STAT ultrasound to evaluate her.  No further testing needed by our office at this time. Patient will follow up with OBGYN now.  Patient was attempting to schedule with them at our recommendation.  Will follow along for updates on chart.  Nobie Putnam, DO [facility] 12/13/2020, 12:23 PM

## 2020-12-14 ENCOUNTER — Encounter: Payer: Self-pay | Admitting: Obstetrics and Gynecology

## 2020-12-14 ENCOUNTER — Ambulatory Visit (INDEPENDENT_AMBULATORY_CARE_PROVIDER_SITE_OTHER): Payer: Commercial Managed Care - PPO | Admitting: Obstetrics and Gynecology

## 2020-12-14 ENCOUNTER — Other Ambulatory Visit: Payer: Self-pay

## 2020-12-14 ENCOUNTER — Ambulatory Visit: Payer: Commercial Managed Care - PPO | Admitting: Obstetrics and Gynecology

## 2020-12-14 VITALS — BP 120/70 | Ht 63.0 in | Wt 194.0 lb

## 2020-12-14 DIAGNOSIS — R350 Frequency of micturition: Secondary | ICD-10-CM | POA: Diagnosis not present

## 2020-12-14 DIAGNOSIS — Z3490 Encounter for supervision of normal pregnancy, unspecified, unspecified trimester: Secondary | ICD-10-CM | POA: Diagnosis not present

## 2020-12-14 LAB — POCT URINE PREGNANCY: Preg Test, Ur: NEGATIVE

## 2020-12-14 NOTE — Addendum Note (Signed)
Addended by: Drenda Freeze on: 12/14/2020 11:32 AM   Modules accepted: Orders

## 2020-12-14 NOTE — Progress Notes (Signed)
Patient ID: Sonya Spencer, female   DOB: 1986/07/31, 35 y.o.   MRN: [id number]  Reason for Consult: Positive Home UPT   Referred by No ref. provider found  Subjective:     HPI:  Sonya Spencer is a 35 y.o. female . She presents today for two positive home pregnancy tests which occurred on Wednesday and Thursday of this week. She has a history of a tubal ligation.  She took the test as a joke because her friend is pregnant. She has been urinating frequently and is having some vague abdominal pain or cramping. Some nausea.    Gynecological History  Patient's last menstrual period was 11/27/2020 (approximate). Menarche: 11-14  History of fibroids, polyps, or ovarian cysts? : no  History of PCOS? no Hstory of Endometriosis? no History of abnormal pap smears? no Have you had any sexually transmitted infections in the past? no  She reports HPV vaccination in the past.   Last Pap: Results were: 2019   She identifies as a female. She is sexually active with men.   She denies dyspareunia. She denies postcoital bleeding.  She currently uses condoms and tubal ligation for contraception.   Obstetrical History OB History  Gravida Para Term Preterm AB Living  4 4 4  0 0 4  SAB IAB Ectopic Multiple Live Births  0 0 0 0 4    # Outcome Date GA Lbr Len/2nd Weight Sex Delivery Anes PTL Lv  4 Term 06/24/18 [redacted]w[redacted]d  7 lb 13.9 oz (3.57 kg) M CS-LTranv Spinal  LIV  3 Term 03/22/15 [redacted]w[redacted]d  11 lb 7.8 oz (5.211 kg) M CS-LTranv Spinal  LIV     Complications: Fetal macrosomia  2 Term 06/02/12 [redacted]w[redacted]d  8 lb 14 oz (4.026 kg) F Vag-Spont EPI  LIV     Birth Comments: TEARING SECOND DEGREE  1 Term 01/02/10 [redacted]w[redacted]d  9 lb 11 oz (4.394 kg) F Vag-Vacuum EPI Y LIV     Birth Comments: IOL AT [redacted]W[redacted]D; LARGE BABY, MAVVUM FOR MATERNAL EXHAUSTION AND VARIABLE DECELS WITH FETAL TACHYDARDIA, PARTIAL 3RD DEGREE LACERATION    Obstetric Comments  1st Menstrual Cycle:  13  1st Pregnancy:  23     Past Medical  History:  Diagnosis Date  . Abnormal glucose tolerance test (GTT) during pregnancy, antepartum 12/23/2017   - pt check BG log.   - Lifestyles referral for presumptive GDM (also due to inordinate weight loss early in pregnancy) - will treat as GDM.  Patient has refused 3 hour gtt.    . Allergy   . Anemia    with pregnancy only  . Anxiety   . Asthma    only with pregnancy  . Cholelithiasis   . Complication of anesthesia    panic attack with first panic  . Depression   . Fetal macrosomia during pregnancy in third trimester 03/25/2015   Resolved with delivery   . Frequent headaches    migraines with pregnancy  . GERD (gastroesophageal reflux disease)   . Gestational diabetes   . Gestational diabetes mellitus (GDM) affecting third pregnancy 02/05/2018  . H/O maternal third degree perineal laceration, currently pregnant   . History of Papanicolaou smear of cervix 10/10/11; 08/02/14   neg, ct neg; neg ct//gc/tr neg  . PONV (postoperative nausea and vomiting)   . Supervision of normal pregnancy 11/09/2017   Clinic [facility] Prenatal Labs Dating  Blood type:    Genetic Screen 1 Screen:    AFP:     Quad:  NIPS: Antibody:  Anatomic [place]  Rubella:   Varicella:   GTT Early:               Third trimester:  RPR:    Rhogam  HBsAg:    TDaP vaccine                       Flu Shot: HIV:    Baby Food                                GBS:  Contraception  Pap: CBB    CS/VBAC   Support Person        . Urinary incontinence    Family History  Problem Relation Age of Onset  . Lupus Mother   . Hepatitis C Mother   . Depression Mother   . Diabetes type I Father   . Diabetes Father        type 1  . Thyroid disease Father   . Lupus Sister   . Lupus Maternal Grandmother   . Diabetes Maternal Grandmother   . Diabetes type I Daughter   . Diabetes Daughter   . Lupus Maternal Aunt   . Diabetes Paternal Grandfather        type 1  . Diabetes Paternal Aunt        type 1 - runs in father's side   Past Surgical  History:  Procedure Laterality Date  . CESAREAN SECTION N/A 03/22/2015   Procedure: CESAREAN SECTION;  Surgeon: Will Bonnet, MD;  Location: [facility] ORS;  Service: Obstetrics;  Laterality: N/A;  . CESAREAN SECTION WITH BILATERAL TUBAL LIGATION N/A 06/24/2018   Procedure: CESAREAN SECTION WITH BILATERAL TUBAL LIGATION;  Surgeon: Will Bonnet, MD;  Location: [facility] ORS;  Service: Obstetrics;  Laterality: N/A;  . CHOLECYSTECTOMY N/A 10/11/2018   Procedure: LAPAROSCOPIC CHOLECYSTECTOMY;  Surgeon: Vickie Epley, MD;  Location: [facility] ORS;  Service: General;  Laterality: N/A;  . FOREIGN BODY REMOVAL      Short Social History:  Social History   Tobacco Use  . Smoking status: Former Smoker    Packs/day: 0.25    Years: 2.00    Pack years: 0.50    Types: Cigarettes    Quit date: 03/13/2009    Years since quitting: 11.7  . Smokeless tobacco: Never Used  Substance Use Topics  . Alcohol use: Not Currently    Alcohol/week: 3.0 standard drinks    Types: 3 Shots of liquor per week    Comment: x 3 daily     Allergies  Allergen Reactions  . Nickel Rash  . Red Dye Rash    Includes Benadryl as it has red dye in it.    Current Outpatient Medications  Medication Sig Dispense Refill  . albuterol (VENTOLIN HFA) 108 (90 Base) MCG/ACT inhaler TAKE 2 PUFFS BY MOUTH EVERY 6 HOURS AS NEEDED FOR WHEEZE    . buPROPion (WELLBUTRIN XL) 300 MG 24 hr tablet Take 1 tablet (300 mg total) by mouth at bedtime. 90 tablet 1  . cetirizine (ZYRTEC) 10 MG tablet Take 1 tablet (10 mg total) by mouth every evening. 90 tablet 3  . escitalopram (LEXAPRO) 20 MG tablet Take 1 tablet (20 mg total) by mouth at bedtime. 90 tablet 1  . omeprazole (PRILOSEC) 20 MG capsule TAKE 1 CAPSULE BY MOUTH ONCE DAILY 30 capsule 2  . ondansetron (ZOFRAN ODT) 4 MG disintegrating tablet  Take 1 tablet (4 mg total) by mouth every 8 (eight) hours as needed. 10 tablet 0   No current facility-administered medications for this visit.     Review of Systems  Constitutional: Negative for chills, fatigue, fever and unexpected weight change.  HENT: Negative for trouble swallowing.  Eyes: Negative for loss of vision.  Respiratory: Negative for cough, shortness of breath and wheezing.  Cardiovascular: Negative for chest pain, leg swelling, palpitations and syncope.  GI: Negative for abdominal pain, blood in stool, diarrhea, nausea and vomiting.  GU: Positive for frequency. Negative for difficulty urinating, dysuria and hematuria.  Musculoskeletal: Negative for back pain, leg pain and joint pain.  Skin: Negative for rash.  Neurological: Negative for dizziness, headaches, light-headedness, numbness and seizures.  Psychiatric: Negative for behavioral problem, confusion, depressed mood and sleep disturbance.        Objective:  Objective   Vitals:   12/14/20 0817  BP: 120/70  Weight: 194 lb (88 kg)  Height: 5\' 3"  (1.6 m)   Body mass index is 34.37 kg/m.  Physical Exam Vitals and nursing note reviewed. Exam conducted with a chaperone present.  Constitutional:      Appearance: Normal appearance.  HENT:     Head: Normocephalic and atraumatic.  Eyes:     Extraocular Movements: Extraocular movements intact.     Pupils: Pupils are equal, round, and reactive to light.  Cardiovascular:     Rate and Rhythm: Normal rate and regular rhythm.  Pulmonary:     Effort: Pulmonary effort is normal.     Breath sounds: Normal breath sounds.  Abdominal:     General: Abdomen is flat. There is no distension.     Palpations: Abdomen is soft. There is no mass.     Tenderness: There is no abdominal tenderness. There is no guarding or rebound.  Musculoskeletal:     Cervical back: Normal range of motion.  Skin:    General: Skin is warm and dry.  Neurological:     General: No focal deficit present.     Mental Status: She is alert and oriented to person, place, and time.  Psychiatric:        Behavior: Behavior normal.         Thought Content: Thought content normal.        Judgment: Judgment normal.     Assessment/Plan:     36 yo with positive home pregnancy test and frequent urination Pregnancy test in office was negative Will check beta hcg, if also negative no further care needed.  Will send urine culture for urinary frequency  More than 20 minutes were spent face to face with the patient in the room, reviewing the medical record, labs and images, and coordinating care for the patient. The plan of management was discussed in detail and counseling was provided.    Adrian Prows MD [facility] 12/14/2020 8:32 AM

## 2020-12-14 NOTE — Patient Instructions (Signed)
Ectopic Pregnancy  An ectopic pregnancy happens when a fertilized egg attaches (implants) outside the uterus. In a normal pregnancy, a fertilized egg implants in the uterus. An ectopic pregnancy cannot develop into a healthy baby. Most ectopic pregnancies occur in one of the fallopian tubes, which is where an egg travels from an ovary to get to the uterus. This is called a tubal pregnancy. An ectopic pregnancy can also happen on an ovary, on the cervix, or in the abdomen. When a fertilized egg implants on tissue outside the uterus and begins to grow, it may cause the tissue to tear or burst. This is known as a ruptured ectopic pregnancy. The tear or burst causes internal bleeding. This may cause intense pain in the abdomen. An ectopic pregnancy is a medical emergency and can be life-threatening. What are the causes? The most common cause of this condition is damage to one of the fallopian tubes. A fallopian tube may be narrowed or blocked, and that stops the fertilized egg from reaching the uterus. Sometimes, the cause of this condition is not known. What increases the risk? The following factors may make you more likely to develop this condition:  Having gone through infertility treatment before.  Having had an ectopic pregnancy before.  Having had surgery to have the fallopian tubes tied.  Becoming pregnant while using an intrauterine device for birth control.  Taking birth control pills before the age of 61. Other risk factors include:  Smoking.  Alcohol use.  History of DES exposure. DES is a medicine that was used until 1971 and affected babies whose mothers took the medicine. What are the signs or symptoms? Common symptoms of this condition include:  Missing a menstrual period.  Nausea or tiredness.  Tender breasts.  Other normal pregnancy symptoms. Other symptoms may include:  Pain during sex.  Vaginal bleeding or spotting.  Cramping or pain in the lower abdomen.  A  fast heartbeat, low blood pressure, and sweating.  Pain or increased pressure while having a bowel movement. Symptoms of a ruptured ectopic pregnancy and internal bleeding may include:  Sudden, severe pain in the abdomen.  Dizziness, weakness, feeling light-headed, or fainting.  Pain in the shoulder or neck area. How is this diagnosed? This condition is diagnosed by:  A blood test to check for the pregnancy hormone.  A pelvic exam to find painful areas or a mass in the abdomen.  Ultrasound. A probe is inserted into the vagina to see if there is a pregnancy in or outside the uterus.  Taking a sample of tissue from the uterus.  Surgery to look closely at the fallopian tubes through an incision in the abdomen. How is this treated? This condition is usually treated with medicine or surgery. Sometimes, ectopic pregnancies can resolve on their own, under close monitoring by your health care provider. Medicine A medicine called methotrexate may be given to cause the pregnancy tissue to be absorbed. The medicine may be given if:  The diagnosis is made early, with no signs of active bleeding.  The fallopian tube has not torn or burst. You will need blood tests to make sure the medicine is working. It may take 4-6 weeks for the pregnancy tissues to be absorbed. Surgery Surgery may be performed to:  Remove the pregnancy tissue.  Stop internal bleeding.  Remove part or all of the fallopian tube.  Remove the uterus. This is rare. After surgery, you may need to have blood tests to make sure the surgery worked.  Follow these instructions at home: Medicines  Take over-the-counter and prescription medicines only as told by your health care provider.  Ask your health care provider if the medicine prescribed to you: ? Requires you to avoid driving or using machinery. ? Can cause constipation. You may need to take these actions to prevent or treat constipation:  Drink enough fluid to  keep your urine pale yellow.  Take over-the-counter or prescription medicines.  Eat foods that are high in fiber, such as beans, whole grains, and fresh fruits and vegetables.  Limit foods that are high in fat and processed sugars, such as fried or sweet foods. General instructions  Rest or limit your activity, if told by your health care provider.  Do not have sex or put anything in your vagina, such as tampons or douches, for 6 weeks or until your health care provider says it is safe.  Do not lift anything that is heavier than 10 lb (4.5 kg), or the limit that you are told, until your health care provider says that it is safe.  Return to your normal activities as told by your health care provider. Ask your health care provider what activities are safe for you.  Keep all follow-up visits. This is important. Contact a health care provider if:  You have a fever or chills.  You have nausea and vomiting. Get help right away if:  Your pain gets worse or is not relieved by medicine.  You feel dizzy or weak.  You feel light-headed or you faint.  You have sudden, severe pain in your abdomen.  You have sudden pain in the shoulder or neck area. Summary  An ectopic pregnancy happens when a fertilized egg implants outside the uterus. Most ectopic pregnancies occur in one of the fallopian tubes.  An ectopic pregnancy is a medical emergency and can be life-threatening.  The most common cause of this condition is damage to one of the fallopian tubes.  This condition is usually treated with medicine or surgery. Some ectopic pregnancies resolve on their own, under close monitoring by your health care provider. This information is not intended to replace advice given to you by your health care provider. Make sure you discuss any questions you have with your health care provider. Document Revised: 12/27/2019 Document Reviewed: 12/27/2019 Elsevier Patient Education  [place].

## 2020-12-15 LAB — BETA HCG QUANT (REF LAB): hCG Quant: <1 m[IU]/mL

## 2020-12-17 LAB — URINE CULTURE

## 2021-01-01 ENCOUNTER — Encounter: Payer: Self-pay | Admitting: Unknown Physician Specialty

## 2021-01-01 ENCOUNTER — Ambulatory Visit: Payer: Commercial Managed Care - PPO | Admitting: Family Medicine

## 2021-01-01 ENCOUNTER — Ambulatory Visit (INDEPENDENT_AMBULATORY_CARE_PROVIDER_SITE_OTHER): Payer: Commercial Managed Care - PPO | Admitting: Unknown Physician Specialty

## 2021-01-01 ENCOUNTER — Other Ambulatory Visit: Payer: Self-pay

## 2021-01-01 DIAGNOSIS — F32A Depression, unspecified: Secondary | ICD-10-CM | POA: Diagnosis not present

## 2021-01-01 DIAGNOSIS — F419 Anxiety disorder, unspecified: Secondary | ICD-10-CM

## 2021-01-01 MED ORDER — CITALOPRAM HYDROBROMIDE 10 MG PO TABS: 10.0000 mg | ORAL_TABLET | Freq: Every day | ORAL | 3 refills | Status: DC

## 2021-01-01 NOTE — Progress Notes (Signed)
BP 114/88 (BP Location: Left Arm, Patient Position: Sitting, Cuff Size: Large)   Pulse 88   Temp 98.6 F (37 C) (Temporal)   Resp 18   Ht 5\' 3"  (1.6 m)   Wt 195 lb 9.6 oz (88.7 kg)   LMP 12/26/2020   SpO2 100%   BMI 34.65 kg/m    Subjective:    Patient ID: Sonya Spencer, female    DOB: 04-24-1986, 35 y.o.   MRN: [id number]  HPI: Sonya Spencer is a 35 y.o. female  Chief Complaint  Patient presents with  . Anxiety    Depression. The pt would like to discuss going on something besides the Bupropion. She is currently doing Online Therapy with [employer] with a License Psychotherapist.   Pt states she used to take Lexapro and Buproprion.  This worked well but it was concerned about mixing medications as it looks like she had a severe serotonin response to several medications at once and had suicidal ideation.  No ETOH for 5 months.    Depression screen [facility] 2/9 01/01/2021 05/01/2020 03/08/2019 12/28/2017 10/05/2017  Decreased Interest 1 3 1 1 1   Down, Depressed, Hopeless 1 3 1  0 1  PHQ - 2 Score 2 6 2 1 2   Altered sleeping 3 3 0 - 2  Tired, decreased energy 2 3 2  - 1  Change in appetite 3 3 1  - 2  Feeling bad or failure about yourself  1 3 0 - 1  Trouble concentrating 3 3 3  - 1  Moving slowly or fidgety/restless 1 0 0 - 0  Suicidal thoughts 0 3 0 - 0  PHQ-9 Score 15 24 8  - 9  Difficult doing work/chores - Very difficult Not difficult at all - Somewhat difficult   GAD 7 : Generalized Anxiety Score 01/01/2021 05/01/2020 03/08/2019 10/05/2017  Nervous, Anxious, on Edge 2 2 0 1  Control/stop worrying 1 2 0 0  Worry too much - different things 1 2 0 0  Trouble relaxing 2 3 0 1  Restless 2 2 0 1  Easily annoyed or irritable 2 2 0 1  Afraid - awful might happen 1 1 0 0  Total GAD 7 Score 11 14 0 4  Anxiety Difficulty - Somewhat difficult Not difficult at all Somewhat difficult      Relevant past medical, surgical, family and social history reviewed and updated as indicated. Interim  medical history since our last visit reviewed. Allergies and medications reviewed and updated.  Review of Systems  Per HPI unless specifically indicated above     Objective:    BP 114/88 (BP Location: Left Arm, Patient Position: Sitting, Cuff Size: Large)   Pulse 88   Temp 98.6 F (37 C) (Temporal)   Resp 18   Ht 5\' 3"  (1.6 m)   Wt 195 lb 9.6 oz (88.7 kg)   LMP 12/26/2020   SpO2 100%   BMI 34.65 kg/m   Wt Readings from Last 3 Encounters:  01/01/21 195 lb 9.6 oz (88.7 kg)  12/14/20 194 lb (88 kg)  05/01/20 200 lb 9.6 oz (91 kg)    Physical Exam Constitutional:      General: She is not in acute distress.    Appearance: Normal appearance. She is well-developed.  HENT:     Head: Normocephalic and atraumatic.  Eyes:     General: Lids are normal. No scleral icterus.       Right eye: No discharge.  Left eye: No discharge.     Conjunctiva/sclera: Conjunctivae normal.  Neck:     Vascular: No carotid bruit or JVD.  Cardiovascular:     Rate and Rhythm: Normal rate and regular rhythm.     Heart sounds: Normal heart sounds.  Pulmonary:     Effort: Pulmonary effort is normal.     Breath sounds: Normal breath sounds.  Abdominal:     Palpations: There is no hepatomegaly or splenomegaly.  Musculoskeletal:        General: Normal range of motion.     Cervical back: Normal range of motion and neck supple.  Skin:    General: Skin is warm and dry.     Coloration: Skin is not pale.     Findings: No rash.  Neurological:     Mental Status: She is alert and oriented to person, place, and time.  Psychiatric:        Behavior: Behavior normal.        Thought Content: Thought content normal.        Judgment: Judgment normal.     Results for orders placed or performed in visit on 12/14/20  Urine Culture   Specimen: Urine   UR  Result Value Ref Range   Urine Culture, Routine Final report    Organism ID, Bacteria Comment   Beta hCG quant (ref lab)  Result Value Ref Range    hCG Quant <1 mIU/mL  POCT urine pregnancy  Result Value Ref Range   Preg Test, Ur Negative Negative      Assessment & Plan:   Problem List Items Addressed This Visit      Unprioritized   Anxiety and depression    Incomplete response to Wellbutrin.  Was on a high dose of Lexapro in the past which she did well with until headache medications were added.  Will add Citalopram 10 mg.  She and her friend will monitor for adverse events, in particular, suicidal ideation.  No firearms in house.  Close f/u in 2 weeks      Relevant Medications   citalopram (CELEXA) 10 MG tablet       Follow up plan: Return in about 2 weeks (around 01/15/2021).

## 2021-01-01 NOTE — Assessment & Plan Note (Signed)
Incomplete response to Wellbutrin.  Was on a high dose of Lexapro in the past which she did well with until headache medications were added.  Will add Citalopram 10 mg.  She and her friend will monitor for adverse events, in particular, suicidal ideation.  No firearms in house.  Close f/u in 2 weeks

## 2021-01-15 ENCOUNTER — Ambulatory Visit: Payer: Commercial Managed Care - PPO | Admitting: Unknown Physician Specialty

## 2021-02-18 ENCOUNTER — Encounter: Payer: Self-pay | Admitting: Unknown Physician Specialty

## 2021-05-01 ENCOUNTER — Ambulatory Visit: Payer: Commercial Managed Care - PPO

## 2021-07-16 ENCOUNTER — Emergency Department: Payer: Commercial Managed Care - PPO

## 2021-07-16 ENCOUNTER — Encounter: Payer: Self-pay | Admitting: *Deleted

## 2021-07-16 ENCOUNTER — Other Ambulatory Visit: Payer: Self-pay

## 2021-07-16 ENCOUNTER — Emergency Department
Admission: EM | Admit: 2021-07-16 | Discharge: 2021-07-16 | Disposition: A | Discharge status: Home / Self Care | Payer: Commercial Managed Care - PPO | Attending: Emergency Medicine | Admitting: Emergency Medicine

## 2021-07-16 DIAGNOSIS — Z87891 Personal history of nicotine dependence: Secondary | ICD-10-CM | POA: Insufficient documentation

## 2021-07-16 DIAGNOSIS — J45909 Unspecified asthma, uncomplicated: Secondary | ICD-10-CM | POA: Diagnosis not present

## 2021-07-16 DIAGNOSIS — R079 Chest pain, unspecified: Secondary | ICD-10-CM

## 2021-07-16 LAB — BASIC METABOLIC PANEL
Anion gap: 7 (ref 5–15)
BUN: 16 mg/dL (ref 6–20)
CO2: 24 mmol/L (ref 22–32)
Calcium: 9 mg/dL (ref 8.9–10.3)
Chloride: 104 mmol/L (ref 98–111)
Creatinine, Ser: 0.76 mg/dL (ref 0.44–1.00)
GFR, Estimated: >60 mL/min (ref >60)
Glucose, Bld: 105 mg/dL — ABNORMAL HIGH (ref 70–99)
Potassium: 4.1 mmol/L (ref 3.5–5.1)
Sodium: 135 mmol/L (ref 135–145)

## 2021-07-16 LAB — CBC
HCT: 33.8 % — ABNORMAL LOW (ref 36.0–46.0)
Hemoglobin: 10.9 g/dL — ABNORMAL LOW (ref 12.0–15.0)
MCH: 26.1 pg (ref 26.0–34.0)
MCHC: 32.2 g/dL (ref 30.0–36.0)
MCV: 80.9 fL (ref 80.0–100.0)
Platelets: 400 10*3/uL (ref 150–400)
RBC: 4.18 MIL/uL (ref 3.87–5.11)
RDW: 15.3 % (ref 11.5–15.5)
WBC: 11.7 10*3/uL — ABNORMAL HIGH (ref 4.0–10.5)
nRBC: 0 % (ref 0.0–0.2)

## 2021-07-16 LAB — TROPONIN I (HIGH SENSITIVITY)
Troponin I (High Sensitivity): <2 ng/L (ref <18)
Troponin I (High Sensitivity): <2 ng/L (ref <18)

## 2021-07-16 NOTE — ED Provider Notes (Signed)
[facility] Emergency Department Provider Note  Time seen: 10:11 PM  I have reviewed the triage vital signs and the nursing notes.   HISTORY  Chief Complaint Chest Pain   HPI Sonya Spencer is a 35 y.o. female with a past medical history of anemia, anxiety, asthma, depression, gastric reflux, presents to the emergency department for chest pain.  According to the patient around 5 or 6 PM tonight she developed pain in the center of her chest and somewhat in the left shoulder.  Patient denies any nausea or shortness of breath.  Patient states she continues to have very slight discomfort although improved.  Patient denies any chest pain previously although states she has had panic attacks anxiety issues in the past but this feels different.  Denies any cardiac history.   Past Medical History:  Diagnosis Date   Abnormal glucose tolerance test (GTT) during pregnancy, antepartum 12/23/2017   - pt check BG log.   - Lifestyles referral for presumptive GDM (also due to inordinate weight loss early in pregnancy) - will treat as GDM.  Patient has refused 3 hour gtt.     Allergy    Anemia    with pregnancy only   Anxiety    Asthma    only with pregnancy   Cholelithiasis    Complication of anesthesia    panic attack with first panic   Depression    Fetal macrosomia during pregnancy in third trimester 03/25/2015   Resolved with delivery    Frequent headaches    migraines with pregnancy   GERD (gastroesophageal reflux disease)    Gestational diabetes    Gestational diabetes mellitus (GDM) affecting third pregnancy 02/05/2018   H/O maternal third degree perineal laceration, currently pregnant    History of Papanicolaou smear of cervix 10/10/11; 08/02/14   neg, ct neg; neg ct//gc/tr neg   PONV (postoperative nausea and vomiting)    Supervision of normal pregnancy 11/09/2017   [facility] Prenatal Labs Dating  Blood type:    Genetic Screen 1 Screen:    AFP:     Quad:      NIPS: Antibody:  Anatomic [place]  Rubella:   Varicella:   GTT Early:               Third trimester:  RPR:    Rhogam  HBsAg:    TDaP vaccine                       Flu Shot: HIV:    Baby Food                                GBS:  Contraception  Pap: CBB    CS/VBAC   Support Person         Urinary incontinence     Patient Active Problem List   Diagnosis Date Noted   Anorexia 05/01/2020   ETOH abuse 05/01/2020   Low-lying placenta 05/12/2018   History of macrosomia in infant in prior pregnancy, currently pregnant 11/09/2017   History of cesarean delivery 11/09/2017   Lipoma of axilla 06/22/2017   Anxiety and depression 05/11/2017   Frequent headaches 05/11/2017    Past Surgical History:  Procedure Laterality Date   CESAREAN SECTION N/A 03/22/2015   Procedure: CESAREAN SECTION;  Surgeon: Will Bonnet, MD;  Location: [facility] ORS;  Service: Obstetrics;  Laterality: N/A;   CESAREAN SECTION WITH  BILATERAL TUBAL LIGATION N/A 06/24/2018   Procedure: CESAREAN SECTION WITH BILATERAL TUBAL LIGATION;  Surgeon: Will Bonnet, MD;  Location: [facility] ORS;  Service: Obstetrics;  Laterality: N/A;   CHOLECYSTECTOMY N/A 10/11/2018   Procedure: LAPAROSCOPIC CHOLECYSTECTOMY;  Surgeon: Vickie Epley, MD;  Location: [facility] ORS;  Service: General;  Laterality: N/A;   FOREIGN BODY REMOVAL      Prior to Admission medications   Medication Sig Start Date End Date Taking? Authorizing Provider  albuterol (VENTOLIN HFA) 108 (90 Base) MCG/ACT inhaler TAKE 2 PUFFS BY MOUTH EVERY 6 HOURS AS NEEDED FOR WHEEZE 12/12/18   [provider]  buPROPion (WELLBUTRIN XL) 300 MG 24 hr tablet Take 1 tablet (300 mg total) by mouth at bedtime. 11/08/20   Karamalegos, Devonne Doughty, DO  cetirizine (ZYRTEC) 10 MG tablet Take 1 tablet (10 mg total) by mouth every evening. 10/06/19   Karamalegos, Devonne Doughty, DO  citalopram (CELEXA) 10 MG tablet Take 1 tablet (10 mg total) by mouth daily. 01/01/21   Kathrine Haddock, NP  omeprazole  (PRILOSEC) 20 MG capsule TAKE 1 CAPSULE BY MOUTH ONCE DAILY Patient taking differently: Take 20 mg by mouth as needed. 03/10/19   Karamalegos, Devonne Doughty, DO  ondansetron (ZOFRAN ODT) 4 MG disintegrating tablet Take 1 tablet (4 mg total) by mouth every 8 (eight) hours as needed. 06/22/19   Marylene Land, NP    Allergies  Allergen Reactions   Nickel Rash   Red Dye Rash    Includes Benadryl as it has red dye in it.    Family History  Problem Relation Age of Onset   Lupus Mother    Hepatitis C Mother    Depression Mother    Diabetes type I Father    Diabetes Father        type 1   Thyroid disease Father    Lupus Sister    Lupus Maternal Grandmother    Diabetes Maternal Grandmother    Diabetes type I Daughter    Diabetes Daughter    Lupus Maternal Aunt    Diabetes Paternal Grandfather        type 1   Diabetes Paternal Aunt        type 1 - runs in father's side    Social History Social History   Tobacco Use   Smoking status: Former    Packs/day: 0.25    Years: 2.00    Pack years: 0.50    Types: Cigarettes    Quit date: 03/13/2009    Years since quitting: 12.3   Smokeless tobacco: Never  Vaping Use   Vaping Use: Never used  Substance Use Topics   Alcohol use: Not Currently    Alcohol/week: 3.0 standard drinks    Types: 3 Shots of liquor per week    Comment: x 3 daily    Drug use: No    Review of Systems Constitutional: Negative for fever. Cardiovascular: Mild central chest pain somewhat radiating to the left shoulder. Respiratory: Negative for shortness of breath.  Negative for cough.  No pleuritic pain. Gastrointestinal: Negative for abdominal pain, vomiting  Musculoskeletal: Negative for musculoskeletal complaints Neurological: Negative for headache All other ROS negative  ____________________________________________   PHYSICAL EXAM:  VITAL SIGNS: ED Triage Vitals  Enc Vitals Group     BP 07/16/21 1959 127/77     Pulse Rate 07/16/21 1959 79      Resp 07/16/21 1959 18     Temp 07/16/21 1959 98.7 F (37.1 C)  Temp Source 07/16/21 1959 Oral     SpO2 07/16/21 1959 98 %     Weight 07/16/21 1955 200 lb (90.7 kg)     Height 07/16/21 1955 5\' 3"  (1.6 m)     Head Circumference --      Peak Flow --      Pain Score 07/16/21 1955 9     Pain Loc --      Pain Edu? --      Excl. in [place]? --    Constitutional: Alert and oriented. Well appearing and in no distress. Eyes: Normal exam ENT      Head: Normocephalic and atraumatic.      Mouth/Throat: Mucous membranes are moist. Cardiovascular: Normal rate, regular rhythm.  Respiratory: Normal respiratory effort without tachypnea nor retractions. Breath sounds are clear  Gastrointestinal: Soft and nontender. No distention.   Musculoskeletal: Nontender with normal range of motion in all extremities.  Neurologic:  Normal speech and language. No gross focal neurologic deficits Skin:  Skin is warm, dry and intact.  Psychiatric: Mood and affect are normal.   ____________________________________________   EKG  EKG viewed and interpreted by myself shows a normal sinus rhythm at 80 bpm with a narrow QRS, normal axis, normal intervals, no concerning ST changes.  ____________________________________________    RADIOLOGY  Chest x-ray is clear  ____________________________________________   INITIAL IMPRESSION / ASSESSMENT AND PLAN / ED COURSE  Pertinent labs & imaging results that were available during my care of the patient were reviewed by me and considered in my medical decision making (see chart for details).   Patient presents emergency department for chest pain starting around 5:55 PM, mild currently.  Overall the patient appears well.  Chest pain is reproducible with palpation.  Patient's work-up thus far is reassuring including a negative troponin, clear chest x-ray and reassuring EKG.  Given the patient's timing of her chest pain we will repeat a troponin as a precaution.  As long as  the patient's repeat troponin is negative anticipate discharge home with PCP follow-up.  Offered to dose anxiety medication however patient wishes not to take any medications currently.  Repeat troponin negative.  We will discharge her PCP follow-up.  Patient agreeable to plan of care.  Ilka AMBRI MILTNER was evaluated in Emergency Department on 07/16/2021 for the symptoms described in the history of present illness. She was evaluated in the context of the global COVID-19 pandemic, which necessitated consideration that the patient might be at risk for infection with the SARS-CoV-2 virus that causes COVID-19. Institutional protocols and algorithms that pertain to the evaluation of patients at risk for COVID-19 are in a state of rapid change based on information released by regulatory bodies including the CDC and federal and state organizations. These policies and algorithms were followed during the patient's care in the ED.  ____________________________________________   FINAL CLINICAL IMPRESSION(S) / ED DIAGNOSES  Chest pain   Harvest Dark, MD 07/16/21 2330

## 2021-07-16 NOTE — ED Triage Notes (Signed)
Pt has left side chest pain.  Pt radiates into left arm.  Pt has sob.  Nonsmoker.  No cough.  Pt alert speech clear.

## 2021-11-12 ENCOUNTER — Encounter: Payer: Self-pay | Admitting: Emergency Medicine

## 2021-11-12 ENCOUNTER — Emergency Department: Payer: Commercial Managed Care - PPO

## 2021-11-12 ENCOUNTER — Emergency Department
Admission: EM | Admit: 2021-11-12 | Discharge: 2021-11-12 | Disposition: A | Discharge status: Home / Self Care | Payer: Commercial Managed Care - PPO | Attending: Emergency Medicine | Admitting: Emergency Medicine

## 2021-11-12 ENCOUNTER — Other Ambulatory Visit: Payer: Self-pay

## 2021-11-12 DIAGNOSIS — N9489 Other specified conditions associated with female genital organs and menstrual cycle: Secondary | ICD-10-CM | POA: Diagnosis not present

## 2021-11-12 DIAGNOSIS — W228XXA Striking against or struck by other objects, initial encounter: Secondary | ICD-10-CM | POA: Insufficient documentation

## 2021-11-12 DIAGNOSIS — S301XXA Contusion of abdominal wall, initial encounter: Secondary | ICD-10-CM | POA: Diagnosis not present

## 2021-11-12 DIAGNOSIS — Y990 Civilian activity done for income or pay: Secondary | ICD-10-CM | POA: Diagnosis not present

## 2021-11-12 DIAGNOSIS — J45909 Unspecified asthma, uncomplicated: Secondary | ICD-10-CM | POA: Diagnosis not present

## 2021-11-12 DIAGNOSIS — Y92512 Supermarket, store or market as the place of occurrence of the external cause: Secondary | ICD-10-CM | POA: Insufficient documentation

## 2021-11-12 DIAGNOSIS — R1031 Right lower quadrant pain: Secondary | ICD-10-CM

## 2021-11-12 DIAGNOSIS — S3991XA Unspecified injury of abdomen, initial encounter: Secondary | ICD-10-CM | POA: Diagnosis present

## 2021-11-12 LAB — PREGNANCY, URINE: Preg Test, Ur: NEGATIVE

## 2021-11-12 LAB — CBC WITH DIFFERENTIAL/PLATELET
Abs Immature Granulocytes: 0.05 10*3/uL (ref 0.00–0.07)
Basophils Absolute: 0.1 10*3/uL (ref 0.0–0.1)
Basophils Relative: 1 %
Eosinophils Absolute: 0.1 10*3/uL (ref 0.0–0.5)
Eosinophils Relative: 1 %
HCT: 33.8 % — ABNORMAL LOW (ref 36.0–46.0)
Hemoglobin: 10.5 g/dL — ABNORMAL LOW (ref 12.0–15.0)
Immature Granulocytes: 1 %
Lymphocytes Relative: 37 %
Lymphs Abs: 3.1 10*3/uL (ref 0.7–4.0)
MCH: 24.9 pg — ABNORMAL LOW (ref 26.0–34.0)
MCHC: 31.1 g/dL (ref 30.0–36.0)
MCV: 80.3 fL (ref 80.0–100.0)
Monocytes Absolute: 0.5 10*3/uL (ref 0.1–1.0)
Monocytes Relative: 6 %
Neutro Abs: 4.6 10*3/uL (ref 1.7–7.7)
Neutrophils Relative %: 54 %
Platelets: 360 10*3/uL (ref 150–400)
RBC: 4.21 MIL/uL (ref 3.87–5.11)
RDW: 14.4 % (ref 11.5–15.5)
WBC: 8.4 10*3/uL (ref 4.0–10.5)
nRBC: 0 % (ref 0.0–0.2)

## 2021-11-12 LAB — URINALYSIS, ROUTINE W REFLEX MICROSCOPIC
Bilirubin Urine: NEGATIVE
Glucose, UA: NEGATIVE mg/dL
Hgb urine dipstick: NEGATIVE
Ketones, ur: 5 mg/dL — AB
Leukocytes,Ua: NEGATIVE
Nitrite: NEGATIVE
Protein, ur: 30 mg/dL — AB
Specific Gravity, Urine: 1.042 — ABNORMAL HIGH (ref 1.005–1.030)
pH: 5 (ref 5.0–8.0)

## 2021-11-12 LAB — COMPREHENSIVE METABOLIC PANEL
ALT: 15 U/L (ref 0–44)
AST: 32 U/L (ref 15–41)
Albumin: 4.1 g/dL (ref 3.5–5.0)
Alkaline Phosphatase: 40 U/L (ref 38–126)
Anion gap: 9 (ref 5–15)
BUN: 19 mg/dL (ref 6–20)
CO2: 22 mmol/L (ref 22–32)
Calcium: 8.9 mg/dL (ref 8.9–10.3)
Chloride: 107 mmol/L (ref 98–111)
Creatinine, Ser: 0.57 mg/dL (ref 0.44–1.00)
GFR, Estimated: >60 mL/min (ref >60)
Glucose, Bld: 88 mg/dL (ref 70–99)
Potassium: 3.8 mmol/L (ref 3.5–5.1)
Sodium: 138 mmol/L (ref 135–145)
Total Bilirubin: 0.5 mg/dL (ref 0.3–1.2)
Total Protein: 7.4 g/dL (ref 6.5–8.1)

## 2021-11-12 LAB — HCG, QUANTITATIVE, PREGNANCY: hCG, Beta Chain, Quant, S: <1 m[IU]/mL (ref <5)

## 2021-11-12 LAB — LIPASE, BLOOD: Lipase: 54 U/L — ABNORMAL HIGH (ref 11–51)

## 2021-11-12 LAB — ACETAMINOPHEN LEVEL: Acetaminophen (Tylenol), Serum: <10 ug/mL — ABNORMAL LOW (ref 10–30)

## 2021-11-12 MED ORDER — LIDOCAINE 5 % EX PTCH: 1.0000 | MEDICATED_PATCH | Freq: Two times a day (BID) | CUTANEOUS | 0 refills | Status: AC

## 2021-11-12 MED ORDER — IOHEXOL 300 MG/ML  SOLN
100.0000 mL | Freq: Once | INTRAMUSCULAR | Status: AC | PRN
  Administered 2021-11-12: 100 mL via INTRAVENOUS

## 2021-11-12 MED ORDER — LIDOCAINE 5 % EX PTCH
1.0000 | MEDICATED_PATCH | CUTANEOUS | Status: DC
  Administered 2021-11-12: 1 via TRANSDERMAL
  Filled 2021-11-12: qty 1

## 2021-11-12 NOTE — Discharge Instructions (Addendum)
Your CT scan is as below.  The surgery team came and evaluated you and did not feel like it was appendicitis so they recommended returning if you develop fevers, worsening pain or any other concerns.  At this time you could continue taking lidocaine patches to help with pain as well as alternating some Tylenol and ibuprofen but make sure you are not taking more than directed on the box.     1. Abnormal appearance of the distal appendix, with mild enlargement and multiple appendicoliths within. No convincing evidence of surrounding edema. Considerations include low-grade, chronic appendicitis or an underlying mucocele. Normal variation felt less likely. Correlate with acute/subacute localizing symptoms. Consider surgical consultation. 2. No other explanation for abdominal pain.

## 2021-11-12 NOTE — ED Provider Notes (Signed)
[facility] Provider Note    Event Date/Time   First MD Initiated Contact with Patient 11/12/21 [phone]     (approximate)   History   Abdominal Pain   HPI  Sonya Spencer is a 36 y.o. female  with anemia, anxiety, asthma, gastric reflux, prior C-sections who comes in with concerns for abdominal pain.  Patient is already had her gallbladder removed as well.  Patient reports about a week ago she was working at [employer] and one of the stands hit her in the stomach.  She reports pain since then that is been getting worse in her right lower quadrant.  She went over to urgent care who told her to come to the ER to rule out appendicitis.  She denies any fevers, coughing or dysuria.  Denies any history of ovarian issues.  She reports a lot of scar tissue.  She does state that she has been taking some combo Tylenol ibuprofen pills.  She does not feel like she is been taking more than 4 g of Tylenol in a day.  She took her last dose this morning.       Physical Exam   Triage Vital Signs: ED Triage Vitals  Enc Vitals Group     BP 11/12/21 0823 (!) 116/51     Pulse Rate 11/12/21 0823 78     Resp 11/12/21 0823 17     Temp 11/12/21 0823 97.7 F (36.5 C)     Temp Source 11/12/21 0823 Oral     SpO2 11/12/21 0823 99 %     Weight 11/12/21 0820 219 lb (99.3 kg)     Height 11/12/21 0820 5\' 3"  (1.6 m)     Head Circumference --      Peak Flow --      Pain Score 11/12/21 0820 8     Pain Loc --      Pain Edu? --      Excl. in [place]? --     Most recent vital signs: Vitals:   11/12/21 0823  BP: (!) 116/51  Pulse: 78  Resp: 17  Temp: 97.7 F (36.5 C)  SpO2: 99%     General: Awake, no distress.  CV:  Good peripheral perfusion.  Resp:  Normal effort.  Abd:  No distention. Tender in the RLQ no rebound, no guarding Other:     ED Results / Procedures / Treatments   Labs (all labs ordered are listed, but only abnormal results are displayed) Labs Reviewed  CBC  WITH DIFFERENTIAL/PLATELET - Abnormal; Notable for the following components:      Result Value   Hemoglobin 10.5 (*)    HCT 33.8 (*)    MCH 24.9 (*)    All other components within normal limits  LIPASE, BLOOD - Abnormal; Notable for the following components:   Lipase 54 (*)    All other components within normal limits  ACETAMINOPHEN LEVEL - Abnormal; Notable for the following components:   Acetaminophen (Tylenol), Serum <10 (*)    All other components within normal limits  URINALYSIS, ROUTINE W REFLEX MICROSCOPIC - Abnormal; Notable for the following components:   Color, Urine YELLOW (*)    APPearance HAZY (*)    Specific Gravity, Urine 1.042 (*)    Ketones, ur 5 (*)    Protein, ur 30 (*)    Bacteria, UA RARE (*)    All other components within normal limits  COMPREHENSIVE METABOLIC PANEL  PREGNANCY, URINE  HCG, QUANTITATIVE, PREGNANCY  POC URINE PREG, ED     RADIOLOGY I have reviewed the CT personally and agree with radiology read slightly dilated appendix  1. Abnormal appearance of the distal appendix, with mild enlargement and multiple appendicoliths within. No convincing evidence of surrounding edema. Considerations include low-grade, chronic appendicitis or an underlying mucocele. Normal variation felt less likely. Correlate with acute/subacute localizing symptoms. Consider surgical consultation. 2. No other explanation for abdominal pain.   PROCEDURES:  Critical Care performed: No  Procedures   MEDICATIONS ORDERED IN ED: Medications  lidocaine (LIDODERM) 5 % 1 patch (1 patch Transdermal Patch Applied 11/12/21 0918)  iohexol (OMNIPAQUE) 300 MG/ML solution 100 mL (100 mLs Intravenous Contrast Given 11/12/21 1023)     IMPRESSION / MDM / ASSESSMENT AND PLAN / ED COURSE  I reviewed the triage vital signs and the nursing notes.                              Differential diagnosis includes, but is not limited to, appendicitis, ovarian pathology, contusion from  being hit in her stomach.  Initially I was concerned about potentially taking too much Tylenol given she is taking a combo pill but it sounds like she is not taking more than 16 pills a day and I looked up the pill and it has 250 mg of Tylenol in each pill.  We will double check and get liver function test and Tylenol level but seems less likely to be a chronic congestion.  We will get CT imaging to evaluate for her appendix.  Seems less likely to be torsion.  Pregnancy test is negative UA without evidence of UTI CBC with slightly low white count similar to prior CMP overall reassuring with normal liver function test Lipase slightly elevated but, nonspecific Tylenol level was negative   CT scan concerning for dilated appendix but no other signs appendicitis.  Did consult surgery who came down to evaluate patient did not feel that this represented appendicitis.  Recommended holding off on surgery which patient is agreeable to.  We discussed safe dosing of Tylenol and using some lidocaine patches to help with pain.  She denies any vaginal discharge or concerns for STDs declines pelvic exam.  I suspect that this pain is more likely from the contusion of getting hit in her abdomen.  At this time she understands return precautions in regards to appendicitis and feels comfortable with discharge home  I discussed the provisional nature of ED diagnosis, the treatment so far, the ongoing plan of care, follow up appointments and return precautions with the patient and any family or support people present. They expressed understanding and agreed with the plan, discharged home.         FINAL CLINICAL IMPRESSION(S) / ED DIAGNOSES   Final diagnoses:  Contusion of abdominal wall, initial encounter     Rx / DC Orders   ED Discharge Orders          Ordered    lidocaine (LIDODERM) 5 %  Every 12 hours        11/12/21 1232             Note:  This document was prepared using Dragon voice  recognition software and may include unintentional dictation errors.   Vanessa Polo, MD 11/12/21 1233

## 2021-11-12 NOTE — ED Notes (Signed)
Pt presents to ED with c/o of having RLQ paint hat has been ongoing for 1 week. Pt denies fevers or chills. Pt denies N/V/D. Pt denies any chance of being pregnant. Pt denies urinary symptoms.

## 2021-11-12 NOTE — ED Triage Notes (Signed)
Pt to ED via POV, pt states that she was sent from urgent care because she has been having RLQ abdominal pain x 1 week. They recommended an ultrasound. Pt is in NAD.

## 2021-11-12 NOTE — Consult Note (Signed)
[facility] SURGICAL CONSULTATION NOTE (initial) - cpt: [phone]   HISTORY OF PRESENT ILLNESS (HPI):  36 y.o. female presented to [facility] ED today for evaluation of abdominal pain. Patient reports that around 10 days ago, she was at work and was struck in the [place] by by the grocery bag apparatus at [employer]. Since that time, she has had relatively constant pain in this area and near the right ASIS. She has tried tylenol, ibuprofen, and pain patches without significant improvement. Other than the pain, she denied any fever, chills, decreased appetite, CP, SOB, nausea, emesis, urinary changes, or bowel changes. She is able to tolerate PO without issue. No history of similar in the past. She has had a cholecystectomy and c-section previously. Work up in the ED was very reassuring from a Nurse, adult. She was without leukocytosis with normal WBC at 8.4K. BMP was unremarkable. She did have CT Abdomen/Pelvis which showed mildly dilated appendix without any other findings suggestive of appendicitis   Surgery is consulted by emergency medicine physician Dr. Marjean Donna, MD in this context to rule out appendicitis.    PAST MEDICAL HISTORY (PMH):  Past Medical History:  Diagnosis Date   Abnormal glucose tolerance test (GTT) during pregnancy, antepartum 12/23/2017   - pt check BG log.   - Lifestyles referral for presumptive GDM (also due to inordinate weight loss early in pregnancy) - will treat as GDM.  Patient has refused 3 hour gtt.     Allergy    Anemia    with pregnancy only   Anxiety    Asthma    only with pregnancy   Cholelithiasis    Complication of anesthesia    panic attack with first panic   Depression    Fetal macrosomia during pregnancy in third trimester 03/25/2015   Resolved with delivery    Frequent headaches    migraines with pregnancy   GERD (gastroesophageal reflux disease)    Gestational diabetes    Gestational diabetes mellitus (GDM) affecting third pregnancy  02/05/2018   H/O maternal third degree perineal laceration, currently pregnant    History of Papanicolaou smear of cervix 10/10/11; 08/02/14   neg, ct neg; neg ct//gc/tr neg   PONV (postoperative nausea and vomiting)    Supervision of normal pregnancy 11/09/2017   [facility] Prenatal Labs Dating  Blood type:    Genetic Screen 1 Screen:    AFP:     Quad:     NIPS: Antibody:  Anatomic [place]  Rubella:   Varicella:   GTT Early:               Third trimester:  RPR:    Rhogam  HBsAg:    TDaP vaccine                       Flu Shot: HIV:    Baby Food                                GBS:  Contraception  Pap: CBB    CS/VBAC   Support Person         Urinary incontinence      PAST SURGICAL HISTORY ([place]):  Past Surgical History:  Procedure Laterality Date   CESAREAN SECTION N/A 03/22/2015   Procedure: CESAREAN SECTION;  Surgeon: Will Bonnet, MD;  Location: [facility] ORS;  Service: Obstetrics;  Laterality: N/A;   CESAREAN SECTION WITH BILATERAL TUBAL  LIGATION N/A 06/24/2018   Procedure: CESAREAN SECTION WITH BILATERAL TUBAL LIGATION;  Surgeon: Will Bonnet, MD;  Location: [facility] ORS;  Service: Obstetrics;  Laterality: N/A;   CHOLECYSTECTOMY N/A 10/11/2018   Procedure: LAPAROSCOPIC CHOLECYSTECTOMY;  Surgeon: Vickie Epley, MD;  Location: [facility] ORS;  Service: General;  Laterality: N/A;   FOREIGN BODY REMOVAL       MEDICATIONS:  Prior to Admission medications   Medication Sig Start Date End Date Taking? Authorizing Provider  albuterol (VENTOLIN HFA) 108 (90 Base) MCG/ACT inhaler TAKE 2 PUFFS BY MOUTH EVERY 6 HOURS AS NEEDED FOR WHEEZE 12/12/18   [provider]  buPROPion (WELLBUTRIN XL) 300 MG 24 hr tablet Take 1 tablet (300 mg total) by mouth at bedtime. 11/08/20   Karamalegos, Devonne Doughty, DO  cetirizine (ZYRTEC) 10 MG tablet Take 1 tablet (10 mg total) by mouth every evening. 10/06/19   Karamalegos, Devonne Doughty, DO  citalopram (CELEXA) 10 MG tablet Take 1 tablet (10 mg total) by mouth daily.  01/01/21   Kathrine Haddock, NP  omeprazole (PRILOSEC) 20 MG capsule TAKE 1 CAPSULE BY MOUTH ONCE DAILY Patient taking differently: Take 20 mg by mouth as needed. 03/10/19   Karamalegos, Devonne Doughty, DO  ondansetron (ZOFRAN ODT) 4 MG disintegrating tablet Take 1 tablet (4 mg total) by mouth every 8 (eight) hours as needed. 06/22/19   Marylene Land, NP     ALLERGIES:  Allergies  Allergen Reactions   Nickel Rash   Red Dye Rash    Includes Benadryl as it has red dye in it.     SOCIAL HISTORY:  Social History   Socioeconomic History   Marital status: Married    Spouse name: Not on file   Number of children: 3   Years of education: 14   Highest education level: Not on file  Occupational History   Occupation: HOMEMAKER  Tobacco Use   Smoking status: Former    Packs/day: 0.25    Years: 2.00    Pack years: 0.50    Types: Cigarettes    Quit date: 03/13/2009    Years since quitting: 12.6   Smokeless tobacco: Never  Vaping Use   Vaping Use: Never used  Substance and Sexual Activity   Alcohol use: Not Currently    Alcohol/week: 3.0 standard drinks    Types: 3 Shots of liquor per week    Comment: x 3 daily    Drug use: No   Sexual activity: Yes    Birth control/protection: Surgical    Comment: Tubal Ligation  Other Topics Concern   Not on file  Social History Narrative   Not on file   Social Determinants of Health   Financial Resource Strain: Not on file  Food Insecurity: Not on file  Transportation Needs: Not on file  Physical Activity: Not on file  Stress: Not on file  Social Connections: Not on file  Intimate Partner Violence: Not on file     FAMILY HISTORY:  Family History  Problem Relation Age of Onset   Lupus Mother    Hepatitis C Mother    Depression Mother    Diabetes type I Father    Diabetes Father        type 1   Thyroid disease Father    Lupus Sister    Lupus Maternal Grandmother    Diabetes Maternal Grandmother    Diabetes type I Daughter     Diabetes Daughter    Lupus Maternal Aunt    Diabetes  Paternal Grandfather        type 1   Diabetes Paternal Aunt        type 1 - runs in father's side      REVIEW OF SYSTEMS:  Review of Systems  Constitutional:  Negative for chills and fever.  HENT:  Negative for congestion and sore throat.   Respiratory:  Negative for cough and shortness of breath.   Cardiovascular:  Negative for chest pain and palpitations.  Gastrointestinal:  Positive for abdominal pain. Negative for diarrhea, nausea and vomiting.  Genitourinary:  Negative for dysuria and urgency.  All other systems reviewed and are negative.  VITAL SIGNS:  Temp:  [97.7 F (36.5 C)] 97.7 F (36.5 C) (02/14 0823) Pulse Rate:  [78-85] 85 (02/14 1000) Resp:  [17-19] 17 (02/14 1000) BP: (111-116)/(51-80) 111/68 (02/14 1000) SpO2:  [99 %] 99 % (02/14 1000) Weight:  [99.3 kg] 99.3 kg (02/14 0820)     Height: 5\' 3"  (160 cm) Weight: 99.3 kg BMI (Calculated): 38.8   INTAKE/OUTPUT:  No intake/output data recorded.  PHYSICAL EXAM:  Physical Exam Vitals and nursing note reviewed. Exam conducted with a chaperone present.  Constitutional:      General: She is not in acute distress.    Appearance: She is well-developed. She is obese. She is not ill-appearing.  HENT:     Head: Normocephalic and atraumatic.  Eyes:     General: No scleral icterus.    Extraocular Movements: Extraocular movements intact.  Cardiovascular:     Rate and Rhythm: Normal rate and regular rhythm.     Heart sounds: Normal heart sounds. No murmur heard. Pulmonary:     Effort: Pulmonary effort is normal. No respiratory distress.     Breath sounds: Normal breath sounds.  Abdominal:     General: A surgical scar is present. There is no distension.     Palpations: Abdomen is soft.     Tenderness: There is abdominal tenderness in the right lower quadrant. There is no guarding or rebound. Negative signs include Rovsing's sign, McBurney's sign and psoas sign.      Comments: Abdomen is soft, she is certainly sore in the RLQ near ASIS, her appendix on CT appears to be more lateral and towards the flank and she is without tenderness in this area, non-distend, no rebound/guarding. She is certainly without peritonitis. Psaos, Rovsing's, and McBurney's all negative.   Genitourinary:    Comments: Deferred Skin:    General: Skin is warm and dry.     Coloration: Skin is not jaundiced.     Findings: No erythema.  Neurological:     General: No focal deficit present.     Mental Status: She is alert and oriented to person, place, and time.  Psychiatric:        Mood and Affect: Mood normal.        Behavior: Behavior normal.     Labs:  CBC Latest Ref Rng & Units 11/12/2021 07/16/2021 05/09/2020  WBC 4.0 - 10.5 K/uL 8.4 11.7(H) 11.0(H)  Hemoglobin 12.0 - 15.0 g/dL 10.5(L) 10.9(L) 11.9(L)  Hematocrit 36.0 - 46.0 % 33.8(L) 33.8(L) 36.4  Platelets 150 - 400 K/uL 360 400 388   CMP Latest Ref Rng & Units 11/12/2021 07/16/2021 05/09/2020  Glucose 70 - 99 mg/dL 88 105(H) 104(H)  BUN 6 - 20 mg/dL 19 16 14   Creatinine 0.44 - 1.00 mg/dL 0.57 0.76 0.89  Sodium 135 - 145 mmol/L 138 135 137  Potassium 3.5 - 5.1 mmol/L 3.8  4.1 3.9  Chloride 98 - 111 mmol/L 107 104 103  CO2 22 - 32 mmol/L 22 24 24   Calcium 8.9 - 10.3 mg/dL 8.9 9.0 9.3  Total Protein 6.5 - 8.1 g/dL 7.4 - 7.9  Total Bilirubin 0.3 - 1.2 mg/dL 0.5 - 0.5  Alkaline Phos 38 - 126 U/L 40 - 66  AST 15 - 41 U/L 32 - 38  ALT 0 - 44 U/L 15 - 23     Imaging studies:   CT Abdomen/Pelvis (11/12/2021) personally reviewed which shows a very mildly dilated appendix without any inflammation, fluid, or air, likely incidental finding. No other acute intra-abdominal findings, and radiologist report reviewed below:  IMPRESSION: 1. Abnormal appearance of the distal appendix, with mild enlargement and multiple appendicoliths within. No convincing evidence of surrounding edema. Considerations include low-grade,  chronic appendicitis or an underlying mucocele. Normal variation felt less likely. Correlate with acute/subacute localizing symptoms. Consider surgical consultation. 2. No other explanation for abdominal pain.   Assessment/Plan: (ICD-10's: R10.31) 36 y.o. female with a 10 day history of RLQ most likely secondary to injury at work, found to incidentally have minimally dilated appendix on CT without any evidence of inflammation, abscess, or perforation   - She is certainly sore in the RLQ; however, her appendix is more cephalad and lateral to the area of her pain on imaging. Additionally, all other physical examination signs for appendicitis (Psaos, Rovsing's, McBurney's) are negative. She has a normal WBC as well and is otherwise asymptomatic. If she has had appendicitis for the last 10 days, I would expect to see more significant changes. My clinical suspicion for this is very low. I did discuss potential role for diagnostic laparoscopy and appendectomy in this setting despite the low clinical suspicion, but she prefers to avoid surgery regardless, which is reasonable. She understands that should she develop fever, nausea, emesis, or worsening pain then she should present to the ED. She understands. All questions.concerns were addressed and answered.   All of the above findings and recommendations were discussed with the patient, and all of patient's questions were answered to her expressed satisfaction.  Thank you for the opportunity to participate in this patient's care.   -- Edison Simon, PA-C  [facility] 11/12/2021, 11:50 AM [phone] M-F: 7am - 4pm

## 2021-12-04 ENCOUNTER — Other Ambulatory Visit: Payer: Self-pay

## 2021-12-04 ENCOUNTER — Emergency Department
Admission: EM | Admit: 2021-12-04 | Discharge: 2021-12-05 | Disposition: A | Discharge status: Home / Self Care | Payer: Commercial Managed Care - PPO | Attending: Emergency Medicine | Admitting: Emergency Medicine

## 2021-12-04 ENCOUNTER — Emergency Department: Payer: Commercial Managed Care - PPO

## 2021-12-04 DIAGNOSIS — E860 Dehydration: Secondary | ICD-10-CM | POA: Insufficient documentation

## 2021-12-04 DIAGNOSIS — Z7951 Long term (current) use of inhaled steroids: Secondary | ICD-10-CM | POA: Insufficient documentation

## 2021-12-04 DIAGNOSIS — D72829 Elevated white blood cell count, unspecified: Secondary | ICD-10-CM | POA: Insufficient documentation

## 2021-12-04 DIAGNOSIS — J45909 Unspecified asthma, uncomplicated: Secondary | ICD-10-CM | POA: Diagnosis not present

## 2021-12-04 DIAGNOSIS — R109 Unspecified abdominal pain: Secondary | ICD-10-CM | POA: Diagnosis not present

## 2021-12-04 DIAGNOSIS — R464 Slowness and poor responsiveness: Secondary | ICD-10-CM | POA: Diagnosis not present

## 2021-12-04 DIAGNOSIS — R519 Headache, unspecified: Secondary | ICD-10-CM | POA: Insufficient documentation

## 2021-12-04 DIAGNOSIS — R112 Nausea with vomiting, unspecified: Secondary | ICD-10-CM | POA: Diagnosis not present

## 2021-12-04 DIAGNOSIS — R079 Chest pain, unspecified: Secondary | ICD-10-CM | POA: Insufficient documentation

## 2021-12-04 DIAGNOSIS — Z20822 Contact with and (suspected) exposure to covid-19: Secondary | ICD-10-CM | POA: Insufficient documentation

## 2021-12-04 DIAGNOSIS — R55 Syncope and collapse: Secondary | ICD-10-CM | POA: Diagnosis not present

## 2021-12-04 DIAGNOSIS — K219 Gastro-esophageal reflux disease without esophagitis: Secondary | ICD-10-CM | POA: Diagnosis not present

## 2021-12-04 LAB — CBC
HCT: 35.3 % — ABNORMAL LOW (ref 36.0–46.0)
Hemoglobin: 10.9 g/dL — ABNORMAL LOW (ref 12.0–15.0)
MCH: 24.9 pg — ABNORMAL LOW (ref 26.0–34.0)
MCHC: 30.9 g/dL (ref 30.0–36.0)
MCV: 80.6 fL (ref 80.0–100.0)
Platelets: 362 10*3/uL (ref 150–400)
RBC: 4.38 MIL/uL (ref 3.87–5.11)
RDW: 14.3 % (ref 11.5–15.5)
WBC: 11.8 10*3/uL — ABNORMAL HIGH (ref 4.0–10.5)
nRBC: 0 % (ref 0.0–0.2)

## 2021-12-04 LAB — BASIC METABOLIC PANEL
Anion gap: 8 (ref 5–15)
BUN: 18 mg/dL (ref 6–20)
CO2: 23 mmol/L (ref 22–32)
Calcium: 8.9 mg/dL (ref 8.9–10.3)
Chloride: 108 mmol/L (ref 98–111)
Creatinine, Ser: 0.67 mg/dL (ref 0.44–1.00)
GFR, Estimated: >60 mL/min (ref >60)
Glucose, Bld: 91 mg/dL (ref 70–99)
Potassium: 3.6 mmol/L (ref 3.5–5.1)
Sodium: 139 mmol/L (ref 135–145)

## 2021-12-04 LAB — ACETAMINOPHEN LEVEL: Acetaminophen (Tylenol), Serum: 12 ug/mL (ref 10–30)

## 2021-12-04 LAB — SALICYLATE LEVEL: Salicylate Lvl: <7 mg/dL — ABNORMAL LOW (ref 7.0–30.0)

## 2021-12-04 MED ORDER — IOHEXOL 350 MG/ML SOLN
100.0000 mL | Freq: Once | INTRAVENOUS | Status: AC | PRN
  Administered 2021-12-04: 100 mL via INTRAVENOUS

## 2021-12-04 MED ORDER — METOCLOPRAMIDE HCL 5 MG/ML IJ SOLN
5.0000 mg | Freq: Once | INTRAMUSCULAR | Status: AC
  Administered 2021-12-04: 5 mg via INTRAVENOUS
  Filled 2021-12-04: qty 2

## 2021-12-04 MED ORDER — SODIUM CHLORIDE 0.9 % IV BOLUS
1000.0000 mL | Freq: Once | INTRAVENOUS | Status: AC
  Administered 2021-12-04: 1000 mL via INTRAVENOUS

## 2021-12-04 NOTE — ED Provider Notes (Signed)
[facility] Provider Note    Event Date/Time   First MD Initiated Contact with Patient 12/04/21 2304     (approximate)   History   Loss of Consciousness   HPI  Sonya Spencer is a 36 y.o. female brought to the ED via EMS from work at [employer] with syncopal episode.  Patient was found unresponsive in the bathroom at work.  Does not remember events.  States that she was feeling poorly prior to the onset of her shift with chest, abdominal pain and headache.  Denies recent fever, chills, cough, shortness of breath, vomiting or diarrhea.  Denies recent trauma, travel or hormone use.     Past Medical History   Past Medical History:  Diagnosis Date   Abnormal glucose tolerance test (GTT) during pregnancy, antepartum 12/23/2017   - pt check BG log.   - Lifestyles referral for presumptive GDM (also due to inordinate weight loss early in pregnancy) - will treat as GDM.  Patient has refused 3 hour gtt.     Allergy    Anemia    with pregnancy only   Anxiety    Asthma    only with pregnancy   Cholelithiasis    Complication of anesthesia    panic attack with first panic   Depression    Fetal macrosomia during pregnancy in third trimester 03/25/2015   Resolved with delivery    Frequent headaches    migraines with pregnancy   GERD (gastroesophageal reflux disease)    Gestational diabetes    Gestational diabetes mellitus (GDM) affecting third pregnancy 02/05/2018   H/O maternal third degree perineal laceration, currently pregnant    History of Papanicolaou smear of cervix 10/10/11; 08/02/14   neg, ct neg; neg ct//gc/tr neg   PONV (postoperative nausea and vomiting)    Supervision of normal pregnancy 11/09/2017   Clinic [facility] Prenatal Labs Dating  Blood type:    Genetic Screen 1 Screen:    AFP:     Quad:     NIPS: Antibody:  Anatomic [place]  Rubella:   Varicella:   GTT Early:               Third trimester:  RPR:    Rhogam  HBsAg:    TDaP vaccine                        Flu Shot: HIV:    Baby Food                                GBS:  Contraception  Pap: CBB    CS/VBAC   Support Person         Urinary incontinence      Active Problem List   Patient Active Problem List   Diagnosis Date Noted   Anorexia 05/01/2020   ETOH abuse 05/01/2020   Low-lying placenta 05/12/2018   History of macrosomia in infant in prior pregnancy, currently pregnant 11/09/2017   History of cesarean delivery 11/09/2017   Lipoma of axilla 06/22/2017   Anxiety and depression 05/11/2017   Frequent headaches 05/11/2017     Past Surgical History   Past Surgical History:  Procedure Laterality Date   CESAREAN SECTION N/A 03/22/2015   Procedure: CESAREAN SECTION;  Surgeon: Will Bonnet, MD;  Location: [facility] ORS;  Service: Obstetrics;  Laterality: N/A;   CESAREAN SECTION WITH BILATERAL TUBAL LIGATION N/A 06/24/2018  Procedure: CESAREAN SECTION WITH BILATERAL TUBAL LIGATION;  Surgeon: Will Bonnet, MD;  Location: [facility] ORS;  Service: Obstetrics;  Laterality: N/A;   CHOLECYSTECTOMY N/A 10/11/2018   Procedure: LAPAROSCOPIC CHOLECYSTECTOMY;  Surgeon: Vickie Epley, MD;  Location: [facility] ORS;  Service: General;  Laterality: N/A;   FOREIGN BODY REMOVAL       Home Medications   Prior to Admission medications   Medication Sig Start Date End Date Taking? Authorizing Provider  ondansetron (ZOFRAN-ODT) 4 MG disintegrating tablet Take 1 tablet (4 mg total) by mouth every 8 (eight) hours as needed for nausea or vomiting. 12/05/21  Yes Paulette Blanch, MD  albuterol (VENTOLIN HFA) 108 (90 Base) MCG/ACT inhaler TAKE 2 PUFFS BY MOUTH EVERY 6 HOURS AS NEEDED FOR WHEEZE 12/12/18   [provider]  buPROPion (WELLBUTRIN XL) 300 MG 24 hr tablet Take 1 tablet (300 mg total) by mouth at bedtime. 11/08/20   Karamalegos, Devonne Doughty, DO  cetirizine (ZYRTEC) 10 MG tablet Take 1 tablet (10 mg total) by mouth every evening. 10/06/19   Karamalegos, Devonne Doughty, DO  citalopram (CELEXA) 10 MG tablet  Take 1 tablet (10 mg total) by mouth daily. 01/01/21   Kathrine Haddock, NP  omeprazole (PRILOSEC) 20 MG capsule TAKE 1 CAPSULE BY MOUTH ONCE DAILY Patient taking differently: Take 20 mg by mouth as needed. 03/10/19   Karamalegos, Devonne Doughty, DO     Allergies  Nickel and Red dye   Family History   Family History  Problem Relation Age of Onset   Lupus Mother    Hepatitis C Mother    Depression Mother    Diabetes type I Father    Diabetes Father        type 1   Thyroid disease Father    Lupus Sister    Lupus Maternal Grandmother    Diabetes Maternal Grandmother    Diabetes type I Daughter    Diabetes Daughter    Lupus Maternal Aunt    Diabetes Paternal Grandfather        type 1   Diabetes Paternal Aunt        type 1 - runs in father's side     Physical Exam  Triage Vital Signs: ED Triage Vitals  Enc Vitals Group     BP 12/04/21 2211 124/69     Pulse Rate 12/04/21 2211 81     Resp 12/04/21 2211 16     Temp 12/04/21 2211 98.2 F (36.8 C)     Temp Source 12/04/21 2211 Oral     SpO2 12/04/21 2211 98 %     Weight 12/04/21 2212 218 lb 4.1 oz (99 kg)     Height 12/04/21 2212 '5\' 3"'$  (1.6 m)     Head Circumference --      Peak Flow --      Pain Score 12/04/21 2212 8     Pain Loc --      Pain Edu? --      Excl. in [place]? --     Updated Vital Signs: BP 107/76    Pulse 70    Temp 98.2 F (36.8 C) (Oral)    Resp 14    Ht '5\' 3"'$  (1.6 m)    Wt 99 kg    LMP  (LMP Unknown)    SpO2 100%    BMI 38.66 kg/m    General: Awake, mild distress.  Actively vomiting. CV:  RRR.  Good peripheral perfusion.  Resp:  Normal effort.  CTA B. Abd:  Mild diffuse tenderness to palpation without rebound or guarding no distention.  Other:  Alert and oriented to person and place.  CN II toXII grossly intact.  5/5 motor strength and sensation all extremities. MAEx4.  Did not tongue.  Did not have urinary incontinence   ED Results / Procedures / Treatments  Labs (all labs ordered are listed, but only  abnormal results are displayed) Labs Reviewed  CBC - Abnormal; Notable for the following components:      Result Value   WBC 11.8 (*)    Hemoglobin 10.9 (*)    HCT 35.3 (*)    MCH 24.9 (*)    All other components within normal limits  URINALYSIS, ROUTINE W REFLEX MICROSCOPIC - Abnormal; Notable for the following components:   Color, Urine YELLOW (*)    APPearance HAZY (*)    Specific Gravity, Urine 1.041 (*)    Ketones, ur 5 (*)    Protein, ur 30 (*)    All other components within normal limits  HEPATIC FUNCTION PANEL - Abnormal; Notable for the following components:   Alkaline Phosphatase 33 (*)    Total Bilirubin 0.2 (*)    All other components within normal limits  SALICYLATE LEVEL - Abnormal; Notable for the following components:   Salicylate Lvl <0.2 (*)    All other components within normal limits  RESP PANEL BY RT-PCR (FLU A&B, COVID) ARPGX2  BASIC METABOLIC PANEL  LIPASE, BLOOD  ACETAMINOPHEN LEVEL  URINE DRUG SCREEN, QUALITATIVE ([facility] ONLY)  PREGNANCY, URINE  CBG MONITORING, ED  POC URINE PREG, ED  TROPONIN I (HIGH SENSITIVITY)  TROPONIN I (HIGH SENSITIVITY)     EKG  ED ECG REPORT I, Jaynia Fendley J, the attending physician, personally viewed and interpreted this ECG.   Date: 12/04/2021  EKG Time: 2215  Rate: 75  Rhythm: normal sinus rhythm  Axis: Normal  Intervals:none  ST&T Change: Nonspecific    RADIOLOGY I have independently visualized and reviewed patient's CT head, chest/abdomen/pelvis as well as noted the radiology interpretation:  CT head: No ICH  CT chest/abdomen/pelvis: No acute abnormality  Official radiology report(s): CT Head Wo Contrast  Result Date: 12/04/2021 CLINICAL DATA:  Patient found unresponsive. EXAM: CT HEAD WITHOUT CONTRAST TECHNIQUE: Contiguous axial images were obtained from the base of the skull through the vertex without intravenous contrast. RADIATION DOSE REDUCTION: This exam was performed according to the departmental  dose-optimization program which includes automated exposure control, adjustment of the mA and/or kV according to patient size and/or use of iterative reconstruction technique. COMPARISON:  March 01, 2020 FINDINGS: Brain: No evidence of acute infarction, hemorrhage, hydrocephalus, extra-axial collection or mass lesion/mass effect. Vascular: No hyperdense vessel or unexpected calcification. Skull: Normal. Negative for fracture or focal lesion. Sinuses/Orbits: No acute finding. Other: None. IMPRESSION: No acute intracranial pathology. Electronically Signed   By: Virgina Norfolk M.D.   On: 12/04/2021 23:53   CT Angio Chest/Abd/Pel for Dissection W and/or W/WO  Result Date: 12/04/2021 CLINICAL DATA:  Patient found unresponsive. EXAM: CT ANGIOGRAPHY CHEST, ABDOMEN AND PELVIS TECHNIQUE: Non-contrast CT of the chest was initially obtained. Multidetector CT imaging through the chest, abdomen and pelvis was performed using the standard protocol during bolus administration of intravenous contrast. Multiplanar reconstructed images and MIPs were obtained and reviewed to evaluate the vascular anatomy. RADIATION DOSE REDUCTION: This exam was performed according to the departmental dose-optimization program which includes automated exposure control, adjustment of the mA and/or kV according to patient size and/or use of  iterative reconstruction technique. CONTRAST:  128m OMNIPAQUE IOHEXOL 350 MG/ML SOLN COMPARISON:  November 12, 2021 FINDINGS: CTA CHEST FINDINGS Cardiovascular: There is no evidence of aortic aneurysm or dissection. Satisfactory opacification of the pulmonary arteries to the segmental level. No evidence of pulmonary embolism. Normal heart size. No pericardial effusion. Mediastinum/Nodes: No enlarged mediastinal, hilar, or axillary lymph nodes. Thyroid gland, trachea, and esophagus demonstrate no significant findings. Lungs/Pleura: Lungs are clear. No pleural effusion or pneumothorax. Musculoskeletal: No chest  wall abnormality. No acute or significant osseous findings. Review of the MIP images confirms the above findings. CTA ABDOMEN AND PELVIS FINDINGS VASCULAR Aorta: Normal caliber aorta without aneurysm, dissection, vasculitis or significant stenosis. Celiac: Patent without evidence of aneurysm, dissection, vasculitis or significant stenosis. SMA: Patent without evidence of aneurysm, dissection, vasculitis or significant stenosis. Renals: Both renal arteries are patent without evidence of aneurysm, dissection, vasculitis, fibromuscular dysplasia or significant stenosis. IMA: Patent without evidence of aneurysm, dissection, vasculitis or significant stenosis. Inflow: Patent without evidence of aneurysm, dissection, vasculitis or significant stenosis. Veins: No obvious venous abnormality within the limitations of this arterial phase study. Review of the MIP images confirms the above findings. NON-VASCULAR Hepatobiliary: No focal liver abnormality is seen. Status post cholecystectomy. No biliary dilatation. Pancreas: Unremarkable. No pancreatic ductal dilatation or surrounding inflammatory changes. Spleen: Normal in size without focal abnormality. Adrenals/Urinary Tract: Adrenal glands are unremarkable. Kidneys are normal, without renal calculi, focal lesion, or hydronephrosis. The urinary bladder is empty and subsequently limited in evaluation. Stomach/Bowel: Stomach is within normal limits. Appendix appears normal. No evidence of bowel wall thickening, distention, or inflammatory changes. Lymphatic: No abnormal abdominal or pelvic lymph nodes are identified. Reproductive: Uterus and bilateral adnexa are unremarkable. Other: No abdominal wall hernia or abnormality. No abdominopelvic ascites. Musculoskeletal: No acute or significant osseous findings. Review of the MIP images confirms the above findings. IMPRESSION: 1. No evidence of pulmonary embolism or other acute intrathoracic process. 2. No acute or active process  within the abdomen or pelvis. 3. Status post cholecystectomy. Electronically Signed   By: TVirgina NorfolkM.D.   On: 12/04/2021 23:58     PROCEDURES:  Critical Care performed: No  .1-3 Lead EKG Interpretation Performed by: SPaulette Blanch MD Authorized by: SPaulette Blanch MD     Interpretation: normal     ECG rate:  84   ECG rate assessment: normal     Rhythm: sinus rhythm     Ectopy: none     Conduction: normal   Comments:     Patient placed on cardiac monitor to evaluate for arrhythmias   MEDICATIONS ORDERED IN ED: Medications  sodium chloride 0.9 % bolus 1,000 mL (0 mLs Intravenous Stopped 12/05/21 0220)  metoCLOPramide (REGLAN) injection 5 mg (5 mg Intravenous Given 12/04/21 2337)  iohexol (OMNIPAQUE) 350 MG/ML injection 100 mL (100 mLs Intravenous Contrast Given 12/04/21 2339)  ondansetron (ZOFRAN-ODT) disintegrating tablet 4 mg (4 mg Oral Given 12/05/21 0246)     IMPRESSION / MDM / ASSESSMENT AND PLAN / ED COURSE  I reviewed the triage vital signs and the nursing notes.                             36year old female presenting with syncopal episode.  Differential diagnosis includes but is not limited to TIA/CVA, ICH, infectious, metabolic, ACS etiologies, etc.  I have personally reviewed patient's chart and see that she had a recent PCP office visit on 11/12/2021 for abdominal pain  where she was sent to the ED for further evaluation with CT demonstrating dilated appendix without periappendiceal inflammation.  She was evaluated by general surgery who did not feel her presentation represented appendicitis.  She was diagnosed with abdominal wall contusion as she had blunt abdominal trauma.  The patient is on the cardiac monitor to evaluate for evidence of arrhythmia and/or significant heart rate changes.  Laboratory results thus far notable for minimal leukocytosis WBC 11.8, normal BMP.  Will check LFTs/lipase, troponin.  Patient mentions she takes acetaminophen and ibuprofen on a  regular basis for various aches and pains; will check these levels.  Obtain CT head, CTA chest/abdomen/pelvis for complaints of headache, chest and abdominal pain.  Administer IV fluids, IV Reglan for active vomiting.  Will reassess.  Clinical Course as of 12/05/21 0334  Thu Dec 05, 2021  0208 Patient resting in no acute distress.  Tolerated ginger ale without emesis.  Voices no complaints currently.  Updated her on all laboratory and imaging results.  Acetaminophen level unremarkable.  Mild ketonuria.  UDS negative.  Troponins x2 negative.  CT head negative for ICH, CT chest/abdomen/pelvis unremarkable for acute abnormality.  Will discharge home with as needed prescription for Zofran.  Strict return precautions given.  Patient verbalizes understanding and agrees with plan of care. [JS]    Clinical Course User Index [JS] Paulette Blanch, MD      FINAL CLINICAL IMPRESSION(S) / ED DIAGNOSES   Final diagnoses:  Syncope and collapse  Dehydration  Nausea and vomiting, unspecified vomiting type     Rx / DC Orders   ED Discharge Orders          Ordered    ondansetron (ZOFRAN-ODT) 4 MG disintegrating tablet  Every 8 hours PRN        12/05/21 0210             Note:  This document was prepared using Dragon voice recognition software and may include unintentional dictation errors.   Paulette Blanch, MD 12/05/21 [phone]

## 2021-12-04 NOTE — ED Notes (Signed)
Patient transported to CT 

## 2021-12-04 NOTE — ED Notes (Signed)
Chemistry resulted. No CBG. ?

## 2021-12-04 NOTE — ED Provider Notes (Incomplete)
[facility] Provider Note    Event Date/Time   First MD Initiated Contact with Patient 12/04/21 2304     (approximate)   History   Loss of Consciousness   HPI {Remember to add pertinent medical, surgical, social, and/or OB history to HPI:1} Sonya Spencer is a 36 y.o. female  ***       Past Medical History   Past Medical History:  Diagnosis Date   Abnormal glucose tolerance test (GTT) during pregnancy, antepartum 12/23/2017   - pt check BG log.   - Lifestyles referral for presumptive GDM (also due to inordinate weight loss early in pregnancy) - will treat as GDM.  Patient has refused 3 hour gtt.     Allergy    Anemia    with pregnancy only   Anxiety    Asthma    only with pregnancy   Cholelithiasis    Complication of anesthesia    panic attack with first panic   Depression    Fetal macrosomia during pregnancy in third trimester 03/25/2015   Resolved with delivery    Frequent headaches    migraines with pregnancy   GERD (gastroesophageal reflux disease)    Gestational diabetes    Gestational diabetes mellitus (GDM) affecting third pregnancy 02/05/2018   H/O maternal third degree perineal laceration, currently pregnant    History of Papanicolaou smear of cervix 10/10/11; 08/02/14   neg, ct neg; neg ct//gc/tr neg   PONV (postoperative nausea and vomiting)    Supervision of normal pregnancy 11/09/2017   Clinic [facility] Prenatal Labs Dating  Blood type:    Genetic Screen 1 Screen:    AFP:     Quad:     NIPS: Antibody:  Anatomic [place]  Rubella:   Varicella:   GTT Early:               Third trimester:  RPR:    Rhogam  HBsAg:    TDaP vaccine                       Flu Shot: HIV:    Baby Food                                GBS:  Contraception  Pap: CBB    CS/VBAC   Support Person         Urinary incontinence      Active Problem List   Patient Active Problem List   Diagnosis Date Noted   Anorexia 05/01/2020   ETOH abuse 05/01/2020    Low-lying placenta 05/12/2018   History of macrosomia in infant in prior pregnancy, currently pregnant 11/09/2017   History of cesarean delivery 11/09/2017   Lipoma of axilla 06/22/2017   Anxiety and depression 05/11/2017   Frequent headaches 05/11/2017     Past Surgical History   Past Surgical History:  Procedure Laterality Date   CESAREAN SECTION N/A 03/22/2015   Procedure: CESAREAN SECTION;  Surgeon: Will Bonnet, MD;  Location: [facility] ORS;  Service: Obstetrics;  Laterality: N/A;   CESAREAN SECTION WITH BILATERAL TUBAL LIGATION N/A 06/24/2018   Procedure: CESAREAN SECTION WITH BILATERAL TUBAL LIGATION;  Surgeon: Will Bonnet, MD;  Location: [facility] ORS;  Service: Obstetrics;  Laterality: N/A;   CHOLECYSTECTOMY N/A 10/11/2018   Procedure: LAPAROSCOPIC CHOLECYSTECTOMY;  Surgeon: Vickie Epley, MD;  Location: [facility] ORS;  Service: General;  Laterality: N/A;  FOREIGN BODY REMOVAL       Home Medications   Prior to Admission medications   Medication Sig Start Date End Date Taking? Authorizing Provider  albuterol (VENTOLIN HFA) 108 (90 Base) MCG/ACT inhaler TAKE 2 PUFFS BY MOUTH EVERY 6 HOURS AS NEEDED FOR WHEEZE 12/12/18   [provider]  buPROPion (WELLBUTRIN XL) 300 MG 24 hr tablet Take 1 tablet (300 mg total) by mouth at bedtime. 11/08/20   Karamalegos, Devonne Doughty, DO  cetirizine (ZYRTEC) 10 MG tablet Take 1 tablet (10 mg total) by mouth every evening. 10/06/19   Karamalegos, Devonne Doughty, DO  citalopram (CELEXA) 10 MG tablet Take 1 tablet (10 mg total) by mouth daily. 01/01/21   Kathrine Haddock, NP  omeprazole (PRILOSEC) 20 MG capsule TAKE 1 CAPSULE BY MOUTH ONCE DAILY Patient taking differently: Take 20 mg by mouth as needed. 03/10/19   Karamalegos, Devonne Doughty, DO  ondansetron (ZOFRAN ODT) 4 MG disintegrating tablet Take 1 tablet (4 mg total) by mouth every 8 (eight) hours as needed. 06/22/19   Marylene Land, NP     Allergies  Nickel and Red  dye   Family History   Family History  Problem Relation Age of Onset   Lupus Mother    Hepatitis C Mother    Depression Mother    Diabetes type I Father    Diabetes Father        type 1   Thyroid disease Father    Lupus Sister    Lupus Maternal Grandmother    Diabetes Maternal Grandmother    Diabetes type I Daughter    Diabetes Daughter    Lupus Maternal Aunt    Diabetes Paternal Grandfather        type 1   Diabetes Paternal Aunt        type 1 - runs in father's side     Physical Exam  Triage Vital Signs: ED Triage Vitals  Enc Vitals Group     BP 12/04/21 2211 124/69     Pulse Rate 12/04/21 2211 81     Resp 12/04/21 2211 16     Temp 12/04/21 2211 98.2 F (36.8 C)     Temp Source 12/04/21 2211 Oral     SpO2 12/04/21 2211 98 %     Weight 12/04/21 2212 218 lb 4.1 oz (99 kg)     Height 12/04/21 2212 '5\' 3"'$  (1.6 m)     Head Circumference --      Peak Flow --      Pain Score 12/04/21 2212 8     Pain Loc --      Pain Edu? --      Excl. in [place]? --     Updated Vital Signs: BP (!) 135/95    Pulse 84    Temp 98.2 F (36.8 C) (Oral)    Resp 19    Ht '5\' 3"'$  (1.6 m)    Wt 99 kg    LMP  (LMP Unknown)    SpO2 99%    BMI 38.66 kg/m   {Only need to document appropriate and relevant physical exam:1} General: Awake, no distress. *** CV:  Good peripheral perfusion. *** Resp:  Normal effort. *** Abd:  No distention. *** Other:  ***   ED Results / Procedures / Treatments  Labs (all labs ordered are listed, but only abnormal results are displayed) Labs Reviewed  CBC - Abnormal; Notable for the following components:      Result Value   WBC  11.8 (*)    Hemoglobin 10.9 (*)    HCT 35.3 (*)    MCH 24.9 (*)    All other components within normal limits  BASIC METABOLIC PANEL  URINALYSIS, ROUTINE W REFLEX MICROSCOPIC  CBG MONITORING, ED  POC URINE PREG, ED     EKG  ***   RADIOLOGY *** {You MUST document your own interpretation of imaging, as well as  the fact that you reviewed the radiologist's report!:1}  Official radiology report(s): No results found.   PROCEDURES:  Critical Care performed: {CriticalCareYesNo:19197::"Yes, see critical care procedure note(s)","No"}  Procedures   MEDICATIONS ORDERED IN ED: Medications - No data to display   IMPRESSION / MDM / [place] / ED COURSE  I reviewed the triage vital signs and the nursing notes.                              Differential diagnosis includes, but is not limited to, ***  {If the patient is on the monitor, remove the brackets and asterisks on the sentence below and remember to document it as a Procedure as well. Otherwise delete the sentence below:1} {**The patient is on the cardiac monitor to evaluate for evidence of arrhythmia and/or significant heart rate changes.**}  {Remember to include, when applicable, any/all of the following data: independent review of imaging independent review of labs (comment specifically on pertinent positives and negatives) review of specific prior hospitalizations, PCP/specialist notes, etc. discuss meds given and prescribed document any discussion with consultants (including hospitalists) any clinical decision tools you used and why (PECARN, NEXUS, etc.) did you consider admitting the patient? document social determinants of health affecting patient's care (homelessness, inability to follow up in a timely fashion, etc) document any pre-existing conditions increasing risk on current visit (e.g. diabetes and HTN increasing danger of high-risk chest pain/ACS) describes what meds you gave (especially parenteral) and why any other interventions?:1}  Clinical Course as of 12/04/21 2341  Wed Dec 04, 2021  2305 [facility](!): 24.9 [JS]    Clinical Course User Index [JS] Paulette Blanch, MD     FINAL CLINICAL IMPRESSION(S) / ED DIAGNOSES   Final diagnoses:  None     Rx / DC Orders   ED Discharge Orders     None         Note:  This document was prepared using Dragon voice recognition software and may include unintentional dictation errors.

## 2021-12-04 NOTE — ED Triage Notes (Signed)
Pt presents to ER via ems from work.  Pt was found unresponsive in bathroom at work.  Pt alert to verbal stimuli at this time.  Pt alert to person only at this time.  Pt endorses some chest pain, abd pain and headache at this time.  Pupils equal and reactive in triage.  Pt in NAD in triage.   ?

## 2021-12-05 LAB — HEPATIC FUNCTION PANEL
ALT: 13 U/L (ref 0–44)
AST: 31 U/L (ref 15–41)
Albumin: 4.1 g/dL (ref 3.5–5.0)
Alkaline Phosphatase: 33 U/L — ABNORMAL LOW (ref 38–126)
Bilirubin, Direct: <0.1 mg/dL (ref 0.0–0.2)
Total Bilirubin: 0.2 mg/dL — ABNORMAL LOW (ref 0.3–1.2)
Total Protein: 7.3 g/dL (ref 6.5–8.1)

## 2021-12-05 LAB — URINE DRUG SCREEN, QUALITATIVE (ARMC ONLY)
Amphetamines, Ur Screen: NOT DETECTED
Barbiturates, Ur Screen: NOT DETECTED
Benzodiazepine, Ur Scrn: NOT DETECTED
Cannabinoid 50 Ng, Ur ~~LOC~~: NOT DETECTED
Cocaine Metabolite,Ur ~~LOC~~: NOT DETECTED
MDMA (Ecstasy)Ur Screen: NOT DETECTED
Methadone Scn, Ur: NOT DETECTED
Opiate, Ur Screen: NOT DETECTED
Phencyclidine (PCP) Ur S: NOT DETECTED
Tricyclic, Ur Screen: NOT DETECTED

## 2021-12-05 LAB — URINALYSIS, ROUTINE W REFLEX MICROSCOPIC
Bacteria, UA: NONE SEEN
Bilirubin Urine: NEGATIVE
Glucose, UA: NEGATIVE mg/dL
Hgb urine dipstick: NEGATIVE
Ketones, ur: 5 mg/dL — AB
Leukocytes,Ua: NEGATIVE
Nitrite: NEGATIVE
Protein, ur: 30 mg/dL — AB
Specific Gravity, Urine: 1.041 — ABNORMAL HIGH (ref 1.005–1.030)
Squamous Epithelial / HPF: NONE SEEN (ref 0–5)
WBC, UA: NONE SEEN WBC/hpf (ref 0–5)
pH: 5 (ref 5.0–8.0)

## 2021-12-05 LAB — RESP PANEL BY RT-PCR (FLU A&B, COVID) ARPGX2
Influenza A by PCR: NEGATIVE
Influenza B by PCR: NEGATIVE
SARS Coronavirus 2 by RT PCR: NEGATIVE

## 2021-12-05 LAB — POC URINE PREG, ED: Preg Test, Ur: NEGATIVE

## 2021-12-05 LAB — LIPASE, BLOOD: Lipase: 50 U/L (ref 11–51)

## 2021-12-05 LAB — TROPONIN I (HIGH SENSITIVITY)
Troponin I (High Sensitivity): <2 ng/L (ref <18)
Troponin I (High Sensitivity): <2 ng/L (ref <18)

## 2021-12-05 LAB — PREGNANCY, URINE: Preg Test, Ur: NEGATIVE

## 2021-12-05 MED ORDER — ONDANSETRON 4 MG PO TBDP: 4.0000 mg | ORAL_TABLET | Freq: Three times a day (TID) | ORAL | 0 refills | Status: DC | PRN

## 2021-12-05 MED ORDER — ONDANSETRON 4 MG PO TBDP
4.0000 mg | ORAL_TABLET | Freq: Once | ORAL | Status: AC
  Administered 2021-12-05: 03:00:00 4 mg via ORAL
  Filled 2021-12-05: qty 1

## 2021-12-05 NOTE — Discharge Instructions (Signed)
1.  You may take Zofran as needed for nausea/vomiting. ?2.  Drink plenty of fluids daily. ?3.  Return to the ER for worsening symptoms, persistent vomiting, difficulty breathing or other concerns. ?

## 2022-04-02 ENCOUNTER — Ambulatory Visit: Payer: Commercial Managed Care - PPO | Admitting: Internal Medicine

## 2022-04-02 ENCOUNTER — Encounter: Payer: Self-pay | Admitting: Internal Medicine

## 2022-04-02 DIAGNOSIS — F32A Depression, unspecified: Secondary | ICD-10-CM

## 2022-04-02 DIAGNOSIS — E6609 Other obesity due to excess calories: Secondary | ICD-10-CM | POA: Insufficient documentation

## 2022-04-02 DIAGNOSIS — K219 Gastro-esophageal reflux disease without esophagitis: Secondary | ICD-10-CM | POA: Diagnosis not present

## 2022-04-02 DIAGNOSIS — E782 Mixed hyperlipidemia: Secondary | ICD-10-CM | POA: Diagnosis not present

## 2022-04-02 DIAGNOSIS — E66811 Other obesity due to excess calories: Secondary | ICD-10-CM | POA: Insufficient documentation

## 2022-04-02 DIAGNOSIS — R519 Headache, unspecified: Secondary | ICD-10-CM | POA: Diagnosis not present

## 2022-04-02 DIAGNOSIS — Z6827 Body mass index (BMI) 27.0-27.9, adult: Secondary | ICD-10-CM | POA: Insufficient documentation

## 2022-04-02 DIAGNOSIS — F419 Anxiety disorder, unspecified: Secondary | ICD-10-CM

## 2022-04-02 DIAGNOSIS — Z6833 Body mass index (BMI) 33.0-33.9, adult: Secondary | ICD-10-CM

## 2022-04-02 MED ORDER — ESCITALOPRAM OXALATE 10 MG PO TABS: 10.0000 mg | ORAL_TABLET | Freq: Every day | ORAL | 1 refills | Status: DC

## 2022-04-02 MED ORDER — ALBUTEROL SULFATE HFA 108 (90 BASE) MCG/ACT IN AERS: INHALATION_SPRAY | RESPIRATORY_TRACT | 0 refills | Status: DC

## 2022-04-02 NOTE — Assessment & Plan Note (Signed)
Encourage diet and exercise for weight loss 

## 2022-04-02 NOTE — Assessment & Plan Note (Signed)
We will see if this improves with treatment of anxiety and depression Consider daily preventative medication if symptoms persist Continue ibuprofen as needed but avoid medication overuse

## 2022-04-02 NOTE — Assessment & Plan Note (Signed)
We will check c-Met and lipid profile with annual exam °Encouraged her to consume a low-fat diet °

## 2022-04-02 NOTE — Patient Instructions (Signed)
Heart-Healthy Eating Plan Heart-healthy meal planning includes: Eating less unhealthy fats. Eating more healthy fats. Making other changes in your diet. Talk with your doctor or a diet specialist (dietitian) to create an eating plan that is right for you. What is my plan? Your doctor may recommend an eating plan that includes: Total fat: ______% or less of total calories a day. Saturated fat: ______% or less of total calories a day. Cholesterol: less than _________mg a day. What are tips for following this plan? Cooking Avoid frying your food. Try to bake, boil, grill, or broil it instead. You can also reduce fat by: Removing the skin from poultry. Removing all visible fats from meats. Steaming vegetables in water or broth. Meal planning  At meals, divide your plate into four equal parts: Fill one-half of your plate with vegetables and green salads. Fill one-fourth of your plate with whole grains. Fill one-fourth of your plate with lean protein foods. Eat 4-5 servings of vegetables per day. A serving of vegetables is: 1 cup of raw or cooked vegetables. 2 cups of raw leafy greens. Eat 4-5 servings of fruit per day. A serving of fruit is: 1 medium whole fruit.  cup of dried fruit.  cup of fresh, frozen, or canned fruit.  cup of 100% fruit juice. Eat more foods that have soluble fiber. These are apples, broccoli, carrots, beans, peas, and barley. Try to get 20-30 g of fiber per day. Eat 4-5 servings of nuts, legumes, and seeds per week: 1 serving of dried beans or legumes equals  cup after being cooked. 1 serving of nuts is  cup. 1 serving of seeds equals 1 tablespoon. General information Eat more home-cooked food. Eat less restaurant, buffet, and fast food. Limit or avoid alcohol. Limit foods that are high in starch and sugar. Avoid fried foods. Lose weight if you are overweight. Keep track of how much salt (sodium) you eat. This is important if you have high blood  pressure. Ask your doctor to tell you more about this. Try to add vegetarian meals each week. Fats Choose healthy fats. These include olive oil and canola oil, flaxseeds, walnuts, almonds, and seeds. Eat more omega-3 fats. These include salmon, mackerel, sardines, tuna, flaxseed oil, and ground flaxseeds. Try to eat fish at least 2 times each week. Check food labels. Avoid foods with trans fats or high amounts of saturated fat. Limit saturated fats. These are often found in animal products, such as meats, butter, and cream. These are also found in plant foods, such as palm oil, palm kernel oil, and coconut oil. Avoid foods with partially hydrogenated oils in them. These have trans fats. Examples are stick margarine, some tub margarines, cookies, crackers, and other baked goods. What foods can I eat? Fruits All fresh, canned (in natural juice), or frozen fruits. Vegetables Fresh or frozen vegetables (raw, steamed, roasted, or grilled). Green salads. Grains Most grains. Choose whole wheat and whole grains most of the time. Rice and pasta, including brown rice and pastas made with whole wheat. Meats and other proteins Lean, well-trimmed beef, veal, pork, and lamb. Chicken and turkey without skin. All fish and shellfish. Wild duck, rabbit, pheasant, and venison. Egg whites or low-cholesterol egg substitutes. Dried beans, peas, lentils, and tofu. Seeds and most nuts. Dairy Low-fat or nonfat cheeses, including ricotta and mozzarella. Skim or 1% milk that is liquid, powdered, or evaporated. Buttermilk that is made with low-fat milk. Nonfat or low-fat yogurt. Fats and oils Non-hydrogenated (trans-free) margarines. Vegetable oils, including   soybean, sesame, sunflower, olive, peanut, safflower, corn, canola, and cottonseed. Salad dressings or mayonnaise made with a vegetable oil. Beverages Mineral water. Coffee and tea. Diet carbonated beverages. Sweets and desserts Sherbet, gelatin, and fruit ice.  Small amounts of dark chocolate. Limit all sweets and desserts. Seasonings and condiments All seasonings and condiments. The items listed above may not be a complete list of foods and drinks you can eat. Contact a dietitian for more options. What foods should I avoid? Fruits Canned fruit in heavy syrup. Fruit in cream or butter sauce. Fried fruit. Limit coconut. Vegetables Vegetables cooked in cheese, cream, or butter sauce. Fried vegetables. Grains Breads that are made with saturated or trans fats, oils, or whole milk. Croissants. Sweet rolls. Donuts. High-fat crackers, such as cheese crackers. Meats and other proteins Fatty meats, such as hot dogs, ribs, sausage, bacon, rib-eye roast or steak. High-fat deli meats, such as salami and bologna. Caviar. Domestic duck and goose. Organ meats, such as liver. Dairy Cream, sour cream, cream cheese, and creamed cottage cheese. Whole-milk cheeses. Whole or 2% milk that is liquid, evaporated, or condensed. Whole buttermilk. Cream sauce or high-fat cheese sauce. Yogurt that is made from whole milk. Fats and oils Meat fat, or shortening. Cocoa butter, hydrogenated oils, palm oil, coconut oil, palm kernel oil. Solid fats and shortenings, including bacon fat, salt pork, lard, and butter. Nondairy cream substitutes. Salad dressings with cheese or sour cream. Beverages Regular sodas and juice drinks with added sugar. Sweets and desserts Frosting. Pudding. Cookies. Cakes. Pies. Milk chocolate or white chocolate. Buttered syrups. Full-fat ice cream or ice cream drinks. The items listed above may not be a complete list of foods and drinks to avoid. Contact a dietitian for more information. Summary Heart-healthy meal planning includes eating less unhealthy fats, eating more healthy fats, and making other changes in your diet. Eat a balanced diet. This includes fruits and vegetables, low-fat or nonfat dairy, lean protein, nuts and legumes, whole grains, and  heart-healthy oils and fats. This information is not intended to replace advice given to you by your health care provider. Make sure you discuss any questions you have with your health care provider. Document Revised: 01/24/2021 Document Reviewed: 01/24/2021 Elsevier Patient Education  2022 Elsevier Inc.  

## 2022-04-02 NOTE — Assessment & Plan Note (Signed)
Persistent We will trial escitalopram 10 mg daily She will continue meet with a therapist Support offered

## 2022-04-02 NOTE — Assessment & Plan Note (Signed)
Encourage weight loss as this can help reduce reflux symptoms Continue omeprazole as needed 

## 2022-04-02 NOTE — Progress Notes (Signed)
Subjective:    Patient ID: Sonya Spencer, female    DOB: 1986-02-22, 35 y.o.   MRN: [id number]  HPI  Patient presents to clinic today for follow-up of chronic conditions.    Anxiety and Depression.  This is a chronic issue for her. She is prescribed Citalopram and Bupropion but is not currently taking them.  She is currently seeing a therapist.  She denies SI/HI.  Frequent Headaches: These occur daily.  Triggered by stress.  She takes Ibuprofen as needed with good relief of symptoms.  She does not follow with neurology.  Anemia: Her last H/H was 10.5/35.3, 11/2021.  She is not currently taking any oral iron at this time.  She does not follow with hematology.  HLD: Her last LDL was 178, triglycerides 202, 2020.  She is not taking any oral cholesterol-lowering medication at this time.  She does not consume a low-fat diet.  GERD: She is not sure what triggers this, may be stress.  She takes Omeprazole as needed with good relief of symptoms.  There is no upper GI on file.  Review of Systems     Past Medical History:  Diagnosis Date   Abnormal glucose tolerance test (GTT) during pregnancy, antepartum 12/23/2017   - pt check BG log.   - Lifestyles referral for presumptive GDM (also due to inordinate weight loss early in pregnancy) - will treat as GDM.  Patient has refused 3 hour gtt.     Allergy    Anemia    with pregnancy only   Anxiety    Asthma    only with pregnancy   Cholelithiasis    Complication of anesthesia    panic attack with first panic   Depression    Fetal macrosomia during pregnancy in third trimester 03/25/2015   Resolved with delivery    Frequent headaches    migraines with pregnancy   GERD (gastroesophageal reflux disease)    Gestational diabetes    Gestational diabetes mellitus (GDM) affecting third pregnancy 02/05/2018   H/O maternal third degree perineal laceration, currently pregnant    History of Papanicolaou smear of cervix 10/10/11; 08/02/14   neg, ct  neg; neg ct//gc/tr neg   PONV (postoperative nausea and vomiting)    Supervision of normal pregnancy 11/09/2017   [facility] Prenatal Labs Dating  Blood type:    Genetic Screen 1 Screen:    AFP:     Quad:     NIPS: Antibody:  Anatomic [place]  Rubella:   Varicella:   GTT Early:               Third trimester:  RPR:    Rhogam  HBsAg:    TDaP vaccine                       Flu Shot: HIV:    Baby Food                                GBS:  Contraception  Pap: CBB    CS/VBAC   Support Person         Urinary incontinence     Current Outpatient Medications  Medication Sig Dispense Refill   albuterol (VENTOLIN HFA) 108 (90 Base) MCG/ACT inhaler TAKE 2 PUFFS BY MOUTH EVERY 6 HOURS AS NEEDED FOR WHEEZE     buPROPion (WELLBUTRIN XL) 300 MG 24 hr tablet Take 1 tablet (300 mg  total) by mouth at bedtime. 90 tablet 1   cetirizine (ZYRTEC) 10 MG tablet Take 1 tablet (10 mg total) by mouth every evening. 90 tablet 3   citalopram (CELEXA) 10 MG tablet Take 1 tablet (10 mg total) by mouth daily. 30 tablet 3   omeprazole (PRILOSEC) 20 MG capsule TAKE 1 CAPSULE BY MOUTH ONCE DAILY (Patient taking differently: Take 20 mg by mouth as needed.) 30 capsule 2   ondansetron (ZOFRAN-ODT) 4 MG disintegrating tablet Take 1 tablet (4 mg total) by mouth every 8 (eight) hours as needed for nausea or vomiting. 20 tablet 0   No current facility-administered medications for this visit.    Allergies  Allergen Reactions   Nickel Rash   Red Dye Rash    Includes Benadryl as it has red dye in it.    Family History  Problem Relation Age of Onset   Lupus Mother    Hepatitis C Mother    Depression Mother    Diabetes type I Father    Diabetes Father        type 1   Thyroid disease Father    Lupus Sister    Lupus Maternal Grandmother    Diabetes Maternal Grandmother    Diabetes type I Daughter    Diabetes Daughter    Lupus Maternal Aunt    Diabetes Paternal Grandfather        type 1   Diabetes Paternal Aunt        type 1  - runs in father's side    Social History   Socioeconomic History   Marital status: Married    Spouse name: Not on file   Number of children: 3   Years of education: 14   Highest education level: Not on file  Occupational History   Occupation: HOMEMAKER  Tobacco Use   Smoking status: Former    Packs/day: 0.25    Years: 2.00    Total pack years: 0.50    Types: Cigarettes    Quit date: 03/13/2009    Years since quitting: 13.0   Smokeless tobacco: Never  Vaping Use   Vaping Use: Never used  Substance and Sexual Activity   Alcohol use: Not Currently    Alcohol/week: 3.0 standard drinks of alcohol    Types: 3 Shots of liquor per week    Comment: x 3 daily    Drug use: No   Sexual activity: Yes    Birth control/protection: Surgical    Comment: Tubal Ligation  Other Topics Concern   Not on file  Social History Narrative   Not on file   Social Determinants of Health   Financial Resource Strain: Not on file  Food Insecurity: Not on file  Transportation Needs: Not on file  Physical Activity: Not on file  Stress: Not on file  Social Connections: Not on file  Intimate Partner Violence: Not on file     Constitutional: Patient reports intermittent headaches.  Denies fever, malaise, fatigue, or abrupt weight changes.  HEENT: Denies eye pain, eye redness, ear pain, ringing in the ears, wax buildup, runny nose, nasal congestion, bloody nose, or sore throat. Respiratory: Denies difficulty breathing, shortness of breath, cough or sputum production.   Cardiovascular: Denies chest pain, chest tightness, palpitations or swelling in the hands or feet.  Gastrointestinal: Patient reports intermittent reflux.  Denies abdominal pain, bloating, constipation, diarrhea or blood in the stool.  GU: Denies urgency, frequency, pain with urination, burning sensation, blood in urine, odor or discharge. Musculoskeletal:  Denies decrease in range of motion, difficulty with gait, muscle pain or joint  pain and swelling.  Skin: Denies redness, rashes, lesions or ulcercations.  Neurological: Denies dizziness, difficulty with memory, difficulty with speech or problems with balance and coordination.  Psych: Patient has a history of anxiety and depression.  Denies SI/HI.  No other specific complaints in a complete review of systems (except as listed in HPI above).  Objective:   Physical Exam   BP 118/82 (BP Location: Left Arm, Patient Position: Sitting, Cuff Size: Large)   Pulse 80   Temp (!) 97.1 F (36.2 C) (Temporal)   Ht 5' 2.5" (1.588 m)   Wt 187 lb (84.8 kg)   SpO2 100%   BMI 33.66 kg/m   Wt Readings from Last 3 Encounters:  12/04/21 218 lb 4.1 oz (99 kg)  11/12/21 219 lb (99.3 kg)  01/01/21 195 lb 9.6 oz (88.7 kg)    General: Appears her stated age, obese, in NAD. Skin: Warm, dry and intact.  HEENT: Head: normal shape and size; Eyes: sclera white, no icterus, conjunctiva pink, PERRLA and EOMs intact;  Cardiovascular: Normal rate and rhythm. S1,S2 noted.  No murmur, rubs or gallops noted.  Pulmonary/Chest: Normal effort and positive vesicular breath sounds. No respiratory distress. No wheezes, rales or ronchi noted.  Abdomen: Normal bowel sounds.  Musculoskeletal:  No difficulty with gait.  Neurological: Alert and oriented.  Psychiatric: Mood and affect mildly flat. Behavior is normal. Judgment and thought content normal.     BMET    Component Value Date/Time   NA 139 12/04/2021 2215   NA 138 10/08/2014 1732   K 3.6 12/04/2021 2215   K 3.6 10/08/2014 1732   CL 108 12/04/2021 2215   CL 106 10/08/2014 1732   CO2 23 12/04/2021 2215   CO2 23 10/08/2014 1732   GLUCOSE 91 12/04/2021 2215   GLUCOSE 70 10/08/2014 1732   BUN 18 12/04/2021 2215   BUN 5 (L) 10/08/2014 1732   CREATININE 0.67 12/04/2021 2215   CREATININE 0.70 10/03/2019 1110   CALCIUM 8.9 12/04/2021 2215   CALCIUM 8.6 10/08/2014 1732   GFRNONAA >60 12/04/2021 2215   GFRNONAA 115 10/03/2019 1110    GFRAA >60 05/09/2020 0128   GFRAA 134 10/03/2019 1110    Lipid Panel     Component Value Date/Time   CHOL 267 (H) 03/08/2019 0923   TRIG 202 (H) 03/08/2019 0923   HDL 53 03/08/2019 0923   CHOLHDL 5.0 (H) 03/08/2019 0923   LDLCALC 178 (H) 03/08/2019 0923    CBC    Component Value Date/Time   WBC 11.8 (H) 12/04/2021 2215   RBC 4.38 12/04/2021 2215   HGB 10.9 (L) 12/04/2021 2215   HGB 11.2 04/22/2018 1054   HCT 35.3 (L) 12/04/2021 2215   HCT 33.3 (L) 04/22/2018 1054   PLT 362 12/04/2021 2215   PLT 250 04/22/2018 1054   MCV 80.6 12/04/2021 2215   MCV 88 04/22/2018 1054   MCV 88 10/08/2014 1732   MCH 24.9 (L) 12/04/2021 2215   MCHC 30.9 12/04/2021 2215   RDW 14.3 12/04/2021 2215   RDW 14.3 04/22/2018 1054   RDW 14.0 10/08/2014 1732   LYMPHSABS 3.1 11/12/2021 0846   LYMPHSABS 3.3 10/08/2014 1732   MONOABS 0.5 11/12/2021 0846   MONOABS 0.7 10/08/2014 1732   EOSABS 0.1 11/12/2021 0846   EOSABS 0.1 10/08/2014 1732   BASOSABS 0.1 11/12/2021 0846   BASOSABS 0.0 10/08/2014 1732    Hgb A1C Lab  Results  Component Value Date   HGBA1C 5.3 10/03/2019           Assessment & Plan:     Webb Silversmith, NP

## 2022-09-10 ENCOUNTER — Encounter: Payer: Self-pay | Admitting: Emergency Medicine

## 2022-09-10 ENCOUNTER — Other Ambulatory Visit: Payer: Self-pay

## 2022-09-10 ENCOUNTER — Emergency Department
Admission: EM | Admit: 2022-09-10 | Discharge: 2022-09-10 | Disposition: A | Discharge status: Home / Self Care | Payer: Commercial Managed Care - PPO | Attending: Emergency Medicine | Admitting: Emergency Medicine

## 2022-09-10 ENCOUNTER — Emergency Department: Payer: Commercial Managed Care - PPO

## 2022-09-10 DIAGNOSIS — M542 Cervicalgia: Secondary | ICD-10-CM | POA: Insufficient documentation

## 2022-09-10 DIAGNOSIS — M545 Low back pain, unspecified: Secondary | ICD-10-CM | POA: Insufficient documentation

## 2022-09-10 DIAGNOSIS — M25512 Pain in left shoulder: Secondary | ICD-10-CM | POA: Insufficient documentation

## 2022-09-10 DIAGNOSIS — Y92410 Unspecified street and highway as the place of occurrence of the external cause: Secondary | ICD-10-CM | POA: Diagnosis not present

## 2022-09-10 DIAGNOSIS — M25552 Pain in left hip: Secondary | ICD-10-CM | POA: Insufficient documentation

## 2022-09-10 MED ORDER — METHOCARBAMOL 500 MG PO TABS: 500.0000 mg | ORAL_TABLET | Freq: Three times a day (TID) | ORAL | 0 refills | Status: AC | PRN

## 2022-09-10 MED ORDER — MELOXICAM 15 MG PO TABS: 15.0000 mg | ORAL_TABLET | Freq: Every day | ORAL | 1 refills | Status: AC

## 2022-09-10 MED ORDER — KETOROLAC TROMETHAMINE 30 MG/ML IJ SOLN
30.0000 mg | Freq: Once | INTRAMUSCULAR | Status: AC
  Administered 2022-09-10: 30 mg via INTRAMUSCULAR
  Filled 2022-09-10: qty 1
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
## 2022-09-10 NOTE — ED Notes (Signed)
Pt signed esignature  d/c inst to pt.   

## 2022-09-10 NOTE — Discharge Instructions (Signed)
Take Meloxicam and Robaxin for pain and muscle spasms.
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
## 2022-10-06 ENCOUNTER — Encounter: Payer: Commercial Managed Care - PPO | Admitting: Internal Medicine

## 2022-12-18 ENCOUNTER — Other Ambulatory Visit: Payer: Self-pay | Admitting: Internal Medicine

## 2022-12-18 NOTE — Telephone Encounter (Signed)
Call to patient- scheduled medication follow up appointment. Courtesy 30 day given Requested Prescriptions  Pending Prescriptions Disp Refills   escitalopram (LEXAPRO) 10 MG tablet [Pharmacy Med Name: ESCITALOPRAM OXALATE 10MG  TABLET] 90 tablet 1    Sig: TAKE ONE TABLET BY MOUTH ONCE DAILY     Psychiatry:  Antidepressants - SSRI Failed - 12/18/2022  1:19 PM      Failed - Completed PHQ-2 or PHQ-9 in the last 360 days      Failed - Valid encounter within last 6 months    Recent Outpatient Visits           8 months ago Anxiety and depression   [place], Coralie Keens, NP   1 year ago Anxiety and depression   [facility] Kathrine Haddock, NP   2 years ago Frequent headaches   [facility] [place], Lupita Raider, FNP   3 years ago Other migraine with status migrainosus, intractable   [place], DO   3 years ago Pelvic cramping   [place], [place], DO       Future Appointments             In 6 days Baity, Coralie Keens, NP [facility]             albuterol (VENTOLIN HFA) 108 (90 Base) MCG/ACT inhaler [Pharmacy Med Name: ALBUTEROL SULFATE HFA HFA AEROSOL SOLN] 8.5 g 0    Sig: TAKE TWO PUFFS BY MOUTH EVERY SIX HOURS AS NEEDED FOR WHEEZE     Pulmonology:  Beta Agonists 2 Failed - 12/18/2022  1:19 PM      Failed - Last BP in normal range    BP Readings from Last 1 Encounters:  09/10/22 (!) 142/92         Passed - Last Heart Rate in normal range    Pulse Readings from Last 1 Encounters:  09/10/22 67         Passed - Valid encounter within last 12 months    Recent Outpatient Visits           8 months ago Anxiety and depression   [facility] [place], Coralie Keens, NP   1 year ago Anxiety and depression   [facility]  Kathrine Haddock, NP   2 years ago Frequent headaches   [place], FNP   3 years ago Other migraine with status migrainosus, intractable   [place], DO   3 years ago Pelvic cramping   [place], [place], DO       Future Appointments             In 6 days Baity, Coralie Keens, NP [facility], [facility]

## 2022-12-22 ENCOUNTER — Encounter: Payer: Self-pay | Admitting: Internal Medicine

## 2022-12-24 ENCOUNTER — Telehealth: Payer: Commercial Managed Care - PPO | Admitting: Internal Medicine

## 2023-01-28 ENCOUNTER — Encounter: Payer: Self-pay | Admitting: Internal Medicine

## 2023-01-28 ENCOUNTER — Telehealth (INDEPENDENT_AMBULATORY_CARE_PROVIDER_SITE_OTHER): Payer: Commercial Managed Care - PPO | Admitting: Internal Medicine

## 2023-01-28 DIAGNOSIS — F32A Depression, unspecified: Secondary | ICD-10-CM

## 2023-01-28 DIAGNOSIS — R519 Headache, unspecified: Secondary | ICD-10-CM

## 2023-01-28 DIAGNOSIS — E782 Mixed hyperlipidemia: Secondary | ICD-10-CM | POA: Diagnosis not present

## 2023-01-28 DIAGNOSIS — F419 Anxiety disorder, unspecified: Secondary | ICD-10-CM

## 2023-01-28 DIAGNOSIS — K219 Gastro-esophageal reflux disease without esophagitis: Secondary | ICD-10-CM | POA: Diagnosis not present

## 2023-01-28 MED ORDER — BUPROPION HCL ER (XL) 150 MG PO TB24: 150.0000 mg | ORAL_TABLET | Freq: Every day | ORAL | 1 refills | Status: DC

## 2023-01-28 MED ORDER — ESCITALOPRAM OXALATE 20 MG PO TABS: 20.0000 mg | ORAL_TABLET | Freq: Every day | ORAL | 1 refills | Status: DC

## 2023-01-28 NOTE — Assessment & Plan Note (Signed)
Continue ibuprofen and Tylenol OTC as needed

## 2023-01-28 NOTE — Assessment & Plan Note (Signed)
Try to identify and avoid foods that trigger reflux Encourage weight loss as this can help reduce reflux symptoms Okay to take Tums OTC as needed

## 2023-01-28 NOTE — Assessment & Plan Note (Signed)
Will check lipid panel at annual exam Encouraged to consume low-fat diet

## 2023-01-28 NOTE — Assessment & Plan Note (Signed)
Deteriorated Increase escitalopram to 20 mg daily We will add Wellbutrin 150 mg daily Support offered

## 2023-01-28 NOTE — Progress Notes (Signed)
Virtual Visit via Video Note  I connected with Gareth Eagle on 01/28/23 at 11:20 AM EDT by a video enabled telemedicine application and verified that I am speaking with the correct person using two identifiers.  Location: Patient: In her car Provider: Office  Persons participating in this video call: Nicki Reaper, NP and Leanor Rubenstein   I discussed the limitations of evaluation and management by telemedicine and the availability of in person appointments. The patient expressed understanding and agreed to proceed.  History of Present Illness:  Patient would like to follow-up chronic conditions.  GERD: Rarely occurs.  She takes Tums as needed with good relief of symptoms.  There is no upper GI on file.  Frequent Headaches: These occur rarely.  Triggered by stress, lack of caffeine or sleep.  She takes Ibuprofen and Tylenol as needed with some relief of symptoms.  She does not follow with neurology.  Anxiety and Depression: Chronic, managed on Escitalopram. She recently increased her dose to 15 mg but does not feel like it is effective. She has been on Wellbutrin in the past. She is not currently seeing a therapist.  She denies SI/HI.  HLD: Her last LDL was 178, triglycerides 202, 02/2019.  She is not taking any cholesterol-lowering medication at this time.  She does not consume low-fat diet.    Past Medical History:  Diagnosis Date   Abnormal glucose tolerance test (GTT) during pregnancy, antepartum 12/23/2017   - pt check BG log.   - Lifestyles referral for presumptive GDM (also due to inordinate weight loss early in pregnancy) - will treat as GDM.  Patient has refused 3 hour gtt.     Allergy    Anemia    with pregnancy only   Anxiety    Asthma    only with pregnancy   Cholelithiasis    Complication of anesthesia    panic attack with first panic   Depression    Fetal macrosomia during pregnancy in third trimester 03/25/2015   Resolved with delivery    Frequent headaches     migraines with pregnancy   GERD (gastroesophageal reflux disease)    Gestational diabetes    Gestational diabetes mellitus (GDM) affecting third pregnancy 02/05/2018   H/O maternal third degree perineal laceration, currently pregnant    History of Papanicolaou smear of cervix 10/10/11; 08/02/14   neg, ct neg; neg ct//gc/tr neg   PONV (postoperative nausea and vomiting)    Supervision of normal pregnancy 11/09/2017   [facility] Prenatal Labs Dating  Blood type:    Genetic Screen 1 Screen:    AFP:     Quad:     NIPS: Antibody:  Anatomic [place]  Rubella:   Varicella:   GTT Early:               Third trimester:  RPR:    Rhogam  HBsAg:    TDaP vaccine                       Flu Shot: HIV:    Baby Food                                GBS:  Contraception  Pap: CBB    CS/VBAC   Support Person         Urinary incontinence     Current Outpatient Medications  Medication Sig Dispense Refill   albuterol (VENTOLIN HFA)  108 (90 Base) MCG/ACT inhaler TAKE TWO PUFFS BY MOUTH EVERY SIX HOURS AS NEEDED FOR WHEEZE 8.5 g 0   cetirizine (ZYRTEC) 10 MG tablet Take 1 tablet (10 mg total) by mouth every evening. 90 tablet 3   escitalopram (LEXAPRO) 10 MG tablet TAKE ONE TABLET BY MOUTH ONCE DAILY 30 tablet 0   ondansetron (ZOFRAN-ODT) 4 MG disintegrating tablet Take 1 tablet (4 mg total) by mouth every 8 (eight) hours as needed for nausea or vomiting. 20 tablet 0   No current facility-administered medications for this visit.    Allergies  Allergen Reactions   Nickel Rash   Red Dye Rash    Includes Benadryl as it has red dye in it.    Family History  Problem Relation Age of Onset   Lupus Mother    Hepatitis C Mother    Depression Mother    Diabetes type I Father    Diabetes Father        type 1   Thyroid disease Father    Lupus Sister    Lupus Maternal Grandmother    Diabetes Maternal Grandmother    Diabetes type I Daughter    Diabetes Daughter    Lupus Maternal Aunt    Diabetes Paternal Grandfather         type 1   Diabetes Paternal Aunt        type 1 - runs in father's side    Social History   Socioeconomic History   Marital status: Married    Spouse name: Not on file   Number of children: 3   Years of education: 14   Highest education level: Not on file  Occupational History   Occupation: HOMEMAKER  Tobacco Use   Smoking status: Former    Packs/day: 0.25    Years: 2.00    Additional pack years: 0.00    Total pack years: 0.50    Types: Cigarettes    Quit date: 03/13/2009    Years since quitting: 13.8   Smokeless tobacco: Never  Vaping Use   Vaping Use: Never used  Substance and Sexual Activity   Alcohol use: Not Currently    Alcohol/week: 3.0 standard drinks of alcohol    Types: 3 Shots of liquor per week    Comment: x 3 daily    Drug use: No   Sexual activity: Yes    Birth control/protection: Surgical    Comment: Tubal Ligation  Other Topics Concern   Not on file  Social History Narrative   Not on file   Social Determinants of Health   Financial Resource Strain: Not on file  Food Insecurity: Not on file  Transportation Needs: Not on file  Physical Activity: Not on file  Stress: Not on file  Social Connections: Not on file  Intimate Partner Violence: Not on file     Constitutional: Patient reports intermittent headaches.  Denies fever, malaise, fatigue, or abrupt weight changes.  HEENT: Denies eye pain, eye redness, ear pain, ringing in the ears, wax buildup, runny nose, nasal congestion, bloody nose, or sore throat. Respiratory: Denies difficulty breathing, shortness of breath, cough or sputum production.   Cardiovascular: Denies chest pain, chest tightness, palpitations or swelling in the hands or feet.  Gastrointestinal: Patient reports intermittent reflux.  Denies abdominal pain, bloating, constipation, diarrhea or blood in the stool.  GU: Denies urgency, frequency, pain with urination, burning sensation, blood in urine, odor or  discharge. Musculoskeletal: Denies decrease in range of motion, difficulty with gait,  muscle pain or joint pain and swelling.  Skin: Denies redness, rashes, lesions or ulcercations.  Neurological: Denies dizziness, difficulty with memory, difficulty with speech or problems with balance and coordination.  Psych: Patient has a history of anxiety and depression.  Denies SI/HI.  No other specific complaints in a complete review of systems (except as listed in HPI above).  Observations/Objective:   Wt Readings from Last 3 Encounters:  09/10/22 187 lb (84.8 kg)  04/02/22 187 lb (84.8 kg)  12/04/21 218 lb 4.1 oz (99 kg)    General: Appears her stated age, well developed, well nourished in NAD. Pulmonary/Chest: Normal effort. No respiratory distress.  Neurological: Alert and oriented.  Psychiatric: Mood and affect normal. Behavior is normal. Judgment and thought content normal.   BMET    Component Value Date/Time   NA 139 12/04/2021 2215   NA 138 10/08/2014 1732   K 3.6 12/04/2021 2215   K 3.6 10/08/2014 1732   CL 108 12/04/2021 2215   CL 106 10/08/2014 1732   CO2 23 12/04/2021 2215   CO2 23 10/08/2014 1732   GLUCOSE 91 12/04/2021 2215   GLUCOSE 70 10/08/2014 1732   BUN 18 12/04/2021 2215   BUN 5 (L) 10/08/2014 1732   CREATININE 0.67 12/04/2021 2215   CREATININE 0.70 10/03/2019 1110   CALCIUM 8.9 12/04/2021 2215   CALCIUM 8.6 10/08/2014 1732   GFRNONAA >60 12/04/2021 2215   GFRNONAA 115 10/03/2019 1110   GFRAA >60 05/09/2020 0128   GFRAA 134 10/03/2019 1110    Lipid Panel     Component Value Date/Time   CHOL 267 (H) 03/08/2019 0923   TRIG 202 (H) 03/08/2019 0923   HDL 53 03/08/2019 0923   CHOLHDL 5.0 (H) 03/08/2019 0923   LDLCALC 178 (H) 03/08/2019 0923    CBC    Component Value Date/Time   WBC 11.8 (H) 12/04/2021 2215   RBC 4.38 12/04/2021 2215   HGB 10.9 (L) 12/04/2021 2215   HGB 11.2 04/22/2018 1054   HCT 35.3 (L) 12/04/2021 2215   HCT 33.3 (L) 04/22/2018  1054   PLT 362 12/04/2021 2215   PLT 250 04/22/2018 1054   MCV 80.6 12/04/2021 2215   MCV 88 04/22/2018 1054   MCV 88 10/08/2014 1732   MCH 24.9 (L) 12/04/2021 2215   MCHC 30.9 12/04/2021 2215   RDW 14.3 12/04/2021 2215   RDW 14.3 04/22/2018 1054   RDW 14.0 10/08/2014 1732   LYMPHSABS 3.1 11/12/2021 0846   LYMPHSABS 3.3 10/08/2014 1732   MONOABS 0.5 11/12/2021 0846   MONOABS 0.7 10/08/2014 1732   EOSABS 0.1 11/12/2021 0846   EOSABS 0.1 10/08/2014 1732   BASOSABS 0.1 11/12/2021 0846   BASOSABS 0.0 10/08/2014 1732    Hgb A1C Lab Results  Component Value Date   HGBA1C 5.3 10/03/2019       Assessment and Plan:  Schedule appointment for annual exam  Follow Up Instructions:    I discussed the assessment and treatment plan with the patient. The patient was provided an opportunity to ask questions and all were answered. The patient agreed with the plan and demonstrated an understanding of the instructions.   The patient was advised to call back or seek an in-person evaluation if the symptoms worsen or if the condition fails to improve as anticipated.   Nicki Reaper, NP

## 2023-01-28 NOTE — Patient Instructions (Signed)

## 2023-02-10 ENCOUNTER — Encounter: Payer: Self-pay | Admitting: Internal Medicine

## 2023-02-11 MED ORDER — BUPROPION HCL ER (XL) 150 MG PO TB24: 300.0000 mg | ORAL_TABLET | Freq: Every day | ORAL | 1 refills | Status: DC

## 2023-02-18 ENCOUNTER — Ambulatory Visit: Payer: Self-pay

## 2023-03-12 ENCOUNTER — Encounter: Payer: Self-pay | Admitting: Internal Medicine

## 2023-03-12 ENCOUNTER — Ambulatory Visit (INDEPENDENT_AMBULATORY_CARE_PROVIDER_SITE_OTHER): Payer: Commercial Managed Care - PPO | Admitting: Internal Medicine

## 2023-03-12 ENCOUNTER — Other Ambulatory Visit: Payer: Self-pay | Admitting: Internal Medicine

## 2023-03-12 VITALS — BP 112/70 | HR 68 | Temp 96.9°F | Ht 62.0 in | Wt 189.0 lb

## 2023-03-12 DIAGNOSIS — Z6834 Body mass index (BMI) 34.0-34.9, adult: Secondary | ICD-10-CM

## 2023-03-12 DIAGNOSIS — Z1159 Encounter for screening for other viral diseases: Secondary | ICD-10-CM

## 2023-03-12 DIAGNOSIS — Z0001 Encounter for general adult medical examination with abnormal findings: Secondary | ICD-10-CM | POA: Diagnosis not present

## 2023-03-12 DIAGNOSIS — D509 Iron deficiency anemia, unspecified: Secondary | ICD-10-CM | POA: Insufficient documentation

## 2023-03-12 DIAGNOSIS — G8929 Other chronic pain: Secondary | ICD-10-CM | POA: Insufficient documentation

## 2023-03-12 DIAGNOSIS — R7309 Other abnormal glucose: Secondary | ICD-10-CM

## 2023-03-12 DIAGNOSIS — E782 Mixed hyperlipidemia: Secondary | ICD-10-CM | POA: Diagnosis not present

## 2023-03-12 DIAGNOSIS — E6609 Other obesity due to excess calories: Secondary | ICD-10-CM

## 2023-03-12 MED ORDER — TIRZEPATIDE-WEIGHT MANAGEMENT 2.5 MG/0.5ML ~~LOC~~ SOAJ: 2.5000 mg | SUBCUTANEOUS | 0 refills | Status: DC

## 2023-03-12 NOTE — Assessment & Plan Note (Signed)
She will check insurance cover regarding Wegovy and Zepbound Encourage high-protein, low-carb diet and exercise for weight loss

## 2023-03-12 NOTE — Telephone Encounter (Signed)
Medication Refill - Medication:  Tirzepatide-Weight Management   Has the patient contacted their pharmacy? yes (Agent: If no, request that the patient contact the pharmacy for the refill. If patient does not wish to contact the pharmacy document the reason why and proceed with request.) (Agent: If yes, when and what did the pharmacy advise?)contact pcp  Preferred Pharmacy (with phone number or street name): [facility] [address] [place], [place] [address]  [phone]  Has the patient been seen for an appointment in the last year OR does the patient have an upcoming appointment? yes  Agent: Please be advised that RX refills may take up to 3 business days. We ask that you follow-up with your pharmacy.

## 2023-03-12 NOTE — Patient Instructions (Signed)

## 2023-03-12 NOTE — Progress Notes (Signed)
Subjective:    Patient ID: Gareth Eagle, female    DOB: May 11, 1986, 37 y.o.   MRN: [id number]  HPI  Patient presents to clinic today for annual exam.  Flu: 04/2018 Tetanus: 04/2018 COVID: Never Pap smear: 07/2018 Dentist: biannually  Diet: She does eat meat. She consumes some fruits and veggies. She tries to avoid fried foods. She drinks mostly water, soda. Exercise: Walking  Review of Systems     Past Medical History:  Diagnosis Date   Abnormal glucose tolerance test (GTT) during pregnancy, antepartum 12/23/2017   - pt check BG log.   - Lifestyles referral for presumptive GDM (also due to inordinate weight loss early in pregnancy) - will treat as GDM.  Patient has refused 3 hour gtt.     Allergy    Anemia    with pregnancy only   Anxiety    Asthma    only with pregnancy   Cholelithiasis    Complication of anesthesia    panic attack with first panic   Depression    Fetal macrosomia during pregnancy in third trimester 03/25/2015   Resolved with delivery    Frequent headaches    migraines with pregnancy   GERD (gastroesophageal reflux disease)    Gestational diabetes    Gestational diabetes mellitus (GDM) affecting third pregnancy 02/05/2018   H/O maternal third degree perineal laceration, currently pregnant    History of Papanicolaou smear of cervix 10/10/11; 08/02/14   neg, ct neg; neg ct//gc/tr neg   PONV (postoperative nausea and vomiting)    Supervision of normal pregnancy 11/09/2017   [facility] Prenatal Labs Dating  Blood type:    Genetic Screen 1 Screen:    AFP:     Quad:     NIPS: Antibody:  Anatomic [place]  Rubella:   Varicella:   GTT Early:               Third trimester:  RPR:    Rhogam  HBsAg:    TDaP vaccine                       Flu Shot: HIV:    Baby Food                                GBS:  Contraception  Pap: CBB    CS/VBAC   Support Person         Urinary incontinence     Current Outpatient Medications  Medication Sig Dispense Refill   albuterol  (VENTOLIN HFA) 108 (90 Base) MCG/ACT inhaler TAKE TWO PUFFS BY MOUTH EVERY SIX HOURS AS NEEDED FOR WHEEZE 8.5 g 0   buPROPion (WELLBUTRIN XL) 150 MG 24 hr tablet Take 2 tablets (300 mg total) by mouth daily. 180 tablet 1   cetirizine (ZYRTEC) 10 MG tablet Take 1 tablet (10 mg total) by mouth every evening. 90 tablet 3   escitalopram (LEXAPRO) 20 MG tablet Take 1 tablet (20 mg total) by mouth daily. 90 tablet 1   ondansetron (ZOFRAN-ODT) 4 MG disintegrating tablet Take 1 tablet (4 mg total) by mouth every 8 (eight) hours as needed for nausea or vomiting. 20 tablet 0   No current facility-administered medications for this visit.    Allergies  Allergen Reactions   Nickel Rash   Red Dye Rash    Includes Benadryl as it has red dye in it.    Family History  Problem Relation Age of Onset   Lupus Mother    Hepatitis C Mother    Depression Mother    Diabetes type I Father    Diabetes Father        type 1   Thyroid disease Father    Lupus Sister    Lupus Maternal Grandmother    Diabetes Maternal Grandmother    Diabetes type I Daughter    Diabetes Daughter    Lupus Maternal Aunt    Diabetes Paternal Grandfather        type 1   Diabetes Paternal Aunt        type 1 - runs in father's side    Social History   Socioeconomic History   Marital status: Legally Separated    Spouse name: Not on file   Number of children: 3   Years of education: 14   Highest education level: Not on file  Occupational History   Occupation: HOMEMAKER  Tobacco Use   Smoking status: Former    Packs/day: 0.25    Years: 2.00    Additional pack years: 0.00    Total pack years: 0.50    Types: Cigarettes    Quit date: 03/13/2009    Years since quitting: 14.0   Smokeless tobacco: Never  Vaping Use   Vaping Use: Never used  Substance and Sexual Activity   Alcohol use: Not Currently    Alcohol/week: 3.0 standard drinks of alcohol    Types: 3 Shots of liquor per week    Comment: x 3 daily    Drug use: No    Sexual activity: Yes    Birth control/protection: Surgical    Comment: Tubal Ligation  Other Topics Concern   Not on file  Social History Narrative   Not on file   Social Determinants of Health   Financial Resource Strain: Not on file  Food Insecurity: Not on file  Transportation Needs: Not on file  Physical Activity: Not on file  Stress: Not on file  Social Connections: Not on file  Intimate Partner Violence: Not on file     Constitutional: Patient reports intermittent headaches.  Denies fever, malaise, fatigue, or abrupt weight changes.  HEENT: Denies eye pain, eye redness, ear pain, ringing in the ears, wax buildup, runny nose, nasal congestion, bloody nose, or sore throat. Respiratory: Denies difficulty breathing, shortness of breath, cough or sputum production.   Cardiovascular: Denies chest pain, chest tightness, palpitations or swelling in the hands or feet.  Gastrointestinal: Denies abdominal pain, bloating, constipation, diarrhea or blood in the stool.  GU: Denies urgency, frequency, pain with urination, burning sensation, blood in urine, odor or discharge. Musculoskeletal: Pt reports bilateral shoulder pain. Denies decrease in range of motion, difficulty with gait, muscle pain or joint swelling.  Skin: Denies redness, rashes, lesions or ulcercations.  Neurological: Denies dizziness, difficulty with memory, difficulty with speech or problems with balance and coordination.  Psych: Patient has a history of anxiety and depression.  Denies SI/HI.  No other specific complaints in a complete review of systems (except as listed in HPI above).  Objective:   Physical Exam   BP 112/70 (BP Location: Right Arm, Patient Position: Sitting, Cuff Size: Normal)   Pulse 68   Temp (!) 96.9 F (36.1 C) (Temporal)   Ht 5\' 2"  (1.575 m)   Wt 189 lb (85.7 kg)   SpO2 97%   BMI 34.57 kg/m   Wt Readings from Last 3 Encounters:  09/10/22 187 lb (84.8 kg)  04/02/22 187 lb (84.8 kg)   12/04/21 218 lb 4.1 oz (99 kg)    General: Appears her stated age, obese, in NAD. Skin: Warm, dry and intact.  Multiple horizontal scars noted to the left upper arm and left wrist. HEENT: Head: normal shape and size; Eyes: sclera white, no icterus, conjunctiva pink, PERRLA and EOMs intact;  Neck:  Neck supple, trachea midline. No masses, lumps or thyromegaly present.  Cardiovascular: Normal rate and rhythm. S1,S2 noted.  No murmur, rubs or gallops noted. No JVD or BLE edema.  Pulmonary/Chest: Normal effort and positive vesicular breath sounds. No respiratory distress. No wheezes, rales or ronchi noted.  Abdomen: Normal bowel sounds.  Musculoskeletal: Strength 5/5 BUE/BLE. No difficulty with gait.  Neurological: Alert and oriented. Cranial nerves II-XII grossly intact. Coordination normal.  Psychiatric: Mood and affect normal. Behavior is normal. Judgment and thought content normal.     BMET    Component Value Date/Time   NA 139 12/04/2021 2215   NA 138 10/08/2014 1732   K 3.6 12/04/2021 2215   K 3.6 10/08/2014 1732   CL 108 12/04/2021 2215   CL 106 10/08/2014 1732   CO2 23 12/04/2021 2215   CO2 23 10/08/2014 1732   GLUCOSE 91 12/04/2021 2215   GLUCOSE 70 10/08/2014 1732   BUN 18 12/04/2021 2215   BUN 5 (L) 10/08/2014 1732   CREATININE 0.67 12/04/2021 2215   CREATININE 0.70 10/03/2019 1110   CALCIUM 8.9 12/04/2021 2215   CALCIUM 8.6 10/08/2014 1732   GFRNONAA >60 12/04/2021 2215   GFRNONAA 115 10/03/2019 1110   GFRAA >60 05/09/2020 0128   GFRAA 134 10/03/2019 1110    Lipid Panel     Component Value Date/Time   CHOL 267 (H) 03/08/2019 0923   TRIG 202 (H) 03/08/2019 0923   HDL 53 03/08/2019 0923   CHOLHDL 5.0 (H) 03/08/2019 0923   LDLCALC 178 (H) 03/08/2019 0923    CBC    Component Value Date/Time   WBC 11.8 (H) 12/04/2021 2215   RBC 4.38 12/04/2021 2215   HGB 10.9 (L) 12/04/2021 2215   HGB 11.2 04/22/2018 1054   HCT 35.3 (L) 12/04/2021 2215   HCT 33.3 (L)  04/22/2018 1054   PLT 362 12/04/2021 2215   PLT 250 04/22/2018 1054   MCV 80.6 12/04/2021 2215   MCV 88 04/22/2018 1054   MCV 88 10/08/2014 1732   MCH 24.9 (L) 12/04/2021 2215   MCHC 30.9 12/04/2021 2215   RDW 14.3 12/04/2021 2215   RDW 14.3 04/22/2018 1054   RDW 14.0 10/08/2014 1732   LYMPHSABS 3.1 11/12/2021 0846   LYMPHSABS 3.3 10/08/2014 1732   MONOABS 0.5 11/12/2021 0846   MONOABS 0.7 10/08/2014 1732   EOSABS 0.1 11/12/2021 0846   EOSABS 0.1 10/08/2014 1732   BASOSABS 0.1 11/12/2021 0846   BASOSABS 0.0 10/08/2014 1732    Hgb A1C Lab Results  Component Value Date   HGBA1C 5.3 10/03/2019           Assessment & Plan:   Preventative Health Maintenance:  Encouraged her to get a flu shot in the fall Tetanus UTD Encouraged her to get her COVID vaccine Pap smear declined today Encouraged her to consume a balanced diet and exercise regimen Advised to see an eye doctor and dentist annually We will check CBC, c-Met, lipid, A1c and hep C today  RTC in 6 months, follow-up chronic conditions Nicki Reaper, NP

## 2023-03-13 LAB — COMPLETE METABOLIC PANEL WITH GFR
AG Ratio: 1.6 (calc) (ref 1.0–2.5)
ALT: 12 U/L (ref 6–29)
AST: 34 U/L — ABNORMAL HIGH (ref 10–30)
Albumin: 4.2 g/dL (ref 3.6–5.1)
Alkaline phosphatase (APISO): 58 U/L (ref 31–125)
BUN: 18 mg/dL (ref 7–25)
CO2: 25 mmol/L (ref 20–32)
Calcium: 9.5 mg/dL (ref 8.6–10.2)
Chloride: 104 mmol/L (ref 98–110)
Creat: 0.69 mg/dL (ref 0.50–0.97)
Globulin: 2.6 g/dL (calc) (ref 1.9–3.7)
Glucose, Bld: 79 mg/dL (ref 65–99)
Potassium: 4.3 mmol/L (ref 3.5–5.3)
Sodium: 137 mmol/L (ref 135–146)
Total Bilirubin: 0.3 mg/dL (ref 0.2–1.2)
Total Protein: 6.8 g/dL (ref 6.1–8.1)
eGFR: 117 mL/min/{1.73_m2} (ref >=60)

## 2023-03-13 LAB — LIPID PANEL
Cholesterol: 236 mg/dL — ABNORMAL HIGH (ref <200)
HDL: 62 mg/dL (ref >=50)
LDL Cholesterol (Calc): 155 mg/dL (calc) — ABNORMAL HIGH
Non-HDL Cholesterol (Calc): 174 mg/dL (calc) — ABNORMAL HIGH (ref <130)
Total CHOL/HDL Ratio: 3.8 (calc) (ref <5.0)
Triglycerides: 85 mg/dL (ref <150)

## 2023-03-13 LAB — CBC
HCT: 33.3 % — ABNORMAL LOW (ref 35.0–45.0)
Hemoglobin: 10.4 g/dL — ABNORMAL LOW (ref 11.7–15.5)
MCH: 24.6 pg — ABNORMAL LOW (ref 27.0–33.0)
MCHC: 31.2 g/dL — ABNORMAL LOW (ref 32.0–36.0)
MCV: 78.9 fL — ABNORMAL LOW (ref 80.0–100.0)
MPV: 9.6 fL (ref 7.5–12.5)
Platelets: 369 10*3/uL (ref 140–400)
RBC: 4.22 10*6/uL (ref 3.80–5.10)
RDW: 14 % (ref 11.0–15.0)
WBC: 6.9 10*3/uL (ref 3.8–10.8)

## 2023-03-13 LAB — HEMOGLOBIN A1C
Hgb A1c MFr Bld: 5.5 % of total Hgb (ref <5.7)
Mean Plasma Glucose: 111 mg/dL
eAG (mmol/L): 6.2 mmol/L

## 2023-03-13 LAB — HEPATITIS C ANTIBODY: Hepatitis C Ab: NONREACTIVE

## 2023-03-13 NOTE — Telephone Encounter (Signed)
Requested medication (s) are due for refill today - no  Requested medication (s) are on the active medication list -yes  Future visit scheduled -yes  Last refill: 03/12/23  Notes to clinic: duplicate request- filled 03/13/23, off protocol medication   Requested Prescriptions  Pending Prescriptions Disp Refills   tirzepatide (ZEPBOUND) 2.5 MG/0.5ML Pen 2 mL 0    Sig: Inject 2.5 mg into the skin once a week.     Off-Protocol Failed - 03/12/2023  5:48 PM      Failed - Medication not assigned to a protocol, review manually.      Passed - Valid encounter within last 12 months    Recent Outpatient Visits           Yesterday Encounter for general adult medical examination with abnormal findings   [facility] [place], [place] W, NP   1 month ago Gastroesophageal reflux disease without esophagitis   [place] [facility] [place], Salvadore Oxford, NP   11 months ago Anxiety and depression   [place] [facility] [place], Salvadore Oxford, NP   2 years ago Anxiety and depression   [place] [facility] Gabriel Cirri, NP   2 years ago Frequent headaches   [facility] [place], Jodelle Gross, FNP       Future Appointments             In 6 months Baity, Salvadore Oxford, NP [place] [facility], [facility]               Requested Prescriptions  Pending Prescriptions Disp Refills   tirzepatide (ZEPBOUND) 2.5 MG/0.5ML Pen 2 mL 0    Sig: Inject 2.5 mg into the skin once a week.     Off-Protocol Failed - 03/12/2023  5:48 PM      Failed - Medication not assigned to a protocol, review manually.      Passed - Valid encounter within last 12 months    Recent Outpatient Visits           Yesterday Encounter for general adult medical examination with abnormal findings   [place] [facility] [place], [place], NP   1 month ago Gastroesophageal reflux disease without esophagitis    [place] [facility] [place], Salvadore Oxford, NP   11 months ago Anxiety and depression   [place] [facility] [place], Salvadore Oxford, NP   2 years ago Anxiety and depression   [place] [facility] Gabriel Cirri, NP   2 years ago Frequent headaches   [place] [facility], Jodelle Gross, FNP       Future Appointments             In 6 months Baity, Salvadore Oxford, NP [facility], [facility]

## 2023-03-16 ENCOUNTER — Other Ambulatory Visit: Payer: Self-pay | Admitting: Internal Medicine

## 2023-03-16 NOTE — Telephone Encounter (Signed)
Requested Prescriptions  Refused Prescriptions Disp Refills   escitalopram (LEXAPRO) 10 MG tablet [Pharmacy Med Name: ESCITALOPRAM OXALATE 10MG  TABLET] 30 tablet 0    Sig: TAKE ONE TABLET BY MOUTH ONCE DAILY     Psychiatry:  Antidepressants - SSRI Passed - 03/16/2023  9:07 AM      Passed - Completed PHQ-2 or PHQ-9 in the last 360 days      Passed - Valid encounter within last 6 months    Recent Outpatient Visits           4 days ago Encounter for general adult medical examination with abnormal findings   [place] [facility] [place], [place], NP   1 month ago Gastroesophageal reflux disease without esophagitis   [place] [facility] [place], Salvadore Oxford, NP   11 months ago Anxiety and depression   [place] [facility] [place], Salvadore Oxford, NP   2 years ago Anxiety and depression   [facility] [facility] Gabriel Cirri, NP   2 years ago Frequent headaches   [place] [facility] [place], Jodelle Gross, FNP       Future Appointments             In 5 months Baity, Salvadore Oxford, NP  [facility], [facility]

## 2023-04-06 MED ORDER — ONDANSETRON HCL 4 MG PO TABS: 4.0000 mg | ORAL_TABLET | Freq: Three times a day (TID) | ORAL | 0 refills | Status: DC | PRN

## 2023-04-06 NOTE — Addendum Note (Signed)
Addended by: Paschal Dopp on: 04/06/2023 08:39 AM   Modules accepted: Orders

## 2023-04-09 MED ORDER — TIRZEPATIDE-WEIGHT MANAGEMENT 5 MG/0.5ML ~~LOC~~ SOAJ: 5.0000 mg | SUBCUTANEOUS | 0 refills | Status: DC

## 2023-04-09 NOTE — Addendum Note (Signed)
Addended by: Lorre Munroe on: 04/09/2023 08:36 AM   Modules accepted: Orders

## 2023-04-30 ENCOUNTER — Emergency Department: Payer: Commercial Managed Care - PPO

## 2023-04-30 ENCOUNTER — Other Ambulatory Visit: Payer: Self-pay

## 2023-04-30 ENCOUNTER — Emergency Department
Admission: EM | Admit: 2023-04-30 | Discharge: 2023-04-30 | Disposition: A | Discharge status: Home / Self Care | Payer: Commercial Managed Care - PPO | Attending: Emergency Medicine | Admitting: Emergency Medicine

## 2023-04-30 DIAGNOSIS — R112 Nausea with vomiting, unspecified: Secondary | ICD-10-CM | POA: Diagnosis present

## 2023-04-30 DIAGNOSIS — K529 Noninfective gastroenteritis and colitis, unspecified: Secondary | ICD-10-CM

## 2023-04-30 DIAGNOSIS — R079 Chest pain, unspecified: Secondary | ICD-10-CM | POA: Insufficient documentation

## 2023-04-30 DIAGNOSIS — R1013 Epigastric pain: Secondary | ICD-10-CM

## 2023-04-30 DIAGNOSIS — A09 Infectious gastroenteritis and colitis, unspecified: Secondary | ICD-10-CM | POA: Insufficient documentation

## 2023-04-30 LAB — URINALYSIS, ROUTINE W REFLEX MICROSCOPIC
Bacteria, UA: NONE SEEN
Bilirubin Urine: NEGATIVE
Glucose, UA: NEGATIVE mg/dL
Ketones, ur: 80 mg/dL — AB
Leukocytes,Ua: NEGATIVE
Nitrite: NEGATIVE
Protein, ur: 30 mg/dL — AB
Specific Gravity, Urine: 1.028 (ref 1.005–1.030)
pH: 5 (ref 5.0–8.0)

## 2023-04-30 LAB — LIPASE, BLOOD: Lipase: 46 U/L (ref 11–51)

## 2023-04-30 LAB — CBC
HCT: 39.8 % (ref 36.0–46.0)
Hemoglobin: 13.1 g/dL (ref 12.0–15.0)
MCH: 26 pg (ref 26.0–34.0)
MCHC: 32.9 g/dL (ref 30.0–36.0)
MCV: 79 fL — ABNORMAL LOW (ref 80.0–100.0)
Platelets: 394 10*3/uL (ref 150–400)
RBC: 5.04 MIL/uL (ref 3.87–5.11)
RDW: 14.9 % (ref 11.5–15.5)
WBC: 7.4 10*3/uL (ref 4.0–10.5)
nRBC: 0 % (ref 0.0–0.2)

## 2023-04-30 LAB — BASIC METABOLIC PANEL
Anion gap: 13 (ref 5–15)
BUN: 15 mg/dL (ref 6–20)
CO2: 20 mmol/L — ABNORMAL LOW (ref 22–32)
Calcium: 9.8 mg/dL (ref 8.9–10.3)
Chloride: 106 mmol/L (ref 98–111)
Creatinine, Ser: 0.74 mg/dL (ref 0.44–1.00)
GFR, Estimated: >60 mL/min (ref >60)
Glucose, Bld: 94 mg/dL (ref 70–99)
Potassium: 3.3 mmol/L — ABNORMAL LOW (ref 3.5–5.1)
Sodium: 139 mmol/L (ref 135–145)

## 2023-04-30 LAB — TROPONIN I (HIGH SENSITIVITY): Troponin I (High Sensitivity): 2 ng/L (ref <18)

## 2023-04-30 LAB — POC URINE PREG, ED: Preg Test, Ur: NEGATIVE

## 2023-04-30 MED ORDER — IOHEXOL 300 MG/ML  SOLN
100.0000 mL | Freq: Once | INTRAMUSCULAR | Status: AC | PRN
  Administered 2023-04-30: 100 mL via INTRAVENOUS

## 2023-04-30 MED ORDER — KETOROLAC TROMETHAMINE 30 MG/ML IJ SOLN
15.0000 mg | Freq: Once | INTRAMUSCULAR | Status: AC
  Administered 2023-04-30: 15 mg via INTRAVENOUS
  Filled 2023-04-30: qty 1

## 2023-04-30 MED ORDER — METOCLOPRAMIDE HCL 5 MG/ML IJ SOLN
10.0000 mg | Freq: Once | INTRAMUSCULAR | Status: AC
  Administered 2023-04-30: 10 mg via INTRAVENOUS
  Filled 2023-04-30: qty 2

## 2023-04-30 NOTE — ED Provider Notes (Signed)
[facility] Emergency Department Provider Note     Event Date/Time   First MD Initiated Contact with Patient 04/30/23 1547     (approximate)   History   Chest Pain and Abdominal Pain   HPI  Sonya Spencer is a 37 y.o. female with a history of anxiety, depression, GERD, HLD, obesity, status post cholecystectomy in 2020, who presents to the ED for evaluation of chest pain with associated nausea, vomiting, diarrhea.  Patient would also note onset of nausea and her extremities when symptoms began at about 9 AM.  The chest pain she describes as a pressure with intermittent stabbing and goes straight through to her back.  She does endorse symptoms are similar to when she had her first gallbladder attack.  No reports of any frank fevers, cough, or congestion.  Patient denies any bladder or bowel changes at this time.  Physical Exam   Triage Vital Signs: ED Triage Vitals [04/30/23 1305]  Encounter Vitals Group     BP 108/77     Systolic BP Percentile      Diastolic BP Percentile      Pulse Rate (!) 102     Resp 20     Temp 98 F (36.7 C)     Temp Source Oral     SpO2 99 %     Weight 168 lb (76.2 kg)     Height 5\' 3"  (1.6 m)     Head Circumference      Peak Flow      Pain Score 10     Pain Loc      Pain Education      Exclude from Growth Chart     Most recent vital signs: Vitals:   04/30/23 1717 04/30/23 2128  BP: 110/78 114/77  Pulse: 90 88  Resp: 18 18  Temp: 98 F (36.7 C)   SpO2: 99% 99%    General Awake, no distress. NAD HEENT NCAT. PERRL. EOMI. no scleral icterus appreciated no rhinorrhea. Mucous membranes are moist.  CV:  Good peripheral perfusion. RRR RESP:  Normal effort. CTA ABD:  No distention.  Tender to palpation to the epigastrium.  Normoactive bowel sounds x 4.  No rigidity, rebound, organomegaly noted.  No CVA tenderness elicited.   ED Results / Procedures / Treatments   Labs (all labs ordered are listed, but only  abnormal results are displayed) Labs Reviewed  BASIC METABOLIC PANEL - Abnormal; Notable for the following components:      Result Value   Potassium 3.3 (*)    CO2 20 (*)    All other components within normal limits  CBC - Abnormal; Notable for the following components:   MCV 79.0 (*)    All other components within normal limits  URINALYSIS, ROUTINE W REFLEX MICROSCOPIC - Abnormal; Notable for the following components:   Color, Urine YELLOW (*)    APPearance HAZY (*)    Hgb urine dipstick MODERATE (*)    Ketones, ur 80 (*)    Protein, ur 30 (*)    All other components within normal limits  LIPASE, BLOOD  POC URINE PREG, ED  TROPONIN I (HIGH SENSITIVITY)     EKG  Vent. rate 87 BPM PR interval 146 ms QRS duration 74 ms QT/QTcB 378/454 ms P-R-T axes 59 46 11 Normal sinus rhythm T wave abnormality, consider inferior ischemia No STEMI  RADIOLOGY  I personally viewed and evaluated these images as part of my medical decision  making, as well as reviewing the written report by the radiologist.  ED Provider Interpretation: no acute findings  CXR  IMPRESSION: No active cardiopulmonary disease.  [place] ABD RUQ  IMPRESSION: Surgical changes of prior cholecystectomy. No evidence of biliary ductal dilatation. Normal sonographic appearance of the liver.   CT ABD / PELVIS w/ CM  IMPRESSION: No bowel obstruction, free air or free fluid. Scattered stool. Normal appendix. Previous cholecystectomy.    PROCEDURES:  Critical Care performed: No  Procedures   MEDICATIONS ORDERED IN ED: Medications  iohexol (OMNIPAQUE) 300 MG/ML solution 100 mL (100 mLs Intravenous Contrast Given 04/30/23 1837)  metoCLOPramide (REGLAN) injection 10 mg (10 mg Intravenous Given 04/30/23 2049)  ketorolac (TORADOL) 30 MG/ML injection 15 mg (15 mg Intravenous Given 04/30/23 2051)     IMPRESSION / MDM / ASSESSMENT AND PLAN / ED COURSE  I reviewed the triage vital signs and the nursing notes.                               Differential diagnosis includes, but is not limited to, ACS, aortic dissection, pulmonary embolism, cardiac tamponade, pneumothorax, pneumonia, pericarditis, myocarditis, GI-related causes including esophagitis/gastritis, and musculoskeletal chest wall pain.    Patient's presentation is most consistent with acute complicated illness / injury requiring diagnostic workup.  Patient's diagnosis is consistent with epigastric abdominal pain without evidence of acute biliary dilatation and nonspecific chest pain.  With reassuring exam and workup at this time without evidence of acute coronary syndrome, thoracic process, or sepsis.  Chest x-ray is negative for any intrathoracic findings.  Ultrasound of the abdomen not reveal any acute biliary dilatation with an absent gallbladder.  CT imaging reassuring overall this is shows no acute intra-abdominal process to explain the patient's symptoms.  No evidence of any acute pancreatitis or appendicitis.  Patient with a stable case and presentation at this time stable for discharge home for outpatient management.  Patient likely represent a viral etiology presumed infectious gastroenteritis.  Patient will be discharged home with directions to take her home medications as prescribed. Patient is to follow up with her PCP as needed or otherwise directed. Patient is given ED precautions to return to the ED for any worsening or new symptoms.  FINAL CLINICAL IMPRESSION(S) / ED DIAGNOSES   Final diagnoses:  Epigastric pain  Gastroenteritis presumed infectious     Rx / DC Orders   ED Discharge Orders     None        Note:  This document was prepared using Dragon voice recognition software and may include unintentional dictation errors.    Lissa Hoard, PA-C 05/03/23 1858    Merwyn Katos, MD 05/03/23 [phone]

## 2023-04-30 NOTE — Discharge Instructions (Signed)
Your exam, labs, ultrasound, and CT are normal and reassuring at this time.  Symptoms are likely due to a viral gastroenteritis.  Take your home medications including the prescription nausea medicine as directed.  Follow-up with your primary provider or GI medicine for ongoing concerns.

## 2023-04-30 NOTE — ED Triage Notes (Signed)
Pt presents to the ED POV from home. States that her friend dropped her off. Pt states that she started having chest pain, nausea, vomiting, diarrhea, and numbness in all extremities around 9am today. Reports the chest pain the be a pressure/ stabbing that goes straight through to her back. Pt states that she had similar pain with her gallbladder but that she had it removed.

## 2023-05-04 ENCOUNTER — Telehealth: Payer: Self-pay

## 2023-05-04 NOTE — Transitions of Care (Post Inpatient/ED Visit) (Signed)
   05/04/2023  Name: MANVEER BISSET MRN: [id number] DOB: 05/20/1986  Today's TOC FU Call Status: Today's TOC FU Call Status:: Successful TOC FU Call Completed TOC FU Call Complete Date: 05/04/23  Transition Care Management Follow-up Telephone Call Date of Discharge: 04/30/23 Discharge Facility: [facility]) Type of Discharge: Emergency Department How have you been since you were released from the hospital?: Better Any questions or concerns?: No  Items Reviewed: Did you receive and understand the discharge instructions provided?: Yes Medications obtained,verified, and reconciled?: Yes (Medications Reviewed) Any new allergies since your discharge?: No Dietary orders reviewed?: NA Do you have support at home?: Yes People in Home: spouse  Medications Reviewed Today: Medications Reviewed Today   Medications were not reviewed in this encounter     Home Care and Equipment/Supplies: Were Home Health Services Ordered?: NA Any new equipment or medical supplies ordered?: NA  Functional Questionnaire: Do you need assistance with bathing/showering or dressing?: No Do you need assistance with meal preparation?: No Do you need assistance with eating?: No Do you have difficulty maintaining continence: No Do you need assistance with getting out of bed/getting out of a chair/moving?: No Do you have difficulty managing or taking your medications?: No  Follow up appointments reviewed: PCP Follow-up appointment confirmed?: NA Specialist Hospital Follow-up appointment confirmed?: NA Do you need transportation to your follow-up appointment?: No Do you understand care options if your condition(s) worsen?: Yes-patient verbalized understanding    Oneal Grout, [facility]) Lutricia Horsfall [facility] [phone]

## 2023-05-08 ENCOUNTER — Other Ambulatory Visit: Payer: Self-pay | Admitting: Internal Medicine

## 2023-05-08 MED ORDER — TIRZEPATIDE-WEIGHT MANAGEMENT 5 MG/0.5ML ~~LOC~~ SOAJ: 5.0000 mg | SUBCUTANEOUS | 0 refills | Status: DC

## 2023-05-08 NOTE — Addendum Note (Signed)
Addended by: Lorre Munroe on: 05/08/2023 03:22 PM   Modules accepted: Orders

## 2023-05-12 MED ORDER — OMEPRAZOLE 20 MG PO CPDR: 20.0000 mg | DELAYED_RELEASE_CAPSULE | Freq: Every day | ORAL | 1 refills | Status: DC

## 2023-05-12 NOTE — Addendum Note (Signed)
Addended by: Lorre Munroe on: 05/12/2023 10:24 AM   Modules accepted: Orders

## 2023-06-16 ENCOUNTER — Encounter: Payer: Self-pay | Admitting: Internal Medicine

## 2023-06-16 ENCOUNTER — Ambulatory Visit (INDEPENDENT_AMBULATORY_CARE_PROVIDER_SITE_OTHER): Payer: Commercial Managed Care - PPO | Admitting: Internal Medicine

## 2023-06-16 VITALS — BP 126/78 | HR 70 | Temp 96.4°F | Ht 62.0 in | Wt 152.0 lb

## 2023-06-16 DIAGNOSIS — R82998 Other abnormal findings in urine: Secondary | ICD-10-CM

## 2023-06-16 DIAGNOSIS — R112 Nausea with vomiting, unspecified: Secondary | ICD-10-CM

## 2023-06-16 DIAGNOSIS — R1010 Upper abdominal pain, unspecified: Secondary | ICD-10-CM

## 2023-06-16 DIAGNOSIS — R198 Other specified symptoms and signs involving the digestive system and abdomen: Secondary | ICD-10-CM

## 2023-06-16 LAB — POCT URINALYSIS DIPSTICK
Bilirubin, UA: NEGATIVE
Blood, UA: NEGATIVE
Glucose, UA: NEGATIVE
Ketones, UA: NEGATIVE
Leukocytes, UA: NEGATIVE
Nitrite, UA: NEGATIVE
Protein, UA: POSITIVE — AB
Spec Grav, UA: 1.025 (ref 1.010–1.025)
Urobilinogen, UA: 0.2 U/dL
pH, UA: 6 (ref 5.0–8.0)

## 2023-06-16 NOTE — Patient Instructions (Signed)
Abdominal Pain, Adult  Many things can cause belly (abdominal) pain. In most cases, belly pain is not a serious problem and can be watched and treated at home. But in some cases, it can be serious. Your doctor will try to find the cause of your belly pain. Follow these instructions at home: Medicines Take over-the-counter and prescription medicines only as told by your doctor. Do not take medicines that help you poop (laxatives) unless told by your doctor. General instructions Watch your belly pain for any changes. Tell your doctor if the pain gets worse. Drink enough fluid to keep your pee (urine) pale yellow. Contact a doctor if: Your belly pain changes or gets worse. You have very bad cramping or bloating in your belly. You vomit. Your pain gets worse with meals, after eating, or with certain foods. You have trouble pooping or have watery poop for more than 2-3 days. You are not hungry, or you lose weight without trying. You have signs of not getting enough fluid or water (dehydration). These may include: Dark pee, very little pee, or no pee. Cracked lips or dry mouth. Feeling sleepy or weak. You have pain when you pee or poop. Your belly pain wakes you up at night. You have blood in your pee. You have a fever. Get help right away if: You cannot stop vomiting. Your pain is only in one part of your belly, like on the right side. You have bloody or black poop, or poop that looks like tar. You have trouble breathing. You have chest pain. These symptoms may be an emergency. Get help right away. Call 911. Do not wait to see if the symptoms will go away. Do not drive yourself to the hospital. This information is not intended to replace advice given to you by your health care provider. Make sure you discuss any questions you have with your health care provider. Document Revised: 07/02/2022 Document Reviewed: 07/02/2022 Elsevier Patient Education  2024 [employer].

## 2023-06-16 NOTE — Progress Notes (Signed)
Subjective:    Patient ID: Gareth Eagle, female    DOB: 11-23-1985, 37 y.o.   MRN: [id number]  HPI  Discussed the use of AI scribe software for clinical note transcription with the patient, who gave verbal consent to proceed.  History of Present Illness   The patient, with a history of cholecystectomy in 2020, presents with recurrent episodes of abdominal pain, nausea, vomiting, and diarrhea. The episodes, which began in August, last anywhere from two to six hours and are characterized by severe abdominal pain, chest pressure, and the need to defecate. The patient describes the pain as similar to that experienced during an gallbladder attack. The episodes are often accompanied by a tingling sensation, similar to that experienced when a limb 'falls asleep.' The patient also reports vomiting during these episodes, often needing to induce vomiting to relieve the sensation of something being 'stuck.' She was seen in the ER 8/1 for the same. ECG did not show any acute findings. RUQ abdominal ultrasound and CT abd/pelvis did not show any acute findings. She was diagnosed with a viral GI illness at that time and advised to follow up with her PCP.  The patient was on Zepbound from June, starting at 2.5mg  and increasing to 5mg  in July. The first episode occurred three weeks after the dose increase. The patient has since discontinued the medication, with the last dose taken approximately one to two weeks ago. Despite discontinuation, the patient experienced another episode yesterday.  The patient also reports alternating constipation and diarrhea since her cholecystectomy, requiring the use of enemas once or twice a week. Over-the-counter stool softeners provide some relief. The patient has not been taking prescribed omeprazole, believing it exacerbates the symptoms.       Review of Systems     Past Medical History:  Diagnosis Date   Abnormal glucose tolerance test (GTT) during pregnancy,  antepartum 12/23/2017   - pt check BG log.   - Lifestyles referral for presumptive GDM (also due to inordinate weight loss early in pregnancy) - will treat as GDM.  Patient has refused 3 hour gtt.     Allergy    Anemia    with pregnancy only   Anxiety    Asthma    only with pregnancy   Cholelithiasis    Complication of anesthesia    panic attack with first panic   Depression    Fetal macrosomia during pregnancy in third trimester 03/25/2015   Resolved with delivery    Frequent headaches    migraines with pregnancy   GERD (gastroesophageal reflux disease)    Gestational diabetes    Gestational diabetes mellitus (GDM) affecting third pregnancy 02/05/2018   H/O maternal third degree perineal laceration, currently pregnant    History of Papanicolaou smear of cervix 10/10/11; 08/02/14   neg, ct neg; neg ct//gc/tr neg   PONV (postoperative nausea and vomiting)    Supervision of normal pregnancy 11/09/2017   [facility]inic [facility] Prenatal Labs Dating  Blood type:    Genetic Screen 1 Screen:    AFP:     Quad:     NIPS: Antibody:  Anatomic [place]  Rubella:   Varicella:   GTT Early:               Third trimester:  RPR:    Rhogam  HBsAg:    TDaP vaccine                       Flu Shot: HIV:  Baby Food                                GBS:  Contraception  Pap: CBB    CS/VBAC   Support Person         Urinary incontinence     Current Outpatient Medications  Medication Sig Dispense Refill   albuterol (VENTOLIN HFA) 108 (90 Base) MCG/ACT inhaler TAKE TWO PUFFS BY MOUTH EVERY SIX HOURS AS NEEDED FOR WHEEZE 8.5 g 0   buPROPion (WELLBUTRIN XL) 150 MG 24 hr tablet Take 2 tablets (300 mg total) by mouth daily. 180 tablet 1   cetirizine (ZYRTEC) 10 MG tablet Take 1 tablet (10 mg total) by mouth every evening. 90 tablet 3   escitalopram (LEXAPRO) 20 MG tablet Take 1 tablet (20 mg total) by mouth daily. 90 tablet 1   omeprazole (PRILOSEC) 20 MG capsule Take 1 capsule (20 mg total) by mouth daily. 90 capsule 1    ondansetron (ZOFRAN) 4 MG tablet Take 1 tablet (4 mg total) by mouth every 8 (eight) hours as needed for nausea or vomiting. 20 tablet 0   tirzepatide (ZEPBOUND) 5 MG/0.5ML Pen Inject 5 mg into the skin once a week. 2 mL 0   No current facility-administered medications for this visit.    Allergies  Allergen Reactions   Oxycodone Other (See Comments)    migraines   Nickel Rash   Red Dye #40 (Allura Red) Rash    Includes Benadryl as it has red dye in it.    Family History  Problem Relation Age of Onset   Lupus Mother    Hepatitis C Mother    Depression Mother    Diabetes type I Father    Thyroid disease Father    Lupus Sister    Heart attack Sister    CVA Sister    Lupus Maternal Grandmother    Diabetes Maternal Grandmother    Diabetes Paternal Grandfather        type 1   Diabetes type I Daughter    Diabetes Daughter    Lupus Maternal Aunt    Diabetes Paternal Aunt        type 1 - runs in father's side    Social History   Socioeconomic History   Marital status: Legally Separated    Spouse name: Not on file   Number of children: 3   Years of education: 14   Highest education level: Not on file  Occupational History   Occupation: HOMEMAKER  Tobacco Use   Smoking status: Former    Current packs/day: 0.00    Average packs/day: 0.3 packs/day for 2.0 years (0.5 ttl pk-yrs)    Types: Cigarettes    Start date: 03/14/2007    Quit date: 03/13/2009    Years since quitting: 14.2   Smokeless tobacco: Never  Vaping Use   Vaping status: Some Days   Substances: CBD  Substance and Sexual Activity   Alcohol use: Not Currently    Alcohol/week: 3.0 standard drinks of alcohol    Types: 3 Shots of liquor per week    Comment: x 3 daily    Drug use: No   Sexual activity: Yes    Birth control/protection: Surgical    Comment: Tubal Ligation  Other Topics Concern   Not on file  Social History Narrative   Not on file   Social Determinants of Health   Financial Resource  Strain:  Not on file  Food Insecurity: Not on file  Transportation Needs: Not on file  Physical Activity: Not on file  Stress: Not on file  Social Connections: Not on file  Intimate Partner Violence: Not on file     Constitutional: Denies fever, malaise, fatigue, headache or abrupt weight changes.  Respiratory: Denies difficulty breathing, shortness of breath, cough or sputum production.   Cardiovascular: Denies chest pain, chest tightness, palpitations or swelling in the hands or feet.  Gastrointestinal: Pt reports abdominal pain, nausea, vomiting, alternating constipation and diarrhea. Denies abdominal pain, bloating, or blood in the stool.  GU: Pt reports dark urine. Denies urgency, frequency, pain with urination, burning sensation, blood in urine, odor or discharge. Musculoskeletal: Denies decrease in range of motion, difficulty with gait, muscle pain or joint pain and swelling.  Skin: Denies redness, rashes, lesions or ulcercations.  Neurological: Pt reports tingling in upper extremities. Denies dizziness, numbness, weakness or problems with balance and coordination.    No other specific complaints in a complete review of systems (except as listed in HPI above).  Objective:   Physical Exam  BP 126/78 (BP Location: Right Arm, Patient Position: Sitting, Cuff Size: Normal)   Pulse 70   Temp (!) 96.4 F (35.8 C) (Temporal)   Ht 5\' 2"  (1.575 m)   Wt 152 lb (68.9 kg)   SpO2 96%   BMI 27.80 kg/m   Wt Readings from Last 3 Encounters:  06/16/23 152 lb (68.9 kg)  04/30/23 168 lb (76.2 kg)  03/12/23 189 lb (85.7 kg)    General: Appears her stated age, overweight, in NAD. Skin: Warm, dry and intact.  Cardiovascular: Normal rate and rhythm. S1,S2 noted.  No murmur, rubs or gallops noted. No JVD or BLE edema. No carotid bruits noted. Pulmonary/Chest: Normal effort and positive vesicular breath sounds. No respiratory distress. No wheezes, rales or ronchi noted.  Abdomen: Soft and  generally tender in LUQ, RUQ, and epigastric region. Normal bowel sounds. No distention or masses noted. Liver, spleen and kidneys non palpable. Musculoskeletal: No difficulty with gait.  Neurological: Alert and oriented.    BMET    Component Value Date/Time   NA 139 04/30/2023 1313   NA 138 10/08/2014 1732   K 3.3 (L) 04/30/2023 1313   K 3.6 10/08/2014 1732   CL 106 04/30/2023 1313   CL 106 10/08/2014 1732   CO2 20 (L) 04/30/2023 1313   CO2 23 10/08/2014 1732   GLUCOSE 94 04/30/2023 1313   GLUCOSE 70 10/08/2014 1732   BUN 15 04/30/2023 1313   BUN 5 (L) 10/08/2014 1732   CREATININE 0.74 04/30/2023 1313   CREATININE 0.69 03/12/2023 0846   CALCIUM 9.8 04/30/2023 1313   CALCIUM 8.6 10/08/2014 1732   GFRNONAA >60 04/30/2023 1313   GFRNONAA 115 10/03/2019 1110   GFRAA >60 05/09/2020 0128   GFRAA 134 10/03/2019 1110    Lipid Panel     Component Value Date/Time   CHOL 236 (H) 03/12/2023 0846   TRIG 85 03/12/2023 0846   HDL 62 03/12/2023 0846   CHOLHDL 3.8 03/12/2023 0846   LDLCALC 155 (H) 03/12/2023 0846    CBC    Component Value Date/Time   WBC 7.4 04/30/2023 1313   RBC 5.04 04/30/2023 1313   HGB 13.1 04/30/2023 1313   HGB 11.2 04/22/2018 1054   HCT 39.8 04/30/2023 1313   HCT 33.3 (L) 04/22/2018 1054   PLT 394 04/30/2023 1313   PLT 250 04/22/2018 1054   MCV 79.0 (L)  04/30/2023 1313   MCV 88 04/22/2018 1054   MCV 88 10/08/2014 1732   MCH 26.0 04/30/2023 1313   MCHC 32.9 04/30/2023 1313   RDW 14.9 04/30/2023 1313   RDW 14.3 04/22/2018 1054   RDW 14.0 10/08/2014 1732   LYMPHSABS 3.1 11/12/2021 0846   LYMPHSABS 3.3 10/08/2014 1732   MONOABS 0.5 11/12/2021 0846   MONOABS 0.7 10/08/2014 1732   EOSABS 0.1 11/12/2021 0846   EOSABS 0.1 10/08/2014 1732   BASOSABS 0.1 11/12/2021 0846   BASOSABS 0.0 10/08/2014 1732    Hgb A1C Lab Results  Component Value Date   HGBA1C 5.5 03/12/2023           Assessment & Plan:   Assessment and Plan    Recurrent  Abdominal Pain and Vomiting Episodes of abdominal pain, chest pressure, nausea, vomiting, and diarrhea since August. Episodes last 2-6 hours and resolve spontaneously. Symptoms may be related to Zepbound, which was discontinued 1-2 weeks ago. -Order urinalysis, CBC, CMP, lipase, and H. pylori breath test today. -Consider referral to gastroenterology for further evaluation if labs are normal and symptoms persist.  Constipation Struggling with constipation, requiring enemas 1-2 times per week. -Recommend daily MiraLax instead of stool softeners or enemas.   Dark Urine -She reports that she is drinking adequate amounts of fluids -Urinalysis pending  Follow-up in December for re-evaluation of symptoms and weight management.        RTC in 3 months, follow-up chronic conditions Nicki Reaper, NP

## 2023-06-18 ENCOUNTER — Encounter: Payer: Self-pay | Admitting: Internal Medicine

## 2023-06-18 DIAGNOSIS — R198 Other specified symptoms and signs involving the digestive system and abdomen: Secondary | ICD-10-CM

## 2023-06-18 DIAGNOSIS — R1010 Upper abdominal pain, unspecified: Secondary | ICD-10-CM

## 2023-06-18 DIAGNOSIS — R112 Nausea with vomiting, unspecified: Secondary | ICD-10-CM

## 2023-06-18 LAB — COMPLETE METABOLIC PANEL WITH GFR
AG Ratio: 1.9 (calc) (ref 1.0–2.5)
ALT: 22 U/L (ref 6–29)
AST: 34 U/L — ABNORMAL HIGH (ref 10–30)
Albumin: 4.5 g/dL (ref 3.6–5.1)
Alkaline phosphatase (APISO): 59 U/L (ref 31–125)
BUN: 13 mg/dL (ref 7–25)
CO2: 25 mmol/L (ref 20–32)
Calcium: 9.5 mg/dL (ref 8.6–10.2)
Chloride: 105 mmol/L (ref 98–110)
Creat: 0.52 mg/dL (ref 0.50–0.97)
Globulin: 2.4 g/dL (calc) (ref 1.9–3.7)
Glucose, Bld: 69 mg/dL (ref 65–99)
Potassium: 3.7 mmol/L (ref 3.5–5.3)
Sodium: 139 mmol/L (ref 135–146)
Total Bilirubin: 0.5 mg/dL (ref 0.2–1.2)
Total Protein: 6.9 g/dL (ref 6.1–8.1)
eGFR: 125 mL/min/{1.73_m2} (ref >=60)

## 2023-06-18 LAB — CBC
HCT: 36.5 % (ref 35.0–45.0)
Hemoglobin: 11.6 g/dL — ABNORMAL LOW (ref 11.7–15.5)
MCH: 26.2 pg — ABNORMAL LOW (ref 27.0–33.0)
MCHC: 31.8 g/dL — ABNORMAL LOW (ref 32.0–36.0)
MCV: 82.4 fL (ref 80.0–100.0)
MPV: 11.3 fL (ref 7.5–12.5)
Platelets: 331 10*3/uL (ref 140–400)
RBC: 4.43 10*6/uL (ref 3.80–5.10)
RDW: 15.4 % — ABNORMAL HIGH (ref 11.0–15.0)
WBC: 6.7 10*3/uL (ref 3.8–10.8)

## 2023-06-18 LAB — H. PYLORI BREATH TEST: H. pylori Breath Test: NOT DETECTED

## 2023-06-18 LAB — LIPASE: Lipase: 91 U/L — ABNORMAL HIGH (ref 7–60)

## 2023-06-19 MED ORDER — OMEPRAZOLE 20 MG PO CPDR: 20.0000 mg | DELAYED_RELEASE_CAPSULE | Freq: Two times a day (BID) | ORAL | 1 refills | Status: DC

## 2023-06-19 NOTE — Addendum Note (Signed)
Addended by: Lorre Munroe on: 06/19/2023 02:08 PM   Modules accepted: Orders

## 2023-07-05 IMAGING — CR DG CHEST 2V
2 series · 2 of 2 positions shown · non-contrast
Comparison: 09/29/2018

CLINICAL DATA: Chest pain

EXAM:
CHEST - 2 VIEW

[chest pa]
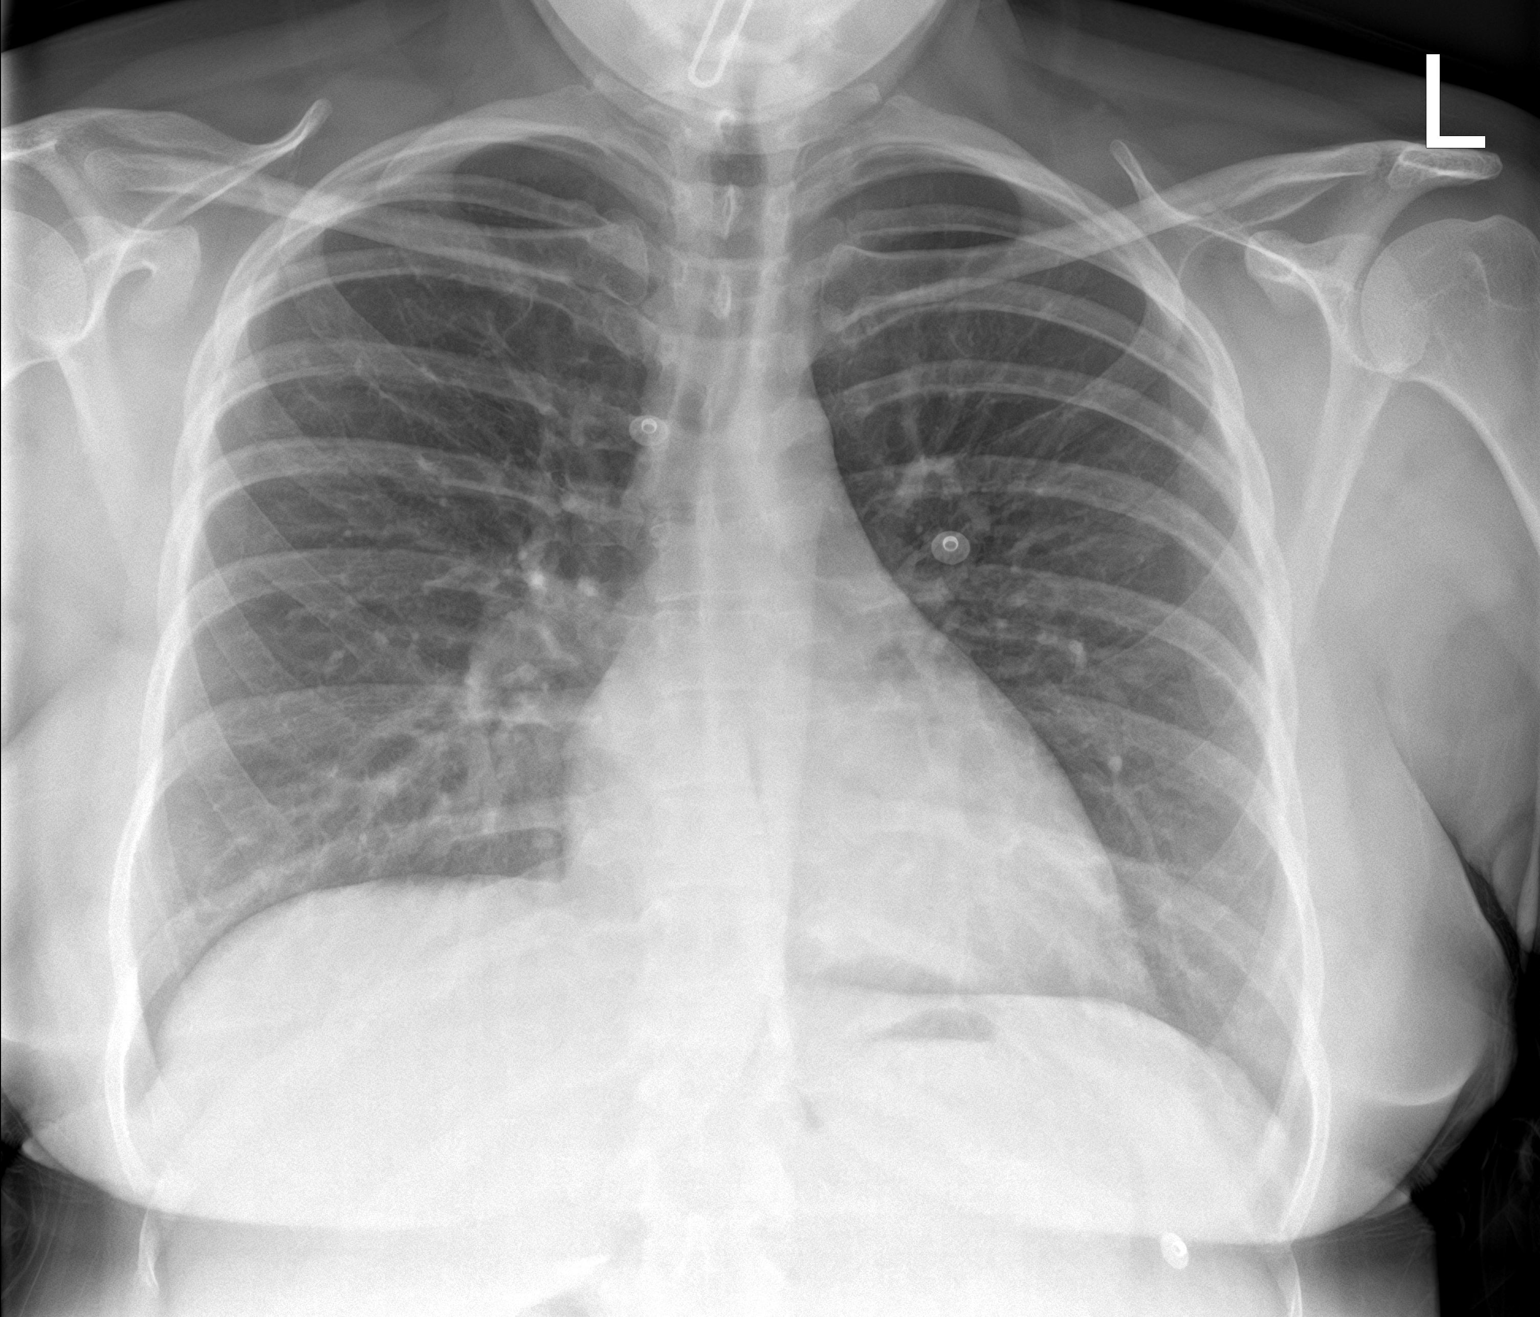

[chest lat]
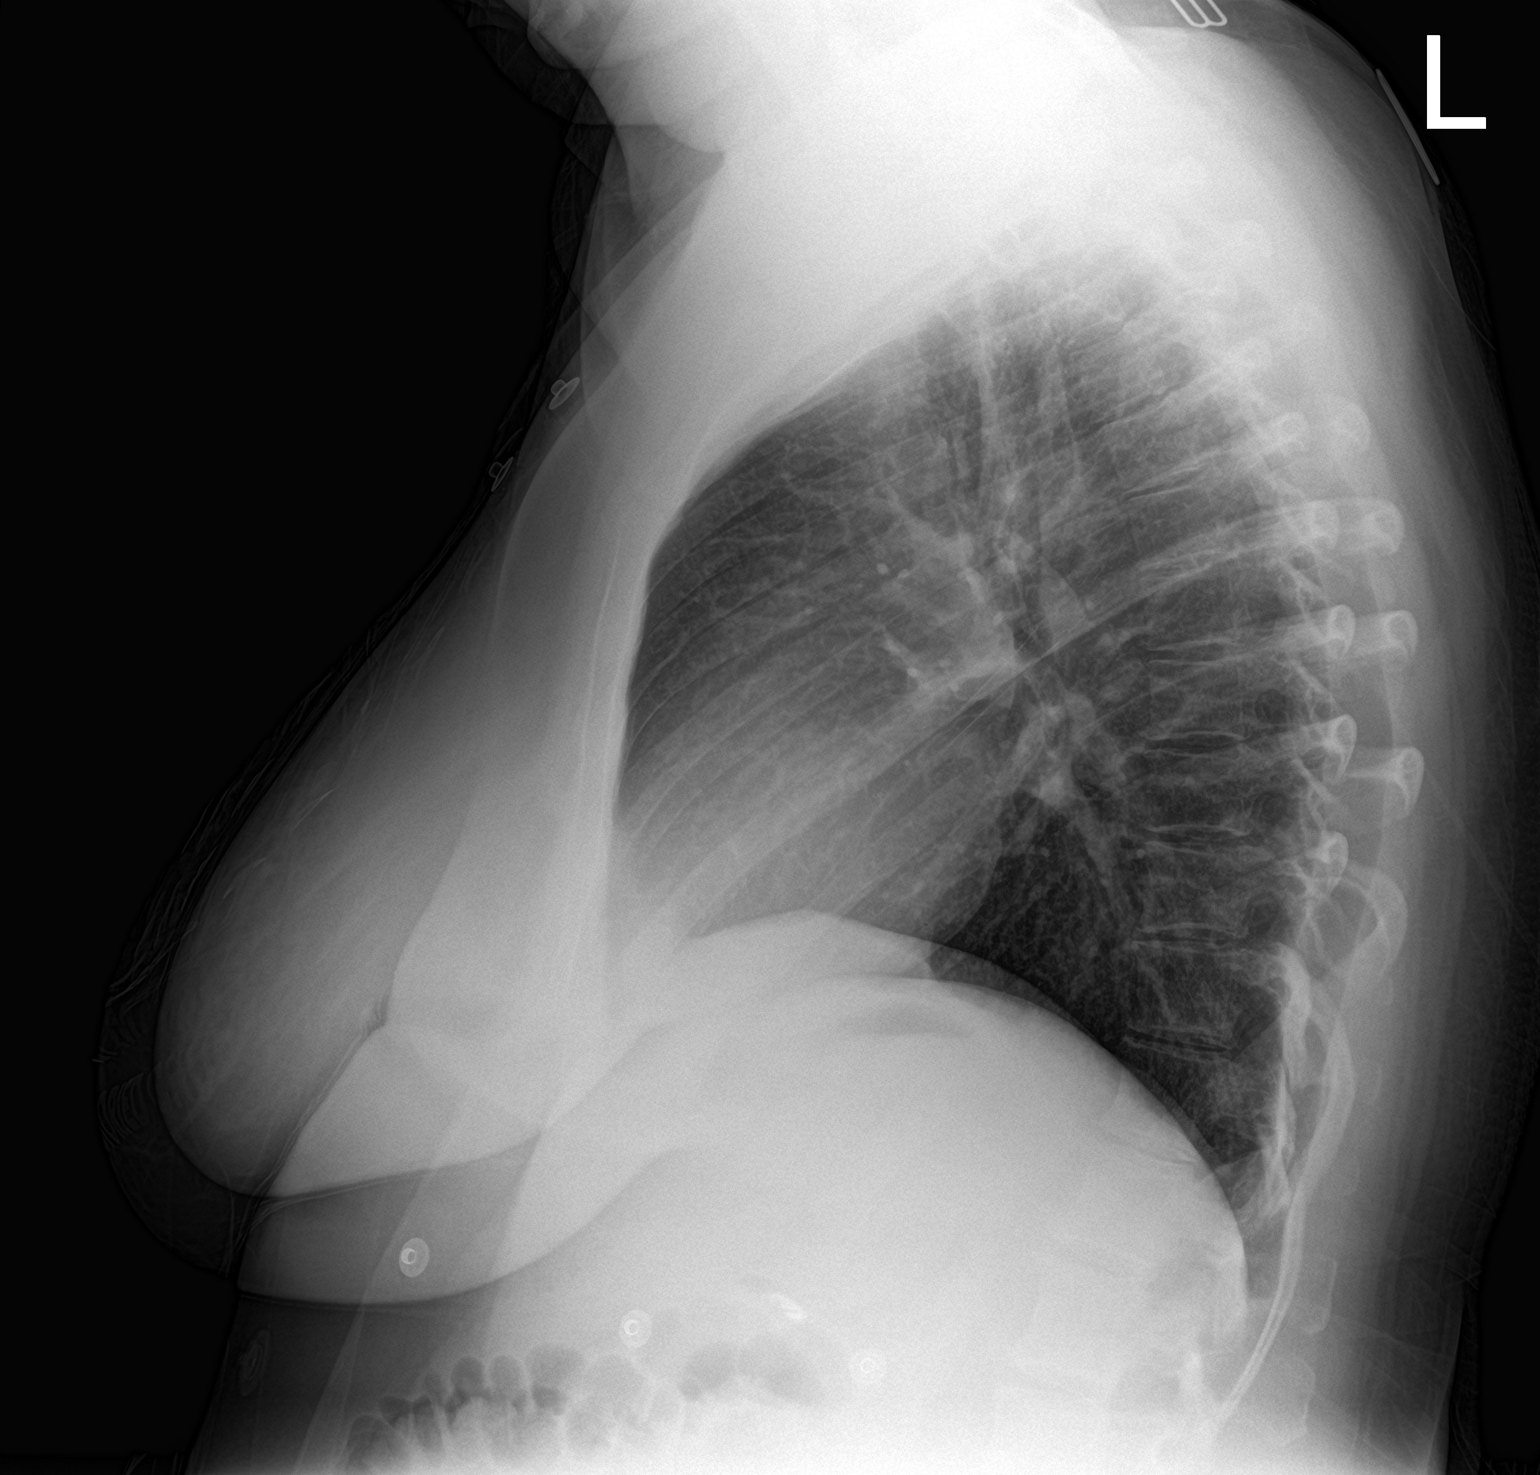

[2 of 2 positions shown; findings below may reference images not displayed]

FINDINGS: The heart size and mediastinal contours are within normal limits.
Both lungs are clear. The visualized skeletal structures are
unremarkable.
IMPRESSION: No active cardiopulmonary disease.

## 2023-08-02 NOTE — Progress Notes (Deleted)
Celso Amy, PA-C [address]  [address]  [place], [place] [address]  Main: [phone]  Fax: [phone]   Gastroenterology Consultation  Referring Provider:     Lorre Munroe, NP Primary Care Physician:  Lorre Munroe, NP Primary Gastroenterologist:  *** Reason for Consultation:     ***        HPI:   Sonya Spencer is a 37 y.o. y/o female referred for consultation & management  by Lorre Munroe, NP.  ***  Past Medical History:  Diagnosis Date   Abnormal glucose tolerance test (GTT) during pregnancy, antepartum 12/23/2017   - pt check BG log.   - Lifestyles referral for presumptive GDM (also due to inordinate weight loss early in pregnancy) - will treat as GDM.  Patient has refused 3 hour gtt.     Allergy    Anemia    with pregnancy only   Anxiety    Asthma    only with pregnancy   Cholelithiasis    Complication of anesthesia    panic attack with first panic   Depression    Fetal macrosomia during pregnancy in third trimester 03/25/2015   Resolved with delivery    Frequent headaches    migraines with pregnancy   GERD (gastroesophageal reflux disease)    Gestational diabetes    Gestational diabetes mellitus (GDM) affecting third pregnancy 02/05/2018   H/O maternal third degree perineal laceration, currently pregnant    History of Papanicolaou smear of cervix 10/10/11; 08/02/14   neg, ct neg; neg ct//gc/tr neg   PONV (postoperative nausea and vomiting)    Supervision of normal pregnancy 11/09/2017   [facility] Prenatal Labs Dating  Blood type:    Genetic Screen 1 Screen:    AFP:     Quad:     NIPS: Antibody:  Anatomic [place]  Rubella:   Varicella:   GTT Early:               Third trimester:  RPR:    Rhogam  HBsAg:    TDaP vaccine                       Flu Shot: HIV:    Baby Food                                GBS:  Contraception  Pap: CBB    CS/VBAC   Support Person         Urinary incontinence     Past Surgical History:  Procedure Laterality Date    CESAREAN SECTION N/A 03/22/2015   Procedure: CESAREAN SECTION;  Surgeon: Conard Novak, MD;  Location: [facility] ORS;  Service: Obstetrics;  Laterality: N/A;   CESAREAN SECTION WITH BILATERAL TUBAL LIGATION N/A 06/24/2018   Procedure: CESAREAN SECTION WITH BILATERAL TUBAL LIGATION;  Surgeon: Conard Novak, MD;  Location: [facility] ORS;  Service: Obstetrics;  Laterality: N/A;   CHOLECYSTECTOMY N/A 10/11/2018   Procedure: LAPAROSCOPIC CHOLECYSTECTOMY;  Surgeon: Ancil Linsey, MD;  Location: [facility] ORS;  Service: General;  Laterality: N/A;   FOREIGN BODY REMOVAL     MOUTH SURGERY      Prior to Admission medications   Medication Sig Start Date End Date Taking? Authorizing Provider  albuterol (VENTOLIN HFA) 108 (90 Base) MCG/ACT inhaler TAKE TWO PUFFS BY MOUTH EVERY SIX HOURS AS NEEDED FOR WHEEZE 12/18/22   Lorre Munroe,  NP  buPROPion (WELLBUTRIN XL) 150 MG 24 hr tablet Take 2 tablets (300 mg total) by mouth daily. 02/11/23   Lorre Munroe, NP  cetirizine (ZYRTEC) 10 MG tablet Take 1 tablet (10 mg total) by mouth every evening. 10/06/19   Karamalegos, Netta Neat, DO  escitalopram (LEXAPRO) 20 MG tablet Take 1 tablet (20 mg total) by mouth daily. 01/28/23   Lorre Munroe, NP  omeprazole (PRILOSEC) 20 MG capsule Take 1 capsule (20 mg total) by mouth 2 (two) times daily before a meal. 06/19/23   Baity, Salvadore Oxford, NP  ondansetron (ZOFRAN) 4 MG tablet Take 1 tablet (4 mg total) by mouth every 8 (eight) hours as needed for nausea or vomiting. 04/06/23   Lorre Munroe, NP  tirzepatide (ZEPBOUND) 5 MG/0.5ML Pen Inject 5 mg into the skin once a week. 05/08/23   Lorre Munroe, NP    Family History  Problem Relation Age of Onset   Lupus Mother    Hepatitis C Mother    Depression Mother    Diabetes type I Father    Thyroid disease Father    Lupus Sister    Heart attack Sister    CVA Sister    Lupus Maternal Grandmother    Diabetes Maternal Grandmother    Diabetes Paternal Grandfather        type 1    Diabetes type I Daughter    Diabetes Daughter    Lupus Maternal Aunt    Diabetes Paternal Aunt        type 1 - runs in father's side     Social History   Tobacco Use   Smoking status: Former    Current packs/day: 0.00    Average packs/day: 0.3 packs/day for 2.0 years (0.5 ttl pk-yrs)    Types: Cigarettes    Start date: 03/14/2007    Quit date: 03/13/2009    Years since quitting: 14.3   Smokeless tobacco: Never  Vaping Use   Vaping status: Some Days   Substances: CBD  Substance Use Topics   Alcohol use: Not Currently    Alcohol/week: 3.0 standard drinks of alcohol    Types: 3 Shots of liquor per week    Comment: x 3 daily    Drug use: No    Allergies as of 08/03/2023 - Review Complete 06/16/2023  Allergen Reaction Noted   Oxycodone Other (See Comments) 04/30/2023   Nickel Rash 03/14/2015   Red dye #40 (allura red) Rash 03/14/2015    Review of Systems:    All systems reviewed and negative except where noted in HPI.   Physical Exam:  There were no vitals taken for this visit. No LMP recorded.  General:   Alert,  Well-developed, well-nourished, pleasant and cooperative in NAD Lungs:  Respirations even and unlabored.  Clear throughout to auscultation.   No wheezes, crackles, or rhonchi. No acute distress. Heart:  Regular rate and rhythm; no murmurs, clicks, rubs, or gallops. Abdomen:  Normal bowel sounds.  No bruits.  Soft, and non-distended without masses, hepatosplenomegaly or hernias noted.  No Tenderness.  No guarding or rebound tenderness.    Neurologic:  Alert and oriented x3;  grossly normal neurologically. Psych:  Alert and cooperative. Normal mood and affect.  Imaging Studies: No results found.  Assessment and Plan:   Sonya Spencer is a 37 y.o. y/o female has been referred for ***  Follow up ***  Celso Amy, PA-C    BP check ***

## 2023-08-03 ENCOUNTER — Ambulatory Visit: Payer: Commercial Managed Care - PPO | Admitting: Physician Assistant

## 2023-08-06 ENCOUNTER — Other Ambulatory Visit: Payer: Self-pay | Admitting: Internal Medicine

## 2023-08-07 NOTE — Telephone Encounter (Signed)
Requested medication (s) are due for refill today:   Yes  Requested medication (s) are on the active medication list:   Yes  Future visit scheduled:   Yes 12/13   Last ordered: 05/08/2023 2 ml, 0 refills  No protocol assigned to this medication.    Pt. Requesting 2.5 mg be sent in.      Requested Prescriptions  Pending Prescriptions Disp Refills   ZEPBOUND 5 MG/0.5ML Pen [Pharmacy Med Name: ZEPBOUND 5/0.5ML SOLN AUTO-INJ] 2 mL 0    Sig: INJECT FIVE MG INTO THE SKIN ONCE A WEEK.     Off-Protocol Failed - 08/06/2023 12:46 PM      Failed - Medication not assigned to a protocol, review manually.      Passed - Valid encounter within last 12 months    Recent Outpatient Visits           1 month ago Upper abdominal pain   [place] [facility] [place], Salvadore Oxford, NP   4 months ago Encounter for general adult medical examination with abnormal findings   [place] [facility] [place], [place], NP   6 months ago Gastroesophageal reflux disease without esophagitis   [place] [facility], Salvadore Oxford, NP   1 year ago Anxiety and depression   [place] [facility] [place], Salvadore Oxford, NP   2 years ago Anxiety and depression   [place] [facility] Gabriel Cirri, NP       Future Appointments             In 1 month Baity, Salvadore Oxford, NP [place] [facility]

## 2023-08-13 ENCOUNTER — Other Ambulatory Visit: Payer: Self-pay

## 2023-08-13 ENCOUNTER — Emergency Department: Payer: Commercial Managed Care - PPO

## 2023-08-13 ENCOUNTER — Emergency Department
Admission: EM | Admit: 2023-08-13 | Discharge: 2023-08-13 | Disposition: A | Discharge status: Home / Self Care | Payer: Commercial Managed Care - PPO | Attending: Emergency Medicine | Admitting: Emergency Medicine

## 2023-08-13 ENCOUNTER — Encounter: Payer: Self-pay | Admitting: Intensive Care

## 2023-08-13 DIAGNOSIS — X58XXXA Exposure to other specified factors, initial encounter: Secondary | ICD-10-CM | POA: Diagnosis not present

## 2023-08-13 DIAGNOSIS — S43431A Superior glenoid labrum lesion of right shoulder, initial encounter: Secondary | ICD-10-CM | POA: Insufficient documentation

## 2023-08-13 DIAGNOSIS — M25511 Pain in right shoulder: Secondary | ICD-10-CM | POA: Diagnosis present

## 2023-08-13 DIAGNOSIS — S43431S Superior glenoid labrum lesion of right shoulder, sequela: Secondary | ICD-10-CM

## 2023-08-13 MED ORDER — CYCLOBENZAPRINE HCL 5 MG PO TABS: 5.0000 mg | ORAL_TABLET | Freq: Every day | ORAL | 0 refills | Status: DC

## 2023-08-13 MED ORDER — DICLOFENAC SODIUM 50 MG PO TBEC: 50.0000 mg | DELAYED_RELEASE_TABLET | Freq: Two times a day (BID) | ORAL | 1 refills | Status: DC

## 2023-08-13 MED ORDER — NAPROXEN 500 MG PO TABS
500.0000 mg | ORAL_TABLET | Freq: Once | ORAL | Status: DC
  Filled 2023-08-13: qty 1

## 2023-08-13 MED ORDER — DICLOFENAC SODIUM 75 MG PO TBEC
75.0000 mg | DELAYED_RELEASE_TABLET | Freq: Once | ORAL | Status: AC
  Administered 2023-08-13: 75 mg via ORAL
  Filled 2023-08-13: qty 1

## 2023-08-13 NOTE — Discharge Instructions (Addendum)
Your exam and x-ray are negative for any acute fracture or dislocation.  Likely have shoulder strain and tendinitis related to your underlying SLAP injury.  Follow-up with [facility] as discussed.  Take the prescription meds as directed.

## 2023-08-13 NOTE — ED Provider Notes (Signed)
[facility] Emergency Department Provider Note     Event Date/Time   First MD Initiated Contact with Patient 08/13/23 1859     (approximate)   History   Shoulder Pain   HPI  Sonya Spencer is a 37 y.o. female with a noncontributory medical history, presents to the ED for evaluation of right shoulder pain with ration to the fingers.  Patient gives a remote history of a SLAP tear to the right labrum from a Worker's Comp. injury.  That injury was never repaired surgically.  He presents to the ED today, noting some exacerbation of her symptoms at that she reached behind into the backseat to buckle her child into his car seat from the front.  She denies any chest pain or shortness of breath but does note some mild intermittent paresthesias to the right hand.   Physical Exam   Triage Vital Signs: ED Triage Vitals  Encounter Vitals Group     BP 08/13/23 1733 122/82     Systolic BP Percentile --      Diastolic BP Percentile --      Pulse Rate 08/13/23 1733 74     Resp 08/13/23 1733 16     Temp 08/13/23 1733 99.1 F (37.3 C)     Temp Source 08/13/23 1733 Oral     SpO2 08/13/23 1733 99 %     Weight 08/13/23 1734 168 lb (76.2 kg)     Height 08/13/23 1734 5\' 2"  (1.575 m)     Head Circumference --      Peak Flow --      Pain Score 08/13/23 1733 7     Pain Loc --      Pain Education --      Exclude from Growth Chart --     Most recent vital signs: Vitals:   08/13/23 1733  BP: 122/82  Pulse: 74  Resp: 16  Temp: 99.1 F (37.3 C)  SpO2: 99%    General Awake, no distress. NAD HEENT NCAT. PERRL. EOMI. No rhinorrhea. Mucous membranes are moist.  CV:  Good peripheral perfusion.  RESP:  Normal effort.  ABD:  No distention.  MSK:  Right shoulder without obvious deformity or dislocation.  Slow but active range of motion of all movements.  Normal composite fist distally.  No evidence of acute rotator cuff deficit.  ED Results / Procedures / Treatments    Labs (all labs ordered are listed, but only abnormal results are displayed) Labs Reviewed - No data to display   EKG   RADIOLOGY  I personally viewed and evaluated these images as part of my medical decision making, as well as reviewing the written report by the radiologist.  ED Provider Interpretation: No acute findings  DG Shoulder Right  Result Date: 08/13/2023 CLINICAL DATA:  Pain after injury. EXAM: RIGHT SHOULDER - 2+ VIEW COMPARISON:  None Available. FINDINGS: There is no evidence of fracture or dislocation. Acromioclavicular joint is normal. Glenohumeral joint space is preserved. No erosive change or focal bone abnormality. Soft tissues are unremarkable. IMPRESSION: Negative radiographs of the right shoulder. Electronically Signed   By: Narda Rutherford M.D.   On: 08/13/2023 18:47     PROCEDURES:  Critical Care performed: No  Procedures   MEDICATIONS ORDERED IN ED: Medications  diclofenac (VOLTAREN) EC tablet 75 mg (75 mg Oral Given 08/13/23 1948)     IMPRESSION / MDM / ASSESSMENT AND PLAN / ED COURSE  I reviewed the triage vital  signs and the nursing notes.                              Differential diagnosis includes, but is not limited to, sprain, tendinitis, fracture, dislocation myalgias  Patient's presentation is most consistent with acute complicated illness / injury requiring diagnostic workup.  Patient's diagnosis is consistent with acute right shoulder sprain with history of SLAP lesion without surgical repair. Patient will be discharged home with prescriptions for cyclobenzaprine and diclofenac. Patient is to follow up with [facility] as discussed, as needed or otherwise directed. Patient is given ED precautions to return to the ED for any worsening or new symptoms.   FINAL CLINICAL IMPRESSION(S) / ED DIAGNOSES   Final diagnoses:  Acute pain of right shoulder  Superior glenoid labrum lesion of right shoulder, sequela     Rx / DC Orders   ED  Discharge Orders          Ordered    diclofenac (VOLTAREN) 50 MG EC tablet  2 times daily        08/13/23 1926    cyclobenzaprine (FLEXERIL) 5 MG tablet  Daily at bedtime        08/13/23 1926             Note:  This document was prepared using Dragon voice recognition software and may include unintentional dictation errors.    Lissa Hoard, PA-C 08/14/23 1610    Phineas Semen, MD 08/18/23 [phone]

## 2023-08-13 NOTE — ED Notes (Signed)
See triage notes. Patient stated she reached behind her today and felt like something "tore more than it was." Patient c/o right shoulder pain

## 2023-08-13 NOTE — ED Triage Notes (Signed)
Patient c/o right shoulder pain with radiation to fingers. Reports originally injury was 2 years ago. Flared up today after reaching from front seat to help buckle her kids seat in the back.

## 2023-08-19 ENCOUNTER — Telehealth: Payer: Commercial Managed Care - PPO

## 2023-08-19 DIAGNOSIS — S43431A Superior glenoid labrum lesion of right shoulder, initial encounter: Secondary | ICD-10-CM | POA: Diagnosis not present

## 2023-08-19 MED ORDER — METHYLPREDNISOLONE 4 MG PO TBPK: ORAL_TABLET | ORAL | 0 refills | Status: DC

## 2023-08-19 NOTE — Patient Instructions (Signed)
Sonya Spencer, thank you for joining Margaretann Loveless, PA-C for today's virtual visit.  While this provider is not your primary care provider (PCP), if your PCP is located in our provider database this encounter information will be shared with them immediately following your visit.   A [facility] MyChart account gives you access to today's visit and all your visits, tests, and labs performed at [facility] " click here if you don't have a  MyChart account or go to [email or web address]  Consent: (Patient) Sonya Spencer provided verbal consent for this virtual visit at the beginning of the encounter.  Current Medications:  Current Outpatient Medications:    methylPREDNISolone (MEDROL DOSEPAK) 4 MG TBPK tablet, 6 day taper; take as directed on package instructions, Disp: 21 tablet, Rfl: 0   albuterol (VENTOLIN HFA) 108 (90 Base) MCG/ACT inhaler, TAKE TWO PUFFS BY MOUTH EVERY SIX HOURS AS NEEDED FOR WHEEZE, Disp: 8.5 g, Rfl: 0   buPROPion (WELLBUTRIN XL) 150 MG 24 hr tablet, Take 2 tablets (300 mg total) by mouth daily., Disp: 180 tablet, Rfl: 1   cetirizine (ZYRTEC) 10 MG tablet, Take 1 tablet (10 mg total) by mouth every evening., Disp: 90 tablet, Rfl: 3   cyclobenzaprine (FLEXERIL) 5 MG tablet, Take 1 tablet (5 mg total) by mouth at bedtime., Disp: 15 tablet, Rfl: 0   diclofenac (VOLTAREN) 50 MG EC tablet, Take 1 tablet (50 mg total) by mouth 2 (two) times daily., Disp: 60 tablet, Rfl: 1   escitalopram (LEXAPRO) 20 MG tablet, Take 1 tablet (20 mg total) by mouth daily., Disp: 90 tablet, Rfl: 1   omeprazole (PRILOSEC) 20 MG capsule, Take 1 capsule (20 mg total) by mouth 2 (two) times daily before a meal., Disp: 180 capsule, Rfl: 1   ondansetron (ZOFRAN) 4 MG tablet, Take 1 tablet (4 mg total) by mouth every 8 (eight) hours as needed for nausea or vomiting., Disp: 20 tablet, Rfl: 0   tirzepatide (ZEPBOUND) 5 MG/0.5ML Pen, Inject 5 mg into the skin once a week.,  Disp: 2 mL, Rfl: 0   Medications ordered in this encounter:  Meds ordered this encounter  Medications   methylPREDNISolone (MEDROL DOSEPAK) 4 MG TBPK tablet    Sig: 6 day taper; take as directed on package instructions    Dispense:  21 tablet    Refill:  0    Order Specific Question:   Supervising Provider    Answer:   Merrilee Jansky [id number]     *If you need refills on other medications prior to your next appointment, please contact your pharmacy*  Follow-Up: Call back or seek an in-person evaluation if the symptoms worsen or if the condition fails to improve as anticipated.  [facility] [phone]  Other Instructions  [facility] [address] [place], [place] [address] CONTACT Phone: [phone] Fax: [phone] URGENT CARE HOURS Monday-Sunday: 9:00AM - 9:00PM  HOLIDAY HOURS Holidays: 8:30AM - 4:30PM   Shoulder Exercises Ask your health care provider which exercises are safe for you. Do exercises exactly as told by your health care provider and adjust them as directed. It is normal to feel mild stretching, pulling, tightness, or discomfort as you do these exercises. Stop right away if you feel sudden pain or your pain gets worse. Do not begin these exercises until told by your health care provider. Stretching exercises External rotation and abduction This exercise is sometimes called corner stretch. The exercise rotates your arm outward (external  rotation) and moves your arm out from your body (abduction). Stand in a doorway with one of your feet slightly in front of the other. This is called a staggered stance. If you cannot reach your forearms to the door frame, stand facing a corner of a room. Choose one of the following positions as told by your health care provider: Place your hands and forearms on the door frame above your head. Place your hands and forearms on the door frame at the height of your head. Place  your hands on the door frame at the height of your elbows. Slowly move your weight onto your front foot until you feel a stretch across your chest and in the front of your shoulders. Keep your head and chest upright and keep your abdominal muscles tight. Hold for __________ seconds. To release the stretch, shift your weight to your back foot. Repeat __________ times. Complete this exercise __________ times a day. Extension, standing  Stand and hold a broomstick, a cane, or a similar object behind your back. Your hands should be a little wider than shoulder-width apart. Your palms should face away from your back. Keeping your elbows straight and your shoulder muscles relaxed, move the stick away from your body until you feel a stretch in your shoulders (extension). Avoid shrugging your shoulders while you move the stick. Keep your shoulder blades tucked down toward the middle of your back. Hold for __________ seconds. Slowly return to the starting position. Repeat __________ times. Complete this exercise __________ times a day. Range-of-motion exercises Pendulum  Stand near a wall or a surface that you can hold onto for balance. Bend at the waist and let your left / right arm hang straight down. Use your other arm to support you. Keep your back straight and do not lock your knees. Relax your left / right arm and shoulder muscles, and move your hips and your trunk so your left / right arm swings freely. Your arm should swing because of the motion of your body, not because you are using your arm or shoulder muscles. Keep moving your hips and trunk so your arm swings in the following directions, as told by your health care provider: Side to side. Forward and backward. In clockwise and counterclockwise circles. Continue each motion for __________ seconds, or for as long as told by your health care provider. Slowly return to the starting position. Repeat __________ times. Complete this exercise  __________ times a day. Shoulder flexion, standing  Stand and hold a broomstick, a cane, or a similar object. Place your hands a little more than shoulder-width apart on the object. Your left / right hand should be palm-up, and your other hand should be palm-down. Keep your elbow straight and your shoulder muscles relaxed. Push the stick up with your healthy arm to raise your left / right arm in front of your body, and then over your head until you feel a stretch in your shoulder (flexion). Avoid shrugging your shoulder while you raise your arm. Keep your shoulder blade tucked down toward the middle of your back. Hold for __________ seconds. Slowly return to the starting position. Repeat __________ times. Complete this exercise __________ times a day. Shoulder abduction, standing  Stand and hold a broomstick, a cane, or a similar object. Place your hands a little more than shoulder-width apart on the object. Your left / right hand should be palm-up, and your other hand should be palm-down. Keep your elbow straight and your shoulder muscles relaxed.  Push the object across your body toward your left / right side. Raise your left / right arm to the side of your body (abduction) until you feel a stretch in your shoulder. Do not raise your arm above shoulder height unless your health care provider tells you to do that. If directed, raise your arm over your head. Avoid shrugging your shoulder while you raise your arm. Keep your shoulder blade tucked down toward the middle of your back. Hold for __________ seconds. Slowly return to the starting position. Repeat __________ times. Complete this exercise __________ times a day. Internal rotation  Place your left / right hand behind your back, palm-up. Use your other hand to dangle an exercise band, a broomstick, or a similar object over your shoulder. Grasp the band with your left / right hand so you are holding on to both ends. Gently pull up on the  band until you feel a stretch in the front of your left / right shoulder. The movement of your arm toward the center of your body is called internal rotation. Avoid shrugging your shoulder while you raise your arm. Keep your shoulder blade tucked down toward the middle of your back. Hold for __________ seconds. Release the stretch by letting go of the band and lowering your hands. Repeat __________ times. Complete this exercise __________ times a day. Strengthening exercises External rotation  Sit in a stable chair without armrests. Secure an exercise band to a stable object at elbow height on your left / right side. Place a soft object, such as a folded towel or a small pillow, between your left / right upper arm and your body to move your elbow about 4 inches (10 cm) away from your side. Hold the end of the exercise band so it is tight and there is no slack. Keeping your elbow pressed against the soft object, slowly move your forearm out, away from your abdomen (external rotation). Keep your body steady so only your forearm moves. Hold for __________ seconds. Slowly return to the starting position. Repeat __________ times. Complete this exercise __________ times a day. Shoulder abduction  Sit in a stable chair without armrests, or stand up. Hold a __________ lb / kg weight in your left / right hand, or hold an exercise band with both hands. Start with your arms straight down and your left / right palm facing in, toward your body. Slowly lift your left / right hand out to your side (abduction). Do not lift your hand above shoulder height unless your health care provider tells you that this is safe. Keep your arms straight. Avoid shrugging your shoulder while you do this movement. Keep your shoulder blade tucked down toward the middle of your back. Hold for __________ seconds. Slowly lower your arm, and return to the starting position. Repeat __________ times. Complete this exercise  __________ times a day. Shoulder extension  Sit in a stable chair without armrests, or stand up. Secure an exercise band to a stable object in front of you so it is at shoulder height. Hold one end of the exercise band in each hand. Straighten your elbows and lift your hands up to shoulder height. Squeeze your shoulder blades together as you pull your hands down to the sides of your thighs (extension). Stop when your hands are straight down by your sides. Do not let your hands go behind your body. Hold for __________ seconds. Slowly return to the starting position. Repeat __________ times. Complete this exercise __________ times  a day. Shoulder row  Sit in a stable chair without armrests, or stand up. Secure an exercise band to a stable object in front of you so it is at chest height. Hold one end of the exercise band in each hand. Position your palms so that your thumbs are facing the ceiling (neutral position). Bend each of your elbows to a 90-degree angle (right angle) and keep your upper arms at your sides. Step back or move the chair back until the band is tight and there is no slack. Slowly pull your elbows back behind you. Hold for __________ seconds. Slowly return to the starting position. Repeat __________ times. Complete this exercise __________ times a day. Shoulder press-ups  Sit in a stable chair that has armrests. Sit upright, with your feet flat on the floor. Put your hands on the armrests so your elbows are bent and your fingers are pointing forward. Your hands should be about even with the sides of your body. Push down on the armrests and use your arms to lift yourself off the chair. Straighten your elbows and lift yourself up as much as you comfortably can. Move your shoulder blades down, and avoid letting your shoulders move up toward your ears. Keep your feet on the ground. As you get stronger, your feet should support less of your body weight as you lift yourself  up. Hold for __________ seconds. Slowly lower yourself back into the chair. Repeat __________ times. Complete this exercise __________ times a day. Wall push-ups  Stand so you are facing a stable wall. Your feet should be about one arm-length away from the wall. Lean forward and place your palms on the wall at shoulder height. Keep your feet flat on the floor as you bend your elbows and lean forward toward the wall. Hold for __________ seconds. Straighten your elbows to push yourself back to the starting position. Repeat __________ times. Complete this exercise __________ times a day. This information is not intended to replace advice given to you by your health care provider. Make sure you discuss any questions you have with your health care provider. Document Revised: 11/05/2021 Document Reviewed: 11/05/2021 Elsevier Patient Education  2024 Elsevier Inc.    If you have been instructed to have an in-person evaluation today at a local Urgent Care facility, please use the link below. It will take you to a list of all of our available [facility], including address, phone number and hours of operation. Please do not delay care.  [facility]  If you or a family member do not have a primary care provider, use the link below to schedule a visit and establish care. When you choose a [facility] primary care physician or advanced practice provider, you gain a long-term partner in health. Find a Primary Care Provider  Learn more about [facility]'s in-office and virtual care options: [facility] - Get Care Now

## 2023-08-19 NOTE — Progress Notes (Signed)
Virtual Visit Consent   Sonya Spencer, you are scheduled for a virtual visit with a [facility] provider today. Just as with appointments in the office, your consent must be obtained to participate. Your consent will be active for this visit and any virtual visit you may have with one of our providers in the next 365 days. If you have a MyChart account, a copy of this consent can be sent to you electronically.  As this is a virtual visit, video technology does not allow for your provider to perform a traditional examination. This may limit your provider's ability to fully assess your condition. If your provider identifies any concerns that need to be evaluated in person or the need to arrange testing (such as labs, EKG, etc.), we will make arrangements to do so. Although advances in technology are sophisticated, we cannot ensure that it will always work on either your end or our end. If the connection with a video visit is poor, the visit may have to be switched to a telephone visit. With either a video or telephone visit, we are not always able to ensure that we have a secure connection.  By engaging in this virtual visit, you consent to the provision of healthcare and authorize for your insurance to be billed (if applicable) for the services provided during this visit. Depending on your insurance coverage, you may receive a charge related to this service.  I need to obtain your verbal consent now. Are you willing to proceed with your visit today? Sonya Spencer has provided verbal consent on 08/19/2023 for a virtual visit (video or telephone). Margaretann Loveless, PA-C  Date: 08/19/2023 8:22 AM  Virtual Visit via Video Note   I, Margaretann Loveless, connected with  Sonya Spencer  ([id number], 04/24/1986) on 08/19/23 at  8:15 AM EST by a video-enabled telemedicine application and verified that I am speaking with the correct person using two identifiers.  Location: Patient: Virtual Visit  Location Patient: Home Provider: Virtual Visit Location Provider: Home Office   I discussed the limitations of evaluation and management by telemedicine and the availability of in person appointments. The patient expressed understanding and agreed to proceed.    History of Present Illness: Sonya Spencer is a 37 y.o. who identifies as a female who was assigned female at birth, and is being seen today for SLAP 2B tear of right shoulder, history of from an old worker's comp injury that was not repaired (over 2 years ago). On 08/13/23 was reaching behind her to buckle her child in and exacerbated the injury again. She was seen in the ER and given Diclofenac 50mg  BID and Flexeril 5mg  daily at bedtime. She reports no improvement in symptoms or relief with medications.     Problems:  Patient Active Problem List   Diagnosis Date Noted   Iron deficiency anemia 03/12/2023   Chronic pain of both shoulders 03/12/2023   GERD (gastroesophageal reflux disease) 04/02/2022   Mixed hyperlipidemia 04/02/2022   Class 1 obesity due to excess calories with body mass index (BMI) of 34.0 to 34.9 in adult 04/02/2022   Anxiety and depression 05/11/2017   Frequent headaches 05/11/2017    Allergies:  Allergies  Allergen Reactions   Oxycodone Other (See Comments)    migraines   Nickel Rash   Red Dye #40 (Allura Red) Rash    Includes Benadryl as it has red dye in it.   Medications:  Current Outpatient Medications:    methylPREDNISolone (  MEDROL DOSEPAK) 4 MG TBPK tablet, 6 day taper; take as directed on package instructions, Disp: 21 tablet, Rfl: 0   albuterol (VENTOLIN HFA) 108 (90 Base) MCG/ACT inhaler, TAKE TWO PUFFS BY MOUTH EVERY SIX HOURS AS NEEDED FOR WHEEZE, Disp: 8.5 g, Rfl: 0   buPROPion (WELLBUTRIN XL) 150 MG 24 hr tablet, Take 2 tablets (300 mg total) by mouth daily., Disp: 180 tablet, Rfl: 1   cetirizine (ZYRTEC) 10 MG tablet, Take 1 tablet (10 mg total) by mouth every evening., Disp: 90 tablet,  Rfl: 3   cyclobenzaprine (FLEXERIL) 5 MG tablet, Take 1 tablet (5 mg total) by mouth at bedtime., Disp: 15 tablet, Rfl: 0   diclofenac (VOLTAREN) 50 MG EC tablet, Take 1 tablet (50 mg total) by mouth 2 (two) times daily., Disp: 60 tablet, Rfl: 1   escitalopram (LEXAPRO) 20 MG tablet, Take 1 tablet (20 mg total) by mouth daily., Disp: 90 tablet, Rfl: 1   omeprazole (PRILOSEC) 20 MG capsule, Take 1 capsule (20 mg total) by mouth 2 (two) times daily before a meal., Disp: 180 capsule, Rfl: 1   ondansetron (ZOFRAN) 4 MG tablet, Take 1 tablet (4 mg total) by mouth every 8 (eight) hours as needed for nausea or vomiting., Disp: 20 tablet, Rfl: 0   tirzepatide (ZEPBOUND) 5 MG/0.5ML Pen, Inject 5 mg into the skin once a week., Disp: 2 mL, Rfl: 0  Observations/Objective: Patient is well-developed, well-nourished in no acute distress.  Resting comfortably at home.  Head is normocephalic, atraumatic.  No labored breathing.  Speech is clear and coherent with logical content.  Patient is alert and oriented at baseline.    Assessment and Plan: 1. 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 The patient was provided an opportunity to ask questions and all were answered. The  patient agreed with the plan and demonstrated an understanding of the instructions.  A copy of instructions were sent to the patient via MyChart unless otherwise noted below.    The patient was advised to call back or seek an in-person evaluation if the symptoms worsen or if the condition fails to improve as anticipated.    Margaretann Loveless, PA-C

## 2023-09-11 ENCOUNTER — Ambulatory Visit: Payer: Commercial Managed Care - PPO | Admitting: Internal Medicine

## 2023-09-14 ENCOUNTER — Emergency Department: Payer: Commercial Managed Care - PPO

## 2023-09-14 ENCOUNTER — Encounter: Payer: Self-pay | Admitting: Emergency Medicine

## 2023-09-14 ENCOUNTER — Emergency Department
Admission: EM | Admit: 2023-09-14 | Discharge: 2023-09-14 | Disposition: A | Discharge status: Home / Self Care | Payer: Commercial Managed Care - PPO | Attending: Emergency Medicine | Admitting: Emergency Medicine

## 2023-09-14 ENCOUNTER — Other Ambulatory Visit: Payer: Self-pay

## 2023-09-14 DIAGNOSIS — M542 Cervicalgia: Secondary | ICD-10-CM | POA: Insufficient documentation

## 2023-09-14 DIAGNOSIS — Y92410 Unspecified street and highway as the place of occurrence of the external cause: Secondary | ICD-10-CM | POA: Diagnosis not present

## 2023-09-14 DIAGNOSIS — M79671 Pain in right foot: Secondary | ICD-10-CM | POA: Insufficient documentation

## 2023-09-14 DIAGNOSIS — M25562 Pain in left knee: Secondary | ICD-10-CM | POA: Diagnosis not present

## 2023-09-14 DIAGNOSIS — M79672 Pain in left foot: Secondary | ICD-10-CM | POA: Diagnosis not present

## 2023-09-14 DIAGNOSIS — R079 Chest pain, unspecified: Secondary | ICD-10-CM | POA: Diagnosis present

## 2023-09-14 LAB — CBC WITH DIFFERENTIAL/PLATELET
Abs Immature Granulocytes: 0.04 10*3/uL (ref 0.00–0.07)
Basophils Absolute: 0.1 10*3/uL (ref 0.0–0.1)
Basophils Relative: 1 %
Eosinophils Absolute: 0.2 10*3/uL (ref 0.0–0.5)
Eosinophils Relative: 2 %
HCT: 35 % — ABNORMAL LOW (ref 36.0–46.0)
Hemoglobin: 11.1 g/dL — ABNORMAL LOW (ref 12.0–15.0)
Immature Granulocytes: 1 %
Lymphocytes Relative: 27 %
Lymphs Abs: 2.3 10*3/uL (ref 0.7–4.0)
MCH: 25.9 pg — ABNORMAL LOW (ref 26.0–34.0)
MCHC: 31.7 g/dL (ref 30.0–36.0)
MCV: 81.8 fL (ref 80.0–100.0)
Monocytes Absolute: 0.6 10*3/uL (ref 0.1–1.0)
Monocytes Relative: 7 %
Neutro Abs: 5.2 10*3/uL (ref 1.7–7.7)
Neutrophils Relative %: 62 %
Platelets: 423 10*3/uL — ABNORMAL HIGH (ref 150–400)
RBC: 4.28 MIL/uL (ref 3.87–5.11)
RDW: 13.7 % (ref 11.5–15.5)
WBC: 8.4 10*3/uL (ref 4.0–10.5)
nRBC: 0 % (ref 0.0–0.2)

## 2023-09-14 LAB — URINE DRUG SCREEN, QUALITATIVE (ARMC ONLY)
Amphetamines, Ur Screen: NOT DETECTED
Barbiturates, Ur Screen: NOT DETECTED
Benzodiazepine, Ur Scrn: NOT DETECTED
Cannabinoid 50 Ng, Ur ~~LOC~~: NOT DETECTED
Cocaine Metabolite,Ur ~~LOC~~: NOT DETECTED
MDMA (Ecstasy)Ur Screen: NOT DETECTED
Methadone Scn, Ur: NOT DETECTED
Opiate, Ur Screen: NOT DETECTED
Phencyclidine (PCP) Ur S: NOT DETECTED
Tricyclic, Ur Screen: NOT DETECTED

## 2023-09-14 LAB — ETHANOL: Alcohol, Ethyl (B): <10 mg/dL (ref <10)

## 2023-09-14 LAB — BASIC METABOLIC PANEL
Anion gap: 9 (ref 5–15)
BUN: 18 mg/dL (ref 6–20)
CO2: 27 mmol/L (ref 22–32)
Calcium: 9 mg/dL (ref 8.9–10.3)
Chloride: 102 mmol/L (ref 98–111)
Creatinine, Ser: 0.65 mg/dL (ref 0.44–1.00)
GFR, Estimated: >60 mL/min (ref >60)
Glucose, Bld: 91 mg/dL (ref 70–99)
Potassium: 4 mmol/L (ref 3.5–5.1)
Sodium: 138 mmol/L (ref 135–145)

## 2023-09-14 LAB — PREGNANCY, URINE: Preg Test, Ur: NEGATIVE

## 2023-09-14 MED ORDER — IOHEXOL 300 MG/ML  SOLN
75.0000 mL | Freq: Once | INTRAMUSCULAR | Status: AC | PRN
  Administered 2023-09-14: 75 mL via INTRAVENOUS

## 2023-09-14 MED ORDER — TIZANIDINE HCL 4 MG PO TABS: 4.0000 mg | ORAL_TABLET | Freq: Three times a day (TID) | ORAL | 1 refills | Status: DC | PRN

## 2023-09-14 MED ORDER — IBUPROFEN 800 MG PO TABS: 800.0000 mg | ORAL_TABLET | Freq: Three times a day (TID) | ORAL | 0 refills | Status: DC | PRN
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
## 2023-09-29 ENCOUNTER — Encounter: Payer: Self-pay | Admitting: Internal Medicine

## 2023-09-29 ENCOUNTER — Ambulatory Visit (INDEPENDENT_AMBULATORY_CARE_PROVIDER_SITE_OTHER): Payer: Commercial Managed Care - PPO | Admitting: Internal Medicine

## 2023-09-29 VITALS — BP 118/74 | Ht 63.0 in | Wt 178.6 lb

## 2023-09-29 DIAGNOSIS — R739 Hyperglycemia, unspecified: Secondary | ICD-10-CM | POA: Diagnosis not present

## 2023-09-29 DIAGNOSIS — E66811 Obesity, class 1: Secondary | ICD-10-CM

## 2023-09-29 DIAGNOSIS — D500 Iron deficiency anemia secondary to blood loss (chronic): Secondary | ICD-10-CM

## 2023-09-29 DIAGNOSIS — M25512 Pain in left shoulder: Secondary | ICD-10-CM

## 2023-09-29 DIAGNOSIS — G8929 Other chronic pain: Secondary | ICD-10-CM

## 2023-09-29 DIAGNOSIS — K219 Gastro-esophageal reflux disease without esophagitis: Secondary | ICD-10-CM

## 2023-09-29 DIAGNOSIS — M25511 Pain in right shoulder: Secondary | ICD-10-CM

## 2023-09-29 DIAGNOSIS — D75839 Thrombocytosis, unspecified: Secondary | ICD-10-CM | POA: Insufficient documentation

## 2023-09-29 DIAGNOSIS — E782 Mixed hyperlipidemia: Secondary | ICD-10-CM | POA: Diagnosis not present

## 2023-09-29 DIAGNOSIS — R519 Headache, unspecified: Secondary | ICD-10-CM

## 2023-09-29 DIAGNOSIS — F419 Anxiety disorder, unspecified: Secondary | ICD-10-CM

## 2023-09-29 DIAGNOSIS — Z6831 Body mass index (BMI) 31.0-31.9, adult: Secondary | ICD-10-CM

## 2023-09-29 DIAGNOSIS — F32A Depression, unspecified: Secondary | ICD-10-CM

## 2023-09-29 DIAGNOSIS — E6609 Other obesity due to excess calories: Secondary | ICD-10-CM

## 2023-09-29 MED ORDER — TIRZEPATIDE-WEIGHT MANAGEMENT 2.5 MG/0.5ML ~~LOC~~ SOAJ: 2.5000 mg | SUBCUTANEOUS | 0 refills | Status: DC

## 2023-09-29 NOTE — Assessment & Plan Note (Signed)
Lipid profile today Encouraged to consume low-fat diet

## 2023-09-29 NOTE — Patient Instructions (Signed)
Iron Deficiency Anemia, Adult  Iron deficiency anemia is a condition in which the concentration of red blood cells or hemoglobin in the blood is below normal because of too little iron. Hemoglobin is a substance in red blood cells that carries oxygen to the body's tissues. When the concentration of red blood cells or hemoglobin is too low, not enough oxygen reaches these tissues. Iron deficiency anemia is usually long-lasting, and it develops over time. It may or may not cause symptoms. It is a common type of anemia. What are the causes? This condition may be caused by: Not enough iron in the diet. Abnormal absorption in the gut. Blood loss. What increases the risk? You are more likely to develop this condition if you get menstrual periods (menstruate) or are pregnant. What are the signs or symptoms? Symptoms of this condition may include: Pale skin, lips, and nail beds. Weakness, dizziness, and getting tired easily. Shortness of breath when moving or exercising. Cold hands or feet. Mild anemia may not cause any symptoms. How is this diagnosed? This condition is diagnosed based on: Your medical history. A physical exam. Blood tests. How is this treated? This condition is treated by correcting the cause of your iron deficiency. Treatment may involve: Adding iron-rich foods to your diet. Taking iron supplements. If you are pregnant or breastfeeding, you may need to take extra iron because your normal diet usually does not provide the amount of iron that you need. Increasing vitamin C intake. Vitamin C helps your body absorb iron. Your health care provider may recommend that you take iron supplements along with a glass of orange juice or a vitamin C supplement. Medicines to make heavy menstrual flow lighter. Surgery or additional testing procedures to determine the cause of your anemia. You may need repeat blood tests to determine whether treatment is working. If the treatment does not  seem to be working, you may need more tests. Follow these instructions at home: Medicines Take over-the-counter and prescription medicines only as told by your health care provider. This includes iron supplements and vitamins. This is important because too much iron can be harmful. For the best iron absorption, you should take iron supplements when your stomach is empty. If you cannot tolerate them on an empty stomach, you may need to take them with food. Do not drink milk or take antacids at the same time as your iron supplements. Milk and antacids may interfere with how your body absorbs iron. Iron supplements may turn stool (feces) a darker color and it may appear black. If you cannot tolerate taking iron supplements by mouth, talk with your health care provider about taking them through an IV or through an injection into a muscle. Eating and drinking Talk with your health care provider before changing your diet. Your provider may recommend that you eat foods that contain a lot of iron, such as: Liver. Low-fat (lean) beef. Breads and cereals that have iron added to them (are fortified). Eggs. Dried fruit. Dark green, leafy vegetables. To help your body use the iron from iron-rich foods, eat those foods at the same time as fresh fruits and vegetables that are high in vitamin C. Foods that are high in vitamin C include: Oranges. Peppers. Tomatoes. Mangoes. Managing constipation If you are taking an iron supplement, it may cause constipation. To prevent or treat constipation, you may need to: Drink enough fluid to keep your urine pale yellow. Take over-the-counter or prescription medicines. Eat foods that are high in fiber, such   as beans, whole grains, and fresh fruits and vegetables. Limit foods that are high in fat and processed sugars, such as fried or sweet foods. General instructions Return to your normal activities as told by your health care provider. Ask your health care provider  what activities are safe for you. Keep all follow-up visits. Contact a health care provider if: You feel nauseous or you vomit. You feel weak. You become light-headed when getting up from a sitting or lying down position. You have unexplained sweating. You develop symptoms of constipation. You have a heaviness in your chest. You have trouble breathing with physical activity. Get help right away if: You faint. If this happens, do not drive yourself to the hospital. You have an irregular or rapid heartbeat. Summary Iron deficiency anemia is a condition in which the concentration of red blood cells or hemoglobin in the blood is below normal because of too little iron. This condition is treated by correcting the cause of your iron deficiency. Take over-the-counter and prescription medicines only as told by your health care provider. This includes iron supplements and vitamins. To help your body use the iron from iron-rich foods, eat those foods at the same time as fresh fruits and vegetables that are high in vitamin C. Seek medical help if you have signs or symptoms of worsening anemia. This information is not intended to replace advice given to you by your health care provider. Make sure you discuss any questions you have with your health care provider. Document Revised: 10/23/2021 Document Reviewed: 10/23/2021 Elsevier Patient Education  2024 Elsevier Inc.  

## 2023-09-29 NOTE — Assessment & Plan Note (Signed)
Continue escitalopram and bupropion Support offered

## 2023-09-29 NOTE — Progress Notes (Signed)
 History of Present Illness:  Patient would like to follow-up chronic conditions.  GERD: Rarely occurs.  She takes omeprazole  as needed.  There is no upper GI on file.  Frequent headaches: These occur rarely but she reports these have been worse in the last 2-3 week due to [facility] with concussion.  Triggered by stress, lack of caffeine  or sleep.  She takes ibuprofen  and tylenol  as needed with some relief of symptoms.  She does not follow with neurology.  Anxiety and depression: Chronic, managed on escitalopram  and bupropion . She is currently seeing a therapist.  She denies SI/HI.  HLD: Her last LDL was 155, triglycerides 85, 02/2023.  She is not taking any cholesterol-lowering medication at this time.  She does not consume low-fat diet.   Anemia: Her last H/H was 11.1/35, 08/2023.  She is not taking any oral iron at this time.  She does not follow with hematology.  Thrombocytosis: Her last platelet count was 423, 08/2023.  She is not following with hematology.  Chronic right shoulder pain: s/p a work injury 2 years ago, but she feels like the pain has worsened in the last 6 months. Managed with ibuprofen  and cyclobenzaprine . Xray from 07/2023 reviewed. She has seen [facility] [place] in the past. She would like referral to orthopedics for reevaluation.  Past Medical History:  Diagnosis Date   Abnormal glucose tolerance test (GTT) during pregnancy, antepartum 12/23/2017   - pt check BG log.   - Lifestyles referral for presumptive GDM (also due to inordinate weight loss early in pregnancy) - will treat as GDM.  Patient has refused 3 hour gtt.     Allergy    Anemia    with pregnancy only   Anxiety    Asthma    only with pregnancy   Cholelithiasis    Complication of anesthesia    panic attack with first panic   Depression    Fetal macrosomia during pregnancy in third trimester 03/25/2015   Resolved with delivery    Frequent headaches    migraines with pregnancy   GERD (gastroesophageal  reflux disease)    Gestational diabetes    Gestational diabetes mellitus (GDM) affecting third pregnancy 02/05/2018   H/O maternal third degree perineal laceration, currently pregnant    History of Papanicolaou smear of cervix 10/10/11; 08/02/14   neg, ct neg; neg ct//gc/tr neg   PONV (postoperative nausea and vomiting)    Supervision of normal pregnancy 11/09/2017   [facility] Prenatal Labs Dating  Blood type:    Genetic Screen 1 Screen:    AFP:     Quad:     NIPS: Antibody:  Anatomic US   Rubella:   Varicella:   GTT Early:               Third trimester:  RPR:    Rhogam  HBsAg:    TDaP vaccine                       Flu Shot: HIV:    Baby Food                                GBS:  Contraception  Pap: CBB    CS/VBAC   Support Person         Urinary incontinence     Current Outpatient Medications  Medication Sig Dispense Refill   albuterol  (VENTOLIN  HFA) 108 (90 Base) MCG/ACT  inhaler TAKE TWO PUFFS BY MOUTH EVERY SIX HOURS AS NEEDED FOR WHEEZE 8.5 g 0   buPROPion  (WELLBUTRIN  XL) 150 MG 24 hr tablet Take 2 tablets (300 mg total) by mouth daily. 180 tablet 1   cetirizine  (ZYRTEC ) 10 MG tablet Take 1 tablet (10 mg total) by mouth every evening. 90 tablet 3   cyclobenzaprine  (FLEXERIL ) 5 MG tablet Take 1 tablet (5 mg total) by mouth at bedtime. 15 tablet 0   diclofenac  (VOLTAREN ) 50 MG EC tablet Take 1 tablet (50 mg total) by mouth 2 (two) times daily. 60 tablet 1   escitalopram  (LEXAPRO ) 20 MG tablet Take 1 tablet (20 mg total) by mouth daily. 90 tablet 1   ibuprofen  (ADVIL ) 800 MG tablet Take 1 tablet (800 mg total) by mouth every 8 (eight) hours as needed (pain). 30 tablet 0   methylPREDNISolone  (MEDROL  DOSEPAK) 4 MG TBPK tablet 6 day taper; take as directed on package instructions 21 tablet 0   omeprazole  (PRILOSEC) 20 MG capsule Take 1 capsule (20 mg total) by mouth 2 (two) times daily before a meal. 180 capsule 1   ondansetron  (ZOFRAN ) 4 MG tablet Take 1 tablet (4 mg total) by mouth every 8  (eight) hours as needed for nausea or vomiting. 20 tablet 0   tirzepatide  (ZEPBOUND ) 5 MG/0.5ML Pen Inject 5 mg into the skin once a week. 2 mL 0   tiZANidine  (ZANAFLEX ) 4 MG tablet Take 1 tablet (4 mg total) by mouth every 8 (eight) hours as needed for muscle spasms. 15 tablet 1   No current facility-administered medications for this visit.    Allergies  Allergen Reactions   Oxycodone  Other (See Comments)    migraines   Nickel Rash   Red Dye #40 (Allura Red) Rash    Includes Benadryl  as it has red dye in it.    Family History  Problem Relation Age of Onset   Lupus Mother    Hepatitis C Mother    Depression Mother    Diabetes type I Father    Thyroid disease Father    Lupus Sister    Heart attack Sister    CVA Sister    Lupus Maternal Grandmother    Diabetes Maternal Grandmother    Diabetes Paternal Grandfather        type 1   Diabetes type I Daughter    Diabetes Daughter    Lupus Maternal Aunt    Diabetes Paternal Aunt        type 1 - runs in father's side    Social History   Socioeconomic History   Marital status: Legally Separated    Spouse name: Not on file   Number of children: 3   Years of education: 14   Highest education level: 12th grade  Occupational History   Occupation: HOMEMAKER  Tobacco Use   Smoking status: Former    Current packs/day: 0.00    Average packs/day: 0.3 packs/day for 2.0 years (0.5 ttl pk-yrs)    Types: Cigarettes    Start date: 03/14/2007    Quit date: 03/13/2009    Years since quitting: 14.5   Smokeless tobacco: Never  Vaping Use   Vaping status: Some Days   Substances: CBD  Substance and Sexual Activity   Alcohol use: Not Currently    Alcohol/week: 3.0 standard drinks of alcohol    Types: 3 Shots of liquor per week   Drug use: No    Comment: THC pens   Sexual activity: Yes  Birth control/protection: Surgical    Comment: Tubal Ligation  Other Topics Concern   Not on file  Social History Narrative   Not on file    Social Drivers of Health   Financial Resource Strain: High Risk (09/25/2023)   Overall Financial Resource Strain (CARDIA)    Difficulty of Paying Living Expenses: Very hard  Food Insecurity: Food Insecurity Present (09/25/2023)   Hunger Vital Sign    Worried About Running Out of Food in the Last Year: Sometimes true    Ran Out of Food in the Last Year: Sometimes true  Transportation Needs: No Transportation Needs (09/25/2023)   PRAPARE - Administrator, Civil Service (Medical): No    Lack of Transportation (Non-Medical): No  Physical Activity: Sufficiently Active (09/25/2023)   Exercise Vital Sign    Days of Exercise per Week: 7 days    Minutes of Exercise per Session: 90 min  Stress: Stress Concern Present (09/25/2023)   [employer] of Occupational Health - Occupational Stress Questionnaire    Feeling of Stress : Very much  Social Connections: Socially Isolated (09/25/2023)   Social Connection and Isolation Panel [NHANES]    Frequency of Communication with Friends and Family: More than three times a week    Frequency of Social Gatherings with Friends and Family: More than three times a week    Attends Religious Services: Never    Database Administrator or Organizations: No    Attends Engineer, Structural: Not on file    Marital Status: Separated  Intimate Partner Violence: Not on file     Constitutional: Patient reports intermittent headaches.  Denies fever, malaise, fatigue, or abrupt weight changes.  HEENT: Denies eye pain, eye redness, ear pain, ringing in the ears, wax buildup, runny nose, nasal congestion, bloody nose, or sore throat. Respiratory: Denies difficulty breathing, shortness of breath, cough or sputum production.   Cardiovascular: Denies chest pain, chest tightness, palpitations or swelling in the hands or feet.  Gastrointestinal: Patient reports intermittent reflux.  Denies abdominal pain, bloating, constipation, diarrhea or blood in  the stool.  GU: Denies urgency, frequency, pain with urination, burning sensation, blood in urine, odor or discharge. Musculoskeletal: Patient reports chronic right shoulder pain.  Denies difficulty with gait, muscle pain or joint swelling.  Skin: Denies redness, rashes, lesions or ulcercations.  Neurological: Denies dizziness, difficulty with memory, difficulty with speech or problems with balance and coordination.  Psych: Patient has a history of anxiety and depression.  Denies SI/HI.  No other specific complaints in a complete review of systems (except as listed in HPI above).  Observations/Objective:  BP 118/74 (BP Location: Left Arm, Patient Position: Sitting, Cuff Size: Normal)   Ht 5' 3 (1.6 m)   Wt 178 lb 9.6 oz (81 kg)   LMP 09/14/2023   BMI 31.64 kg/m   Wt Readings from Last 3 Encounters:  09/14/23 170 lb (77.1 kg)  08/13/23 168 lb (76.2 kg)  06/16/23 152 lb (68.9 kg)    General: Appears her stated age, obese, in NAD. Cardio: Normal rate and rhythm.  S1 and S2 noted.  No murmurs, rubs or gallops noted.  No JVD or BLE edema. Pulmonary/Chest: Normal effort. No respiratory distress.  Abdomen: Normal bowel sounds. MSK: Normal internal and external rotation of the right shoulder but painful.  Pain with palpation over the right AC joint, right subacromial bursa and anterior biceps tendon.  Positive drop can test on the right.  Strength 4/5 RUE, 5/5 LUE.  Neurological: Alert and oriented.  Coordination normal. Psychiatric: Mood and affect normal. Behavior is normal. Judgment and thought content normal.   BMET    Component Value Date/Time   NA 138 09/14/2023 0832   NA 138 10/08/2014 1732   K 4.0 09/14/2023 0832   K 3.6 10/08/2014 1732   CL 102 09/14/2023 0832   CL 106 10/08/2014 1732   CO2 27 09/14/2023 0832   CO2 23 10/08/2014 1732   GLUCOSE 91 09/14/2023 0832   GLUCOSE 70 10/08/2014 1732   BUN 18 09/14/2023 0832   BUN 5 (L) 10/08/2014 1732   CREATININE 0.65  09/14/2023 0832   CREATININE 0.52 06/16/2023 0955   CALCIUM  9.0 09/14/2023 0832   CALCIUM  8.6 10/08/2014 1732   GFRNONAA >60 09/14/2023 0832   GFRNONAA 115 10/03/2019 1110   GFRAA >60 05/09/2020 0128   GFRAA 134 10/03/2019 1110    Lipid Panel     Component Value Date/Time   CHOL 236 (H) 03/12/2023 0846   TRIG 85 03/12/2023 0846   HDL 62 03/12/2023 0846   CHOLHDL 3.8 03/12/2023 0846   LDLCALC 155 (H) 03/12/2023 0846    CBC    Component Value Date/Time   WBC 8.4 09/14/2023 0832   RBC 4.28 09/14/2023 0832   HGB 11.1 (L) 09/14/2023 0832   HGB 11.2 04/22/2018 1054   HCT 35.0 (L) 09/14/2023 0832   HCT 33.3 (L) 04/22/2018 1054   PLT 423 (H) 09/14/2023 0832   PLT 250 04/22/2018 1054   MCV 81.8 09/14/2023 0832   MCV 88 04/22/2018 1054   MCV 88 10/08/2014 1732   MCH 25.9 (L) 09/14/2023 0832   MCHC 31.7 09/14/2023 0832   RDW 13.7 09/14/2023 0832   RDW 14.3 04/22/2018 1054   RDW 14.0 10/08/2014 1732   LYMPHSABS 2.3 09/14/2023 0832   LYMPHSABS 3.3 10/08/2014 1732   MONOABS 0.6 09/14/2023 0832   MONOABS 0.7 10/08/2014 1732   EOSABS 0.2 09/14/2023 0832   EOSABS 0.1 10/08/2014 1732   BASOSABS 0.1 09/14/2023 0832   BASOSABS 0.0 10/08/2014 1732    Hgb A1C Lab Results  Component Value Date   HGBA1C 5.5 03/12/2023       Assessment and Plan:  RTC in 6 months for your annual exam    Angeline Laura, NP

## 2023-09-29 NOTE — Assessment & Plan Note (Signed)
She will check insurance cover regarding Wegovy and Zepbound Encourage high-protein, low-carb diet and exercise for weight loss

## 2023-09-29 NOTE — Assessment & Plan Note (Signed)
CBC reviewed We will monitor

## 2023-09-29 NOTE — Assessment & Plan Note (Signed)
Continue ibuprofen and Tylenol OTC as needed

## 2023-09-29 NOTE — Assessment & Plan Note (Signed)
 Try to identify and avoid foods that trigger reflux Encourage weight loss as this can help reduce reflux symptoms Continue omeprazole OTC as needed

## 2023-09-29 NOTE — Assessment & Plan Note (Signed)
Continue ibuprofen and cyclobenzaprine as needed Referral to orthopedics for reevaluation

## 2023-09-30 LAB — LIPID PANEL
Cholesterol: 208 mg/dL — ABNORMAL HIGH (ref <200)
HDL: 63 mg/dL (ref >=50)
LDL Cholesterol (Calc): 125 mg/dL — ABNORMAL HIGH
Non-HDL Cholesterol (Calc): 145 mg/dL — ABNORMAL HIGH (ref <130)
Total CHOL/HDL Ratio: 3.3 (calc) (ref <5.0)
Triglycerides: 96 mg/dL (ref <150)

## 2023-09-30 LAB — HEMOGLOBIN A1C
Hgb A1c MFr Bld: 5.4 %{Hb} (ref <5.7)
Mean Plasma Glucose: 108 mg/dL
eAG (mmol/L): 6 mmol/L

## 2023-10-05 NOTE — Addendum Note (Signed)
 Addended by: Lorre Munroe on: 10/05/2023 08:38 AM   Modules accepted: Orders

## 2023-10-21 ENCOUNTER — Other Ambulatory Visit: Payer: Self-pay | Admitting: Orthopedic Surgery

## 2023-10-21 DIAGNOSIS — S43431A Superior glenoid labrum lesion of right shoulder, initial encounter: Secondary | ICD-10-CM

## 2023-10-21 DIAGNOSIS — M25311 Other instability, right shoulder: Secondary | ICD-10-CM

## 2023-10-21 DIAGNOSIS — M25819 Other specified joint disorders, unspecified shoulder: Secondary | ICD-10-CM

## 2023-10-26 ENCOUNTER — Other Ambulatory Visit: Payer: Self-pay | Admitting: Internal Medicine

## 2023-10-28 ENCOUNTER — Encounter: Payer: Self-pay | Admitting: Internal Medicine

## 2023-10-28 NOTE — Telephone Encounter (Signed)
Requested medication (s) are due for refill today:   Provider to review  Requested medication (s) are on the active medication list:   Yes  Future visit scheduled:   Yes 03/15/2024   Last ordered: 09/29/2023 2 ml, 0 refills  No protocol assigned to this medication   Requested Prescriptions  Pending Prescriptions Disp Refills   ZEPBOUND 2.5 MG/0.5ML Pen [Pharmacy Med Name: ZEPBOUND 2.5MG  SOLN AUTO-INJ] 2 mL 0    Sig: INJECT TWO AND A HALF (2 & 1/2) MG INTO THE SKIN ONCE A WEEK.     Off-Protocol Failed - 10/28/2023  1:55 PM      Failed - Medication not assigned to a protocol, review manually.      Passed - Valid encounter within last 12 months    Recent Outpatient Visits           4 weeks ago Mixed hyperlipidemia   [facility] [place], Salvadore Oxford, NP   4 months ago Upper abdominal pain   [place] [facility] [place], Salvadore Oxford, NP   7 months ago Encounter for general adult medical examination with abnormal findings   [facility] [place], Salvadore Oxford, NP   9 months ago Gastroesophageal reflux disease without esophagitis   [place] [place], Salvadore Oxford, NP   1 year ago Anxiety and depression   [facility] [place], Salvadore Oxford, NP       Future Appointments             In 4 months Baity, Salvadore Oxford, NP [facility] [facility], [facility]

## 2023-10-29 ENCOUNTER — Ambulatory Visit: Payer: Self-pay | Admitting: *Deleted

## 2023-10-29 ENCOUNTER — Encounter: Payer: Self-pay | Admitting: Orthopedic Surgery

## 2023-10-29 MED ORDER — ZEPBOUND 5 MG/0.5ML ~~LOC~~ SOAJ: 5.0000 mg | SUBCUTANEOUS | 0 refills | Status: DC

## 2023-10-29 NOTE — Telephone Encounter (Signed)
Zepbound correct order resent to pharmacy.

## 2023-10-29 NOTE — Telephone Encounter (Signed)
Summary: Rx clarification   [facility] calling in requesting prescription for zepbound pens to be sent in, prescription for tirzepatide 5 MG/0.5ML injection via, was sent in as vial and pharmacy unable to fill as vial.  Callback  #: [phone]         Reason for Disposition . [1] Pharmacy calling with prescription question AND [2] triager unable to answer question  Answer Assessment - Initial Assessment Questions 1. NAME of MEDICINE: "What medicine(s) are you calling about?"     tirzepatide 5 MG/0.5ML injection vial 2. QUESTION: "What is your question?" (e.g., double dose of medicine, side effect)     Pharmacy called - request change to pen 3. PRESCRIBER: "Who prescribed the medicine?" Reason: if prescribed by specialist, call should be referred to that group.     PCP  Protocols used: Medication Question Call-A-AH

## 2023-10-29 NOTE — Telephone Encounter (Signed)
  Chief Complaint: Pharmacy call: [facility]  Disposition: [] ED /[] Urgent Care (no appt availability in office) / [] Appointment(In office/virtual)/ []  [facility] [facility]/ [] Home Care/ [] Refused Recommended Disposition /[] [place] [facility]/ [x]  Follow-up with PCP Additional Notes: Request change:  tirzepatide 5 MG/0.5ML injection vial to pen

## 2023-10-29 NOTE — Addendum Note (Signed)
Addended by: Lorre Munroe on: 10/29/2023 10:32 AM   Modules accepted: Orders

## 2023-10-30 MED ORDER — TIRZEPATIDE 2.5 MG/0.5ML ~~LOC~~ SOAJ: 2.5000 mg | SUBCUTANEOUS | 0 refills | Status: DC

## 2023-11-04 ENCOUNTER — Other Ambulatory Visit: Payer: Commercial Managed Care - PPO

## 2023-11-04 ENCOUNTER — Inpatient Hospital Stay: Admission: RE | Admit: 2023-11-04 | Payer: Commercial Managed Care - PPO | Source: Ambulatory Visit

## 2023-11-04 ENCOUNTER — Ambulatory Visit
Admission: RE | Admit: 2023-11-04 | Discharge: 2023-11-04 | Disposition: A | Discharge status: Home / Self Care | Payer: Commercial Managed Care - PPO | Source: Ambulatory Visit | Attending: Orthopedic Surgery | Admitting: Orthopedic Surgery

## 2023-11-04 DIAGNOSIS — S43431A Superior glenoid labrum lesion of right shoulder, initial encounter: Secondary | ICD-10-CM

## 2023-11-04 DIAGNOSIS — M25311 Other instability, right shoulder: Secondary | ICD-10-CM

## 2023-11-04 DIAGNOSIS — M25819 Other specified joint disorders, unspecified shoulder: Secondary | ICD-10-CM

## 2023-11-04 MED ORDER — IOPAMIDOL (ISOVUE-M 200) INJECTION 41%: 13.0000 mL | Freq: Once | INTRAMUSCULAR | Status: DC

## 2023-11-20 NOTE — Progress Notes (Addendum)
 PA started   Sonya Spencer (Key: [id number]) Need Help? Call us at [phone] Status New (Not sent to plan) Drug Zepbound 2.5MG /0.5ML pen-injectors ePA cloud Psychologist, educational Electronic PA Form (2017 NCPDP)  Approved today by [employer] NCPDP 2017 Your PA request has been approved. Additional information will be provided in the approval communication. (Message 1145) Effective Date: 11/19/2023 Authorization Expiration Date: 07/18/2024

## 2023-11-30 ENCOUNTER — Other Ambulatory Visit: Payer: Self-pay | Admitting: Internal Medicine

## 2023-12-01 NOTE — Telephone Encounter (Signed)
 Requested Prescriptions  Pending Prescriptions Disp Refills   escitalopram (LEXAPRO) 20 MG tablet [Pharmacy Med Name: ESCITALOPRAM OXALATE 20MG  TABLET] 90 tablet 1    Sig: TAKE ONE TABLET (20 MG TOTAL) BY MOUTH DAILY.     Psychiatry:  Antidepressants - SSRI Passed - 12/01/2023  5:35 PM      Passed - Completed PHQ-2 or PHQ-9 in the last 360 days      Passed - Valid encounter within last 6 months    Recent Outpatient Visits           2 months ago Mixed hyperlipidemia   [place] [facility] [place], Salvadore Oxford, NP   5 months ago Upper abdominal pain   [facility] [facility] [place], Salvadore Oxford, NP   8 months ago Encounter for general adult medical examination with abnormal findings   [place] [facility] [place], Salvadore Oxford, NP   10 months ago Gastroesophageal reflux disease without esophagitis   [place] [facility] [place], Salvadore Oxford, NP   1 year ago Anxiety and depression   [place] [facility] [place], Salvadore Oxford, NP       Future Appointments             In 3 months Baity, Salvadore Oxford, NP [facility], [facility]

## 2023-12-19 ENCOUNTER — Telehealth: Admitting: Nurse Practitioner

## 2023-12-19 DIAGNOSIS — L03116 Cellulitis of left lower limb: Secondary | ICD-10-CM | POA: Diagnosis not present

## 2023-12-19 MED ORDER — SULFAMETHOXAZOLE-TRIMETHOPRIM 800-160 MG PO TABS: 1.0000 | ORAL_TABLET | Freq: Two times a day (BID) | ORAL | 0 refills | Status: AC

## 2023-12-19 NOTE — Patient Instructions (Signed)
  Sonya Spencer, thank you for joining Claiborne Rigg, NP for today's virtual visit.  While this provider is not your primary care provider (PCP), if your PCP is located in our provider database this encounter information will be shared with them immediately following your visit.   A [facility] MyChart account gives you access to today's visit and all your visits, tests, and labs performed at [facility] " click here if you don't have a [facility] MyChart account or go to [email or web address]  Consent: (Patient) Sonya Spencer provided verbal consent for this virtual visit at the beginning of the encounter.  Current Medications:  Current Outpatient Medications:    sulfamethoxazole-trimethoprim (BACTRIM DS) 800-160 MG tablet, Take 1 tablet by mouth 2 (two) times daily for 7 days., Disp: 14 tablet, Rfl: 0   albuterol (VENTOLIN HFA) 108 (90 Base) MCG/ACT inhaler, TAKE TWO PUFFS BY MOUTH EVERY SIX HOURS AS NEEDED FOR WHEEZE, Disp: 8.5 g, Rfl: 0   buPROPion (WELLBUTRIN XL) 150 MG 24 hr tablet, Take 2 tablets (300 mg total) by mouth daily., Disp: 180 tablet, Rfl: 1   cetirizine (ZYRTEC) 10 MG tablet, Take 1 tablet (10 mg total) by mouth every evening., Disp: 90 tablet, Rfl: 3   cyclobenzaprine (FLEXERIL) 5 MG tablet, Take 1 tablet (5 mg total) by mouth at bedtime., Disp: 15 tablet, Rfl: 0   escitalopram (LEXAPRO) 20 MG tablet, TAKE ONE TABLET (20 MG TOTAL) BY MOUTH DAILY., Disp: 90 tablet, Rfl: 0   ibuprofen (ADVIL) 800 MG tablet, Take 1 tablet (800 mg total) by mouth every 8 (eight) hours as needed (pain)., Disp: 30 tablet, Rfl: 0   omeprazole (PRILOSEC) 20 MG capsule, Take 1 capsule (20 mg total) by mouth 2 (two) times daily before a meal., Disp: 180 capsule, Rfl: 1   ondansetron (ZOFRAN) 4 MG tablet, Take 1 tablet (4 mg total) by mouth every 8 (eight) hours as needed for nausea or vomiting., Disp: 20 tablet, Rfl: 0   tirzepatide (MOUNJARO) 2.5 MG/0.5ML Pen, Inject 2.5 mg into  the skin once a week., Disp: 6 mL, Rfl: 0   Medications ordered in this encounter:  Meds ordered this encounter  Medications   sulfamethoxazole-trimethoprim (BACTRIM DS) 800-160 MG tablet    Sig: Take 1 tablet by mouth 2 (two) times daily for 7 days.    Dispense:  14 tablet    Refill:  0    Supervising Provider:   Merrilee Jansky [[id number]]     *If you need refills on other medications prior to your next appointment, please contact your pharmacy*  Follow-Up: Call back or seek an in-person evaluation if the symptoms worsen or if the condition fails to improve as anticipated.  [facility] [phone]  If you have been instructed to have an in-person evaluation today at a local Urgent Care facility, please use the link below. It will take you to a list of all of our available [facility], including address, phone number and hours of operation. Please do not delay care.  [facility]  If you or a family member do not have a primary care provider, use the link below to schedule a visit and establish care. When you choose a [facility] primary care physician or advanced practice provider, you gain a long-term partner in health. Find a Primary Care Provider  Learn more about [facility] in-office and virtual care options:  - Get Care Now

## 2023-12-19 NOTE — Progress Notes (Signed)
 Virtual Visit Consent   Sonya Spencer, you are scheduled for a virtual visit with a [facility] provider today. Just as with appointments in the office, your consent must be obtained to participate. Your consent will be active for this visit and any virtual visit you may have with one of our providers in the next 365 days. If you have a MyChart account, a copy of this consent can be sent to you electronically.  As this is a virtual visit, video technology does not allow for your provider to perform a traditional examination. This may limit your provider's ability to fully assess your condition. If your provider identifies any concerns that need to be evaluated in person or the need to arrange testing (such as labs, EKG, etc.), we will make arrangements to do so. Although advances in technology are sophisticated, we cannot ensure that it will always work on either your end or our end. If the connection with a video visit is poor, the visit may have to be switched to a telephone visit. With either a video or telephone visit, we are not always able to ensure that we have a secure connection.  By engaging in this virtual visit, you consent to the provision of healthcare and authorize for your insurance to be billed (if applicable) for the services provided during this visit. Depending on your insurance coverage, you may receive a charge related to this service.  I need to obtain your verbal consent now. Are you willing to proceed with your visit today? Sonya Spencer has provided verbal consent on 12/19/2023 for a virtual visit (video or telephone). Claiborne Rigg, NP  Date: 12/19/2023 7:51 PM   Virtual Visit via Video Note   I, Claiborne Rigg, connected with  Sonya Spencer  ([id number], [birth year]) on 12/19/23 at  7:45 PM EDT by a video-enabled telemedicine application and verified that I am speaking with the correct person using two identifiers.  Location: Patient: Virtual Visit Location  Patient: Home Provider: Virtual Visit Location Provider: Home Office   I discussed the limitations of evaluation and management by telemedicine and the availability of in person appointments. The patient expressed understanding and agreed to proceed.    History of Present Illness: Sonya Spencer is a 38 y.o. who identifies as a female who was assigned female at birth, and is being seen today for cellulitis.  Ms. Salah has a boil on the back of her left leg. She is not sure if she was bitten or if this is a localized skin infection from abscess or boil. The area is red and has increased in size since last night. There is pain associated with the area when she sits and she had to leave work tonight due to the pain.   Problems:  Patient Active Problem List   Diagnosis Date Noted   Thrombocytosis 09/29/2023   Iron deficiency anemia 03/12/2023   Chronic pain of both shoulders 03/12/2023   GERD (gastroesophageal reflux disease) 04/02/2022   Mixed hyperlipidemia 04/02/2022   Class 1 obesity due to excess calories with body mass index (BMI) of 31.0 to 31.9 in adult 04/02/2022   Anxiety and depression 05/11/2017   Frequent headaches 05/11/2017    Allergies:  Allergies  Allergen Reactions   Oxycodone Other (See Comments)    migraines   Nickel Rash   Red Dye #40 (Allura Red) Rash    Includes Benadryl as it has red dye in it.   Medications:  Current Outpatient  Medications:    sulfamethoxazole-trimethoprim (BACTRIM DS) 800-160 MG tablet, Take 1 tablet by mouth 2 (two) times daily for 7 days., Disp: 14 tablet, Rfl: 0   albuterol (VENTOLIN HFA) 108 (90 Base) MCG/ACT inhaler, TAKE TWO PUFFS BY MOUTH EVERY SIX HOURS AS NEEDED FOR WHEEZE, Disp: 8.5 g, Rfl: 0   buPROPion (WELLBUTRIN XL) 150 MG 24 hr tablet, Take 2 tablets (300 mg total) by mouth daily., Disp: 180 tablet, Rfl: 1   cetirizine (ZYRTEC) 10 MG tablet, Take 1 tablet (10 mg total) by mouth every evening., Disp: 90 tablet, Rfl: 3    cyclobenzaprine (FLEXERIL) 5 MG tablet, Take 1 tablet (5 mg total) by mouth at bedtime., Disp: 15 tablet, Rfl: 0   escitalopram (LEXAPRO) 20 MG tablet, TAKE ONE TABLET (20 MG TOTAL) BY MOUTH DAILY., Disp: 90 tablet, Rfl: 0   ibuprofen (ADVIL) 800 MG tablet, Take 1 tablet (800 mg total) by mouth every 8 (eight) hours as needed (pain)., Disp: 30 tablet, Rfl: 0   omeprazole (PRILOSEC) 20 MG capsule, Take 1 capsule (20 mg total) by mouth 2 (two) times daily before a meal., Disp: 180 capsule, Rfl: 1   ondansetron (ZOFRAN) 4 MG tablet, Take 1 tablet (4 mg total) by mouth every 8 (eight) hours as needed for nausea or vomiting., Disp: 20 tablet, Rfl: 0   tirzepatide (MOUNJARO) 2.5 MG/0.5ML Pen, Inject 2.5 mg into the skin once a week., Disp: 6 mL, Rfl: 0  Observations/Objective: Patient is well-developed, well-nourished in no acute distress.  Resting comfortably in car Head is normocephalic, atraumatic.  No labored breathing.  Speech is clear and coherent with logical content.  Patient is alert and oriented at baseline.  There is an erythematous swollen area of skin on the back of the left upper leg approximately 3cm in diameter  Assessment and Plan: 1. Cellulitis of left lower extremity (Primary) - sulfamethoxazole-trimethoprim (BACTRIM DS) 800-160 MG tablet; Take 1 tablet by mouth 2 (two) times daily for 7 days.  Dispense: 14 tablet; Refill: 0   Follow Up Instructions: I discussed the assessment and treatment plan with the patient. The patient was provided an opportunity to ask questions and all were answered. The patient agreed with the plan and demonstrated an understanding of the instructions.  A copy of instructions were sent to the patient via MyChart unless otherwise noted below.     The patient was advised to call back or seek an in-person evaluation if the symptoms worsen or if the condition fails to improve as anticipated.    Claiborne Rigg, NP

## 2023-12-21 ENCOUNTER — Encounter: Payer: Self-pay | Admitting: Nurse Practitioner

## 2023-12-22 ENCOUNTER — Telehealth: Admitting: Physician Assistant

## 2023-12-22 DIAGNOSIS — L03119 Cellulitis of unspecified part of limb: Secondary | ICD-10-CM | POA: Diagnosis not present

## 2023-12-22 DIAGNOSIS — S70369A Insect bite (nonvenomous), unspecified thigh, initial encounter: Secondary | ICD-10-CM | POA: Diagnosis not present

## 2023-12-22 DIAGNOSIS — W57XXXA Bitten or stung by nonvenomous insect and other nonvenomous arthropods, initial encounter: Secondary | ICD-10-CM | POA: Diagnosis not present

## 2023-12-22 MED ORDER — TRIAMCINOLONE ACETONIDE 0.1 % EX CREA: 1.0000 | TOPICAL_CREAM | Freq: Two times a day (BID) | CUTANEOUS | 0 refills | Status: DC

## 2023-12-22 NOTE — Addendum Note (Signed)
 Addended by: Margaretann Loveless on: 12/22/2023 01:00 PM   Modules accepted: Orders, Level of Service

## 2023-12-22 NOTE — Progress Notes (Signed)
 E Visit for Rash  We are sorry that you are not feeling well. Here is how we plan to help!  Based on what you shared with me you may have an allergic reaction and cellulitis from a recent insect sting/bite.   I have prescribed Triamcinolone cream 0.1% Apply topically twice daily as needed.   HOME CARE:  Take cool showers and avoid direct sunlight. Apply cool compress or wet dressings. Take a bath in an oatmeal bath.  Sprinkle content of one Aveeno packet under running faucet with comfortably warm water.  Bathe for 15-20 minutes, 1-2 times daily.  Pat dry with a towel. Do not rub the rash. Use hydrocortisone cream. Take an antihistamine like Benadryl for widespread rashes that itch.  The adult dose of Benadryl is 25-50 mg by mouth 4 times daily. Caution:  This type of medication may cause sleepiness.  Do not drink alcohol, drive, or operate dangerous machinery while taking antihistamines.  Do not take these medications if you have prostate enlargement.  Read package instructions thoroughly on all medications that you take.  GET HELP RIGHT AWAY IF:  Symptoms don't go away after treatment. Severe itching that persists. If you rash spreads or swells. If you rash begins to smell. If it blisters and opens or develops a yellow-brown crust. You develop a fever. You have a sore throat. You become short of breath.  MAKE SURE YOU:  Understand these instructions. Will watch your condition. Will get help right away if you are not doing well or get worse.  Thank you for choosing an e-visit.  Your e-visit answers were reviewed by a board certified advanced clinical practitioner to complete your personal care plan. Depending upon the condition, your plan could have included both over the counter or prescription medications.  Please review your pharmacy choice. Make sure the pharmacy is open so you can pick up prescription now. If there is a problem, you may contact your provider through  [employer] and have the prescription routed to another pharmacy.  Your safety is important to [place]. If you have drug allergies check your prescription carefully.   For the next 24 hours you can use MyChart to ask questions about today's visit, request a non-urgent call back, or ask for a work or school excuse. You will get an email in the next two days asking about your experience. I hope that your e-visit has been valuable and will speed your recovery.   I have spent 5 minutes in review of e-visit questionnaire, review and updating patient chart, medical decision making and response to patient.   Margaretann Loveless, PA-C

## 2023-12-22 NOTE — Progress Notes (Signed)
 Because you have failed a first-line treatment, I feel your condition warrants further evaluation and I recommend that you be seen for a face to face visit.  Please contact your primary care physician practice to be seen. Many offices offer virtual options to be seen via video if you are not comfortable going in person to a medical facility at this time.  NOTE: You will NOT be charged for this eVisit.  If you do not have a PCP, [place] offers a free physician referral service available at [phone]. Our trained staff has the experience, knowledge and resources to put you in touch with a physician who is right for you.    If you are having a true medical emergency please call 911.   Your e-visit answers were reviewed by a board certified advanced clinical practitioner to complete your personal care plan.  Thank you for using e-Visits.   I have spent 5 minutes in review of e-visit questionnaire, review and updating patient chart, medical decision making and response to patient.   Margaretann Loveless, PA-C

## 2023-12-23 ENCOUNTER — Other Ambulatory Visit: Payer: Self-pay

## 2023-12-23 ENCOUNTER — Emergency Department
Admission: EM | Admit: 2023-12-23 | Discharge: 2023-12-23 | Disposition: A | Discharge status: Home / Self Care | Attending: Emergency Medicine | Admitting: Emergency Medicine

## 2023-12-23 DIAGNOSIS — L02416 Cutaneous abscess of left lower limb: Secondary | ICD-10-CM | POA: Diagnosis present

## 2023-12-23 DIAGNOSIS — L0291 Cutaneous abscess, unspecified: Secondary | ICD-10-CM

## 2023-12-23 MED ORDER — LIDOCAINE-EPINEPHRINE-TETRACAINE (LET) TOPICAL GEL
3.0000 mL | Freq: Once | TOPICAL | Status: AC
  Administered 2023-12-23: 3 mL via TOPICAL
  Filled 2023-12-23: qty 3

## 2023-12-23 MED ORDER — IBUPROFEN 600 MG PO TABS
600.0000 mg | ORAL_TABLET | Freq: Once | ORAL | Status: AC
  Administered 2023-12-23: 600 mg via ORAL
  Filled 2023-12-23: qty 1

## 2023-12-23 MED ORDER — LIDOCAINE HCL (PF) 1 % IJ SOLN
5.0000 mL | Freq: Once | INTRAMUSCULAR | Status: AC
  Administered 2023-12-23: 5 mL
  Filled 2023-12-23: qty 5

## 2023-12-23 NOTE — Discharge Instructions (Addendum)
 Have been diagnosed with abscess in your left leg.  Please continue taking cephalexin, Bactrim ordered in by urgent care provider.  Please change dressing tomorrow night.  If you have new symptoms or symptoms worsen you can come back to ED or go to your PCP or go to the [facility] at [facility].  You can take ibuprofen 600 mg for pain every 8 hours after meals.

## 2023-12-23 NOTE — ED Provider Notes (Signed)
 [facility] Provider Note    Event Date/Time   First MD Initiated Contact with Patient 12/23/23 1827     (approximate)   History   Abscess and Insect Bite   HPI  Sonya Spencer is a 38 y.o. female presents today with history of 3 days of edema, warmth, on the posterior medial third of the left  thigh, chills.  Patient thinks she had insect bite.  Patient was seen in urgent care on Monday, they prescribed cephalexin, yesterday she was prescribed Bactrim and triamcinolone cream.  Patient states no improvement but worsening of the pain.  Patient denies fever.     Physical Exam   Triage Vital Signs: ED Triage Vitals  Encounter Vitals Group     BP 12/23/23 1736 (!) 114/58     Systolic BP Percentile --      Diastolic BP Percentile --      Pulse Rate 12/23/23 1736 77     Resp 12/23/23 1736 20     Temp 12/23/23 1736 98.3 F (36.8 C)     Temp Source 12/23/23 1736 Oral     SpO2 12/23/23 1736 100 %     Weight 12/23/23 1734 140 lb (63.5 kg)     Height 12/23/23 1734 5\' 3"  (1.6 m)     Head Circumference --      Peak Flow --      Pain Score 12/23/23 1734 8     Pain Loc --      Pain Education --      Exclude from Growth Chart --     Most recent vital signs: Vitals:   12/23/23 1736  BP: (!) 114/58  Pulse: 77  Resp: 20  Temp: 98.3 F (36.8 C)  SpO2: 100%     Constitutional: Alert, NAD. Able to speak in complete sentences without cough or dyspnea  Eyes: Conjunctivae are normal.  Head: Atraumatic. Nose: No congestion/rhinnorhea. Mouth/Throat: Mucous membranes are moist.   Neck: Painless ROM. Supple. No JVD, nodes, thyromegaly  Cardiovascular:   Good peripheral circulation.RRR no murmurs, gallops, rubs  Respiratory: Normal respiratory effort.  No retractions. Clear to auscultation bilaterally without wheezing or crackles  Gastrointestinal: Soft and nontender.  Musculoskeletal:  no deformity Left tight: Skin is intact, presence of erythema,  induration, circular area about 7 cm.  Neurologic:  MAE spontaneously. No gross focal neurologic deficits are appreciated.  Skin:  Skin is warm, dry and intact. No rash noted. Psychiatric: Mood and affect are normal. Speech and behavior are normal.    ED Results / Procedures / Treatments   Labs (all labs ordered are listed, but only abnormal results are displayed) Labs Reviewed - No data to display   EKG     RADIOLOGY     PROCEDURES:  Critical Care performed:   .Incision and Drainage  Date/Time: 12/23/2023 7:59 PM  Performed by: Gladys Damme, PA-C Authorized by: Gladys Damme, PA-C   Consent:    Consent obtained:  Verbal   Consent given by:  Patient   Risks, benefits, and alternatives were discussed: yes     Risks discussed:  Bleeding, incomplete drainage and pain Universal protocol:    Patient identity confirmed:  Verbally with patient Location:    Type:  Abscess   Location:  Lower extremity   Lower extremity location:  Leg   Leg location:  L upper leg Pre-procedure details:    Skin preparation:  Chlorhexidine and povidone-iodine Anesthesia:    Anesthesia method:  Local  infiltration   Local anesthetic:  Lidocaine 1% WITH epi Procedure type:    Complexity:  Simple Procedure details:    Ultrasound guidance: no     Incision types:  Stab incision   Wound management:  Extensive cleaning   Drainage:  Purulent   Drainage amount:  Moderate   Wound treatment:  Wound left open Post-procedure details:    Procedure completion:  Tolerated well, no immediate complications    MEDICATIONS ORDERED IN ED: Medications  lidocaine (PF) (XYLOCAINE) 1 % injection 5 mL (5 mLs Other Given 12/23/23 1944)  lidocaine-EPINEPHrine-tetracaine (LET) topical gel (3 mLs Topical Given 12/23/23 1944)  ibuprofen (ADVIL) tablet 600 mg (600 mg Oral Given 12/23/23 1916)      IMPRESSION / MDM / ASSESSMENT AND PLAN / ED COURSE  I reviewed the triage vital signs and the nursing  notes.  Differential diagnosis includes, but is not limited to, cellulitis, foreign body, muscle strain  Patient's presentation is most consistent with acute, uncomplicated illness.   Patient's diagnosis is consistent with left tight abscess. I did review the patient's allergies and medications.During admission patient received ibuprofen 600 mg for pain, I did not prescribe any antibiotics because she is already taking cephalexin and Bactrim prescribed by urgent care provider.  The patient is in stable and satisfactory condition for discharge home  Patient will be discharged home without prescriptions. Patient is to follow up with PCP as needed or otherwise directed. Patient is given ED precautions to return to the ED for any worsening or new symptoms. Discussed plan of care with patient, answered all of patient's questions, Patient agreeable to plan of care. Advised patient to take medications according to the instructions on the label. Discussed possible side effects of new medications. Patient verbalized understanding.    FINAL CLINICAL IMPRESSION(S) / ED DIAGNOSES   Final diagnoses:  Abscess     Rx / DC Orders   ED Discharge Orders     None        Note:  This document was prepared using Dragon voice recognition software and may include unintentional dictation errors.   Gladys Damme, PA-C 12/23/23 Sonya Aloe, MD 12/23/23 2202

## 2023-12-23 NOTE — ED Triage Notes (Signed)
 Pt to ED for redness and swollen area to back of L thigh since 6 days ago. Went to UC, on 2 different abx since 2 days ago and states area is worse/more swollen/red and is not getting better.

## 2023-12-31 ENCOUNTER — Ambulatory Visit
Admission: RE | Admit: 2023-12-31 | Discharge: 2023-12-31 | Disposition: A | Discharge status: Home / Self Care | Source: Ambulatory Visit | Attending: Emergency Medicine | Admitting: Emergency Medicine

## 2023-12-31 ENCOUNTER — Other Ambulatory Visit: Payer: Self-pay

## 2023-12-31 VITALS — BP 100/66 | HR 78 | Temp 98.8°F | Resp 18

## 2023-12-31 DIAGNOSIS — L02416 Cutaneous abscess of left lower limb: Secondary | ICD-10-CM

## 2023-12-31 MED ORDER — FLUCONAZOLE 150 MG PO TABS: 150.0000 mg | ORAL_TABLET | Freq: Every day | ORAL | 0 refills | Status: AC

## 2023-12-31 MED ORDER — DOXYCYCLINE HYCLATE 100 MG PO CAPS: 100.0000 mg | ORAL_CAPSULE | Freq: Two times a day (BID) | ORAL | 0 refills | Status: DC

## 2023-12-31 NOTE — ED Triage Notes (Signed)
 Patient presents to [facility] for evaluation of an abscess x 2 weeks that she has been treating with antibiotics and had it lanced in the ED.  Area has improved greatly but still a small area of hardness/swelling that patient wants to be sure does not need any additional treatment, since she was done with the abx yesterday.

## 2023-12-31 NOTE — ED Provider Notes (Signed)
 Sonya Spencer    CSN: [id number] Arrival date & time: 12/31/23  0940      History   Chief Complaint Chief Complaint  Patient presents with   Follow-up    Entered by patient   Wound Check    HPI Sonya Spencer is a 38 y.o. female.   Patient presents for evaluation of abscess present to the posterior left thigh for 2 weeks.  Completed e-visit initially and was prescribed Bactrim and cephalexin, evaluate waiting in the emergency department 8 days ago where I&D completed.  Endorses symptoms have significantly improved however there is still a small area of redness which is firm and attempting to drain which has her concerned.  Denies fever.  Completed antibiotics 1 day ago.  Past Medical History:  Diagnosis Date   Abnormal glucose tolerance test (GTT) during pregnancy, antepartum 12/23/2017   - pt check BG log.   - Lifestyles referral for presumptive GDM (also due to inordinate weight loss early in pregnancy) - will treat as GDM.  Patient has refused 3 hour gtt.     Allergy    Anemia    with pregnancy only   Anxiety    Asthma    only with pregnancy   Cholelithiasis    Complication of anesthesia    panic attack with first panic   Depression    Fetal macrosomia during pregnancy in third trimester 03/25/2015   Resolved with delivery    Frequent headaches    migraines with pregnancy   GERD (gastroesophageal reflux disease)    Gestational diabetes    Gestational diabetes mellitus (GDM) affecting third pregnancy 02/05/2018   H/O maternal third degree perineal laceration, currently pregnant    History of Papanicolaou smear of cervix 10/10/11; 08/02/14   neg, ct neg; neg ct//gc/tr neg   PONV (postoperative nausea and vomiting)    Supervision of normal pregnancy 11/09/2017   [facility]inic [facility] Prenatal Labs Dating  Blood type:    Genetic Screen 1 Screen:    AFP:     Quad:     NIPS: Antibody:  Anatomic [place]  Rubella:   Varicella:   GTT Early:               Third trimester:  RPR:     Rhogam  HBsAg:    TDaP vaccine                       Flu Shot: HIV:    Baby Food                                GBS:  Contraception  Pap: CBB    CS/VBAC   Support Person         Urinary incontinence     Patient Active Problem List   Diagnosis Date Noted   Thrombocytosis 09/29/2023   Iron deficiency anemia 03/12/2023   Chronic pain of both shoulders 03/12/2023   GERD (gastroesophageal reflux disease) 04/02/2022   Mixed hyperlipidemia 04/02/2022   Class 1 obesity due to excess calories with body mass index (BMI) of 31.0 to 31.9 in adult 04/02/2022   Anxiety and depression 05/11/2017   Frequent headaches 05/11/2017    Past Surgical History:  Procedure Laterality Date   CESAREAN SECTION N/A 03/22/2015   Procedure: CESAREAN SECTION;  Surgeon: Conard Novak, MD;  Location: [facility] ORS;  Service: Obstetrics;  Laterality: N/A;   CESAREAN SECTION  WITH BILATERAL TUBAL LIGATION N/A 06/24/2018   Procedure: CESAREAN SECTION WITH BILATERAL TUBAL LIGATION;  Surgeon: Conard Novak, MD;  Location: [facility] ORS;  Service: Obstetrics;  Laterality: N/A;   CHOLECYSTECTOMY N/A 10/11/2018   Procedure: LAPAROSCOPIC CHOLECYSTECTOMY;  Surgeon: Ancil Linsey, MD;  Location: [facility] ORS;  Service: General;  Laterality: N/A;   FOREIGN BODY REMOVAL     MOUTH SURGERY      OB History     Gravida  4   Para  4   Term  4   Preterm  0   AB  0   Living  4      SAB  0   IAB  0   Ectopic  0   Multiple  0   Live Births  4        Obstetric Comments  1st Menstrual Cycle:  13 1st Pregnancy:  23           Home Medications    Prior to Admission medications   Medication Sig Start Date End Date Taking? Authorizing Provider  doxycycline (VIBRAMYCIN) 100 MG capsule Take 1 capsule (100 mg total) by mouth 2 (two) times daily. 12/31/23  Yes Isidra Mings R, NP  fluconazole (DIFLUCAN) 150 MG tablet Take 1 tablet (150 mg total) by mouth daily for 2 doses. 12/31/23 01/02/24 Yes Zyionna Pesce, Elita Boone, NP   albuterol (VENTOLIN HFA) 108 (90 Base) MCG/ACT inhaler TAKE TWO PUFFS BY MOUTH EVERY SIX HOURS AS NEEDED FOR WHEEZE 12/18/22   Lorre Munroe, NP  buPROPion (WELLBUTRIN XL) 150 MG 24 hr tablet Take 2 tablets (300 mg total) by mouth daily. 02/11/23   Lorre Munroe, NP  cetirizine (ZYRTEC) 10 MG tablet Take 1 tablet (10 mg total) by mouth every evening. 10/06/19   Karamalegos, Netta Neat, DO  cyclobenzaprine (FLEXERIL) 5 MG tablet Take 1 tablet (5 mg total) by mouth at bedtime. 08/13/23   Menshew, Charlesetta Ivory, PA-C  escitalopram (LEXAPRO) 20 MG tablet TAKE ONE TABLET (20 MG TOTAL) BY MOUTH DAILY. 12/01/23   Lorre Munroe, NP  ibuprofen (ADVIL) 800 MG tablet Take 1 tablet (800 mg total) by mouth every 8 (eight) hours as needed (pain). 09/14/23   Phineas Semen, MD  omeprazole (PRILOSEC) 20 MG capsule Take 1 capsule (20 mg total) by mouth 2 (two) times daily before a meal. 06/19/23   Baity, Salvadore Oxford, NP  ondansetron (ZOFRAN) 4 MG tablet Take 1 tablet (4 mg total) by mouth every 8 (eight) hours as needed for nausea or vomiting. 04/06/23   Lorre Munroe, NP  tirzepatide [facility]) 2.5 MG/0.5ML Pen Inject 2.5 mg into the skin once a week. 10/30/23   Lorre Munroe, NP  triamcinolone cream (KENALOG) 0.1 % Apply 1 Application topically 2 (two) times daily. 12/22/23   Margaretann Loveless, PA-C    Family History Family History  Problem Relation Age of Onset   Lupus Mother    Hepatitis C Mother    Depression Mother    Diabetes type I Father    Thyroid disease Father    Lupus Sister    Heart attack Sister    CVA Sister    Lupus Maternal Grandmother    Diabetes Maternal Grandmother    Diabetes Paternal Grandfather        type 1   Diabetes type I Daughter    Diabetes Daughter    Lupus Maternal Aunt    Diabetes Paternal Aunt  type 1 - runs in father's side    Social History Social History   Tobacco Use   Smoking status: Former    Current packs/day: 0.00    Average packs/day: 0.3  packs/day for 2.0 years (0.5 ttl pk-yrs)    Types: Cigarettes    Start date: 03/14/2007    Quit date: 03/13/2009    Years since quitting: 14.8   Smokeless tobacco: Never  Vaping Use   Vaping status: Every Day   Substances: THC, CBD  Substance Use Topics   Alcohol use: Not Currently    Alcohol/week: 3.0 standard drinks of alcohol    Types: 3 Shots of liquor per week   Drug use: No    Comment: THC pens     Allergies   Oxycodone, Nickel, and Red dye #40 (allura red)   Review of Systems Review of Systems   Physical Exam Triage Vital Signs ED Triage Vitals [12/31/23 1003]  Encounter Vitals Group     BP 100/66     Systolic BP Percentile      Diastolic BP Percentile      Pulse Rate 78     Resp 18     Temp 98.8 F (37.1 C)     Temp Source Oral     SpO2 98 %     Weight      Height      Head Circumference      Peak Flow      Pain Score 0     Pain Loc      Pain Education      Exclude from Growth Chart    No data found.  Updated Vital Signs BP 100/66 (BP Location: Left Arm)   Pulse 78   Temp 98.8 F (37.1 C) (Oral)   Resp 18   LMP 12/13/2023 (Approximate)   SpO2 98%   Visual Acuity Right Eye Distance:   Left Eye Distance:   Bilateral Distance:    Right Eye Near:   Left Eye Near:    Bilateral Near:     Physical Exam Constitutional:      Appearance: Normal appearance.  Eyes:     Extraocular Movements: Extraocular movements intact.  Pulmonary:     Effort: Pulmonary effort is normal.  Skin:    Comments: 1 x 2 immature abscess present to the posterior left thigh, 0.5 cm incision adjacent to new abscess  Neurological:     Mental Status: She is alert and oriented to person, place, and time. Mental status is at baseline.      UC Treatments / Results  Labs (all labs ordered are listed, but only abnormal results are displayed) Labs Reviewed - No data to display  EKG   Radiology No results found.  Procedures Procedures (including critical care  time)  Medications Ordered in UC Medications - No data to display  Initial Impression / Assessment and Plan / UC Course  I have reviewed the triage vital signs and the nursing notes.  Pertinent labs & imaging results that were available during my care of the patient were reviewed by me and considered in my medical decision making (see chart for details).  Abscess of left thigh  Able to notate where I&D completed as incision is still present, peers to be healing appropriately, immature firm abscess adjacent to incision site therefore initiating additional antibiotics, prescribed doxycycline, Flagyl prescribed prophylactically as this is her third antibiotic within 2 weeks ,patient endorses seeing drainage from new site therefore will not  attempt I&D today, recommended warm compresses and over-the-counter analgesics with follow-up as needed Final Clinical Impressions(s) / UC Diagnoses   Final diagnoses:  Abscess of left thigh     Discharge Instructions      Take doxycycline twice daily for 10 days  If beginning to have vaginal irritation or itching May take 1 Diflucan tablet at the onset of symptoms then take second tablet after completion of all medication  Hold warm-hot compresses to affected area at least 4 times a day, this helps to facilitate draining, the more the better  Please return for evaluation for increased swelling, increased tenderness or pain, non healing site, non draining site, you begin to have fever or chills   We reviewed the etiology of recurrent abscesses of skin.  Skin abscesses are collections of pus within the dermis and deeper skin tissues. Skin abscesses manifest as painful, tender, fluctuant, and erythematous nodules, frequently surmounted by a pustule and surrounded by a rim of erythematous swelling.  Spontaneous drainage of purulent material may occur.  Fever can occur on occasion.    -Skin abscesses can develop in healthy individuals with no predisposing  conditions other than skin or nasal carriage of Staphylococcus aureus.  Individuals in close contact with others who have active infection with skin abscesses are at increased risk which is likely to explain why twin brother has similar episodes.   In addition, any process leading to a breach in the skin barrier can also predispose to the development of a skin abscesses, such as atopic dermatitis.      ED Prescriptions     Medication Sig Dispense Auth. Provider   doxycycline (VIBRAMYCIN) 100 MG capsule Take 1 capsule (100 mg total) by mouth 2 (two) times daily. 20 capsule Coree Riester R, NP   fluconazole (DIFLUCAN) 150 MG tablet Take 1 tablet (150 mg total) by mouth daily for 2 doses. 2 tablet Valinda Hoar, NP      PDMP not reviewed this encounter.   Valinda Hoar, NP 12/31/23 1031

## 2023-12-31 NOTE — Discharge Instructions (Signed)
 Take doxycycline twice daily for 10 days  If beginning to have vaginal irritation or itching May take 1 Diflucan tablet at the onset of symptoms then take second tablet after completion of all medication  Hold warm-hot compresses to affected area at least 4 times a day, this helps to facilitate draining, the more the better  Please return for evaluation for increased swelling, increased tenderness or pain, non healing site, non draining site, you begin to have fever or chills   We reviewed the etiology of recurrent abscesses of skin.  Skin abscesses are collections of pus within the dermis and deeper skin tissues. Skin abscesses manifest as painful, tender, fluctuant, and erythematous nodules, frequently surmounted by a pustule and surrounded by a rim of erythematous swelling.  Spontaneous drainage of purulent material may occur.  Fever can occur on occasion.    -Skin abscesses can develop in healthy individuals with no predisposing conditions other than skin or nasal carriage of Staphylococcus aureus.  Individuals in close contact with others who have active infection with skin abscesses are at increased risk which is likely to explain why twin brother has similar episodes.   In addition, any process leading to a breach in the skin barrier can also predispose to the development of a skin abscesses, such as atopic dermatitis.

## 2024-01-07 ENCOUNTER — Inpatient Hospital Stay: Admitting: Physician Assistant

## 2024-01-28 ENCOUNTER — Other Ambulatory Visit: Payer: Self-pay | Admitting: Internal Medicine

## 2024-01-28 NOTE — Telephone Encounter (Signed)
 Requested medication (s) are due for refill today - no  Requested medication (s) are on the active medication list -no  Future visit scheduled -yes  Last refill: medication not listed on current medication list  Notes to clinic: off protocol- provider review   Requested Prescriptions  Pending Prescriptions Disp Refills   ZEPBOUND  5 MG/0.5ML Pen [Pharmacy Med Name: Zepbound  5 MG/0.5ML Subcutaneous Solution Auto-injector] 12 mL 0    Sig: INJECT ONE SYRINGEFUL SUBCUTANEOUSLY ONCE WEEKLY     Off-Protocol Failed - 01/28/2024  8:40 AM      Failed - Medication not assigned to a protocol, review manually.      Failed - Valid encounter within last 12 months    Recent Outpatient Visits   None     Future Appointments             In 1 month Baity, Rankin Buzzard, NP [facility], [facility]               Requested Prescriptions  Pending Prescriptions Disp Refills   ZEPBOUND  5 MG/0.5ML Pen [Pharmacy Med Name: Zepbound  5 MG/0.5ML Subcutaneous Solution Auto-injector] 12 mL 0    Sig: INJECT ONE SYRINGEFUL SUBCUTANEOUSLY ONCE WEEKLY     Off-Protocol Failed - 01/28/2024  8:40 AM      Failed - Medication not assigned to a protocol, review manually.      Failed - Valid encounter within last 12 months    Recent Outpatient Visits   None     Future Appointments             In 1 month Baity, Rankin Buzzard, NP [place] [facility], [facility]

## 2024-02-04 ENCOUNTER — Ambulatory Visit: Admitting: Physician Assistant

## 2024-03-15 ENCOUNTER — Ambulatory Visit: Payer: Self-pay | Admitting: Internal Medicine

## 2024-03-23 ENCOUNTER — Other Ambulatory Visit: Payer: Self-pay | Admitting: Internal Medicine

## 2024-03-24 NOTE — Telephone Encounter (Signed)
 Requested medications are due for refill today.  unsure  Requested medications are on the active medications list.  yes  Last refill. 02/11/2023 #180 1 rf  Future visit scheduled.   no  Notes to clinic.  Rx request and med list dosage do not match. Please review.    Requested Prescriptions  Pending Prescriptions Disp Refills   buPROPion  (WELLBUTRIN  XL) 150 MG 24 hr tablet [Pharmacy Med Name: BUPROPION  HYDROCHLORIDE ER (XL) 150MG  XL TABLET ER 24HR] 90 tablet 1    Sig: TAKE ONE TABLET (150 MG TOTAL) BY MOUTH DAILY.     Psychiatry: Antidepressants - bupropion  Failed - 03/24/2024  3:06 PM      Failed - AST in normal range and within 360 days    AST  Date Value Ref Range Status  06/16/2023 34 (H) 10 - 30 U/L Final   SGOT(AST)  Date Value Ref Range Status  10/08/2014 51 (H) 15 - 37 Unit/L Final         Failed - Valid encounter within last 6 months    Recent Outpatient Visits   None            Passed - Cr in normal range and within 360 days    Creat  Date Value Ref Range Status  06/16/2023 0.52 0.50 - 0.97 mg/dL Final   Creatinine, Ser  Date Value Ref Range Status  09/14/2023 0.65 0.44 - 1.00 mg/dL Final         Passed - ALT in normal range and within 360 days    ALT  Date Value Ref Range Status  06/16/2023 22 6 - 29 U/L Final   SGPT (ALT)  Date Value Ref Range Status  10/08/2014 37 U/L Final    Comment:    14-63 NOTE: New Reference Range 04/18/14          Passed - Completed PHQ-2 or PHQ-9 in the last 360 days      Passed - Last BP in normal range    BP Readings from Last 1 Encounters:  12/31/23 100/66

## 2024-04-14 ENCOUNTER — Encounter: Payer: Self-pay | Admitting: Internal Medicine

## 2024-04-16 ENCOUNTER — Telehealth

## 2024-04-18 ENCOUNTER — Telehealth

## 2024-04-21 ENCOUNTER — Ambulatory Visit: Admitting: Internal Medicine

## 2024-04-25 ENCOUNTER — Ambulatory Visit (INDEPENDENT_AMBULATORY_CARE_PROVIDER_SITE_OTHER): Admitting: Internal Medicine

## 2024-04-25 ENCOUNTER — Encounter: Payer: Self-pay | Admitting: Internal Medicine

## 2024-04-25 VITALS — BP 110/68 | Ht 63.0 in | Wt 156.8 lb

## 2024-04-25 DIAGNOSIS — Z0001 Encounter for general adult medical examination with abnormal findings: Secondary | ICD-10-CM | POA: Diagnosis not present

## 2024-04-25 DIAGNOSIS — E663 Overweight: Secondary | ICD-10-CM | POA: Diagnosis not present

## 2024-04-25 DIAGNOSIS — R739 Hyperglycemia, unspecified: Secondary | ICD-10-CM | POA: Diagnosis not present

## 2024-04-25 DIAGNOSIS — Z6827 Body mass index (BMI) 27.0-27.9, adult: Secondary | ICD-10-CM

## 2024-04-25 DIAGNOSIS — E782 Mixed hyperlipidemia: Secondary | ICD-10-CM | POA: Diagnosis not present

## 2024-04-25 MED ORDER — ESCITALOPRAM OXALATE 20 MG PO TABS
20.0000 mg | ORAL_TABLET | Freq: Every day | ORAL | 1 refills | Status: AC
Start: 2024-04-25 — End: ?

## 2024-04-25 MED ORDER — SEMAGLUTIDE-WEIGHT MANAGEMENT 0.25 MG/0.5ML ~~LOC~~ SOAJ: 0.2500 mg | SUBCUTANEOUS | 0 refills | Status: DC

## 2024-04-25 MED ORDER — CYCLOBENZAPRINE HCL 5 MG PO TABS: 5.0000 mg | ORAL_TABLET | Freq: Every day | ORAL | 0 refills | Status: DC

## 2024-04-25 MED ORDER — OMEPRAZOLE 20 MG PO CPDR
20.0000 mg | DELAYED_RELEASE_CAPSULE | Freq: Two times a day (BID) | ORAL | 1 refills | Status: AC
Start: 2024-04-25 — End: ?

## 2024-04-25 MED ORDER — BUPROPION HCL ER (XL) 150 MG PO TB24
300.0000 mg | ORAL_TABLET | Freq: Every day | ORAL | 1 refills | Status: AC
Start: 2024-04-25 — End: ?

## 2024-04-25 NOTE — Assessment & Plan Note (Signed)
 Encourage high-protein, low-carb diet and exercise for weight loss She reports insurance will no longer cover Zepbound , will trial Wegovy 

## 2024-04-25 NOTE — Patient Instructions (Signed)

## 2024-04-25 NOTE — Progress Notes (Signed)
 Subjective:    Patient ID: Sonya Spencer, female    DOB: 10/02/1985, 38 y.o.   MRN: [id number]  HPI  Patient presents to clinic today for annual exam.  Flu: 05/2023 Tetanus: 04/2018 COVID: Never Pap smear: 07/2018 Dentist: biannually  Diet: She does eat meat. She consumes some fruits and veggies. She tries to avoid fried foods. She drinks mostly water, soda. Exercise: Walking, tai chi, yoga  Review of Systems     Past Medical History:  Diagnosis Date   Abnormal glucose tolerance test (GTT) during pregnancy, antepartum 12/23/2017   - pt check BG log.   - Lifestyles referral for presumptive GDM (also due to inordinate weight loss early in pregnancy) - will treat as GDM.  Patient has refused 3 hour gtt.     Allergy    Anemia    with pregnancy only   Anxiety    Asthma    only with pregnancy   Cholelithiasis    Complication of anesthesia    panic attack with first panic   Depression    Fetal macrosomia during pregnancy in third trimester 03/25/2015   Resolved with delivery    Frequent headaches    migraines with pregnancy   GERD (gastroesophageal reflux disease)    Gestational diabetes    Gestational diabetes mellitus (GDM) affecting third pregnancy 02/05/2018   H/O maternal third degree perineal laceration, currently pregnant    History of Papanicolaou smear of cervix 10/10/11; 08/02/14   neg, ct neg; neg ct//gc/tr neg   PONV (postoperative nausea and vomiting)    Supervision of normal pregnancy 11/09/2017   Clinic [facility] Prenatal Labs Dating  Blood type:    Genetic Screen 1 Screen:    AFP:     Quad:     NIPS: Antibody:  Anatomic US   Rubella:   Varicella:   GTT Early:               Third trimester:  RPR:    Rhogam  HBsAg:    TDaP vaccine                       Flu Shot: HIV:    Baby Food                                GBS:  Contraception  Pap: CBB    CS/VBAC   Support Person         Urinary incontinence     Current Outpatient Medications  Medication Sig Dispense Refill    albuterol  (VENTOLIN  HFA) 108 (90 Base) MCG/ACT inhaler TAKE TWO PUFFS BY MOUTH EVERY SIX HOURS AS NEEDED FOR WHEEZE 8.5 g 0   buPROPion  (WELLBUTRIN  XL) 150 MG 24 hr tablet Take 2 tablets (300 mg total) by mouth daily. 180 tablet 1   cetirizine  (ZYRTEC ) 10 MG tablet Take 1 tablet (10 mg total) by mouth every evening. 90 tablet 3   cyclobenzaprine  (FLEXERIL ) 5 MG tablet Take 1 tablet (5 mg total) by mouth at bedtime. 15 tablet 0   doxycycline  (VIBRAMYCIN ) 100 MG capsule Take 1 capsule (100 mg total) by mouth 2 (two) times daily. 20 capsule 0   escitalopram  (LEXAPRO ) 20 MG tablet TAKE ONE TABLET (20 MG TOTAL) BY MOUTH DAILY. 90 tablet 0   ibuprofen  (ADVIL ) 800 MG tablet Take 1 tablet (800 mg total) by mouth every 8 (eight) hours as needed (pain). 30 tablet 0  omeprazole  (PRILOSEC) 20 MG capsule Take 1 capsule (20 mg total) by mouth 2 (two) times daily before a meal. 180 capsule 1   ondansetron  (ZOFRAN ) 4 MG tablet Take 1 tablet (4 mg total) by mouth every 8 (eight) hours as needed for nausea or vomiting. 20 tablet 0   triamcinolone  cream (KENALOG ) 0.1 % Apply 1 Application topically 2 (two) times daily. 30 g 0   ZEPBOUND  5 MG/0.5ML Pen INJECT ONE SYRINGEFUL SUBCUTANEOUSLY ONCE WEEKLY 12 mL 0   No current facility-administered medications for this visit.    Allergies  Allergen Reactions   Oxycodone  Other (See Comments)    migraines   Nickel Rash   Red Dye #40 (Allura Red) Rash    Includes Benadryl  as it has red dye in it.    Family History  Problem Relation Age of Onset   Lupus Mother    Hepatitis C Mother    Depression Mother    Diabetes type I Father    Thyroid disease Father    Lupus Sister    Heart attack Sister    CVA Sister    Lupus Maternal Grandmother    Diabetes Maternal Grandmother    Diabetes Paternal Grandfather        type 1   Diabetes type I Daughter    Diabetes Daughter    Lupus Maternal Aunt    Diabetes Paternal Aunt        type 1 - runs in father's side     Social History   Socioeconomic History   Marital status: Legally Separated    Spouse name: Not on file   Number of children: 3   Years of education: 14   Highest education level: Some college, no degree  Occupational History   Occupation: HOMEMAKER  Tobacco Use   Smoking status: Former    Current packs/day: 0.00    Average packs/day: 0.3 packs/day for 2.0 years (0.5 ttl pk-yrs)    Types: Cigarettes    Start date: 03/14/2007    Quit date: 03/13/2009    Years since quitting: 15.1   Smokeless tobacco: Never  Vaping Use   Vaping status: Every Day   Substances: THC, CBD  Substance and Sexual Activity   Alcohol use: Not Currently    Alcohol/week: 3.0 standard drinks of alcohol    Types: 3 Shots of liquor per week   Drug use: No    Comment: THC pens   Sexual activity: Yes    Birth control/protection: Surgical    Comment: Tubal Ligation  Other Topics Concern   Not on file  Social History Narrative   Not on file   Social Drivers of Health   Financial Resource Strain: Medium Risk (04/24/2024)   Overall Financial Resource Strain (CARDIA)    Difficulty of Paying Living Expenses: Somewhat hard  Food Insecurity: Food Insecurity Present (04/24/2024)   Hunger Vital Sign    Worried About Running Out of Food in the Last Year: Sometimes true    Ran Out of Food in the Last Year: Sometimes true  Transportation Needs: No Transportation Needs (04/24/2024)   PRAPARE - Administrator, Civil Service (Medical): No    Lack of Transportation (Non-Medical): No  Physical Activity: Insufficiently Active (04/24/2024)   Exercise Vital Sign    Days of Exercise per Week: 4 days    Minutes of Exercise per Session: 30 min  Stress: Stress Concern Present (04/24/2024)   [employer] of Occupational Health - Occupational Stress Questionnaire  Feeling of Stress: To some extent  Social Connections: Socially Isolated (04/24/2024)   Social Connection and Isolation Panel    Frequency of  Communication with Friends and Family: Twice a week    Frequency of Social Gatherings with Friends and Family: More than three times a week    Attends Religious Services: Never    Database administrator or Organizations: No    Attends Engineer, structural: Not on file    Marital Status: Separated  Intimate Partner Violence: Not on file     Constitutional: Patient reports intermittent headaches.  Denies fever, malaise, fatigue, or abrupt weight changes.  HEENT: Denies eye pain, eye redness, ear pain, ringing in the ears, wax buildup, runny nose, nasal congestion, bloody nose, or sore throat. Respiratory: Denies difficulty breathing, shortness of breath, cough or sputum production.   Cardiovascular: Denies chest pain, chest tightness, palpitations or swelling in the hands or feet.  Gastrointestinal: Denies abdominal pain, bloating, constipation, diarrhea or blood in the stool.  GU: Denies urgency, frequency, pain with urination, burning sensation, blood in urine, odor or discharge. Musculoskeletal: Pt reports bilateral shoulder pain, low back pain. Denies decrease in range of motion, difficulty with gait, muscle pain or joint swelling.  Skin: Denies redness, rashes, lesions or ulcercations.  Neurological: Denies dizziness, difficulty with memory, difficulty with speech or problems with balance and coordination.  Psych: Patient has a history of anxiety and depression.  Denies SI/HI.  No other specific complaints in a complete review of systems (except as listed in HPI above).  Objective:   Physical Exam  BP 110/68 (BP Location: Left Arm, Patient Position: Sitting, Cuff Size: Normal)   Ht 5' 3 (1.6 m)   Wt 156 lb 12.8 oz (71.1 kg)   LMP 04/04/2024 (Approximate)   BMI 27.78 kg/m    Wt Readings from Last 3 Encounters:  12/23/23 140 lb (63.5 kg)  09/29/23 178 lb 9.6 oz (81 kg)  09/14/23 170 lb (77.1 kg)    General: Appears her stated age, obese, in NAD. Skin: Warm, dry and  intact.  Multiple horizontal scars noted to the left upper arm and left wrist. HEENT: Head: normal shape and size; Eyes: sclera white, no icterus, conjunctiva pink, PERRLA and EOMs intact;  Neck:  Neck supple, trachea midline. No masses, lumps or thyromegaly present.  Cardiovascular: Normal rate and rhythm. S1,S2 noted.  No murmur, rubs or gallops noted. No JVD or BLE edema.  Pulmonary/Chest: Normal effort and positive vesicular breath sounds. No respiratory distress. No wheezes, rales or ronchi noted.  Abdomen: Normal bowel sounds.  Musculoskeletal: Strength 5/5 BUE/BLE. No difficulty with gait.  Neurological: Alert and oriented. Cranial nerves II-XII grossly intact. Coordination normal.  Psychiatric: Mood and affect normal. Behavior is normal. Judgment and thought content normal.     BMET    Component Value Date/Time   NA 138 09/14/2023 0832   NA 138 10/08/2014 1732   K 4.0 09/14/2023 0832   K 3.6 10/08/2014 1732   CL 102 09/14/2023 0832   CL 106 10/08/2014 1732   CO2 27 09/14/2023 0832   CO2 23 10/08/2014 1732   GLUCOSE 91 09/14/2023 0832   GLUCOSE 70 10/08/2014 1732   BUN 18 09/14/2023 0832   BUN 5 (L) 10/08/2014 1732   CREATININE 0.65 09/14/2023 0832   CREATININE 0.52 06/16/2023 0955   CALCIUM  9.0 09/14/2023 0832   CALCIUM  8.6 10/08/2014 1732   GFRNONAA >60 09/14/2023 0832   GFRNONAA 115 10/03/2019 1110  GFRAA >60 05/09/2020 0128   GFRAA 134 10/03/2019 1110    Lipid Panel     Component Value Date/Time   CHOL 208 (H) 09/29/2023 0915   TRIG 96 09/29/2023 0915   HDL 63 09/29/2023 0915   CHOLHDL 3.3 09/29/2023 0915   LDLCALC 125 (H) 09/29/2023 0915    CBC    Component Value Date/Time   WBC 8.4 09/14/2023 0832   RBC 4.28 09/14/2023 0832   HGB 11.1 (L) 09/14/2023 0832   HGB 11.2 04/22/2018 1054   HCT 35.0 (L) 09/14/2023 0832   HCT 33.3 (L) 04/22/2018 1054   PLT 423 (H) 09/14/2023 0832   PLT 250 04/22/2018 1054   MCV 81.8 09/14/2023 0832   MCV 88 04/22/2018  1054   MCV 88 10/08/2014 1732   MCH 25.9 (L) 09/14/2023 0832   MCHC 31.7 09/14/2023 0832   RDW 13.7 09/14/2023 0832   RDW 14.3 04/22/2018 1054   RDW 14.0 10/08/2014 1732   LYMPHSABS 2.3 09/14/2023 0832   LYMPHSABS 3.3 10/08/2014 1732   MONOABS 0.6 09/14/2023 0832   MONOABS 0.7 10/08/2014 1732   EOSABS 0.2 09/14/2023 0832   EOSABS 0.1 10/08/2014 1732   BASOSABS 0.1 09/14/2023 0832   BASOSABS 0.0 10/08/2014 1732    Hgb A1C Lab Results  Component Value Date   HGBA1C 5.4 09/29/2023           Assessment & Plan:   Preventative Health Maintenance:  Encouraged her to get a flu shot in the fall Tetanus UTD Encouraged her to get her COVID vaccine Pap smear declined (reports previous trauma) Encouraged her to consume a balanced diet and exercise regimen Advised to see an eye doctor and dentist annually We will check CBC, c-Met, lipid, A1c today  RTC in 6 months, follow-up chronic conditions Angeline Laura, NP

## 2024-04-26 ENCOUNTER — Ambulatory Visit: Payer: Self-pay | Admitting: Internal Medicine

## 2024-04-26 LAB — COMPREHENSIVE METABOLIC PANEL WITH GFR
AG Ratio: 2.1 (calc) (ref 1.0–2.5)
ALT: 14 U/L (ref 6–29)
AST: 32 U/L — ABNORMAL HIGH (ref 10–30)
Albumin: 4 g/dL (ref 3.6–5.1)
Alkaline phosphatase (APISO): 43 U/L (ref 31–125)
BUN: 17 mg/dL (ref 7–25)
CO2: 22 mmol/L (ref 20–32)
Calcium: 8.6 mg/dL (ref 8.6–10.2)
Chloride: 107 mmol/L (ref 98–110)
Creat: 0.52 mg/dL (ref 0.50–0.97)
Globulin: 1.9 g/dL (ref 1.9–3.7)
Glucose, Bld: 70 mg/dL (ref 65–99)
Potassium: 4.1 mmol/L (ref 3.5–5.3)
Sodium: 137 mmol/L (ref 135–146)
Total Bilirubin: 0.2 mg/dL (ref 0.2–1.2)
Total Protein: 5.9 g/dL — ABNORMAL LOW (ref 6.1–8.1)
eGFR: 124 mL/min/1.73m2 (ref >=60)

## 2024-04-26 LAB — CBC
HCT: 31.5 % — ABNORMAL LOW (ref 35.0–45.0)
Hemoglobin: 9.5 g/dL — ABNORMAL LOW (ref 11.7–15.5)
MCH: 24.7 pg — ABNORMAL LOW (ref 27.0–33.0)
MCHC: 30.2 g/dL — ABNORMAL LOW (ref 32.0–36.0)
MCV: 82 fL (ref 80.0–100.0)
MPV: 10.9 fL (ref 7.5–12.5)
Platelets: 321 Thousand/uL (ref 140–400)
RBC: 3.84 Million/uL (ref 3.80–5.10)
RDW: 13.2 % (ref 11.0–15.0)
WBC: 7.2 Thousand/uL (ref 3.8–10.8)

## 2024-04-26 LAB — LIPID PANEL
Cholesterol: 175 mg/dL (ref <200)
HDL: 65 mg/dL (ref >=50)
LDL Cholesterol (Calc): 96 mg/dL
Non-HDL Cholesterol (Calc): 110 mg/dL (ref <130)
Total CHOL/HDL Ratio: 2.7 (calc) (ref <5.0)
Triglycerides: 57 mg/dL (ref <150)

## 2024-04-26 LAB — HEMOGLOBIN A1C
Hgb A1c MFr Bld: 5.4 % (ref <5.7)
Mean Plasma Glucose: 108 mg/dL
eAG (mmol/L): 6 mmol/L

## 2024-05-05 ENCOUNTER — Other Ambulatory Visit (HOSPITAL_COMMUNITY): Payer: Self-pay

## 2024-05-05 ENCOUNTER — Telehealth: Payer: Self-pay

## 2024-05-05 NOTE — Telephone Encounter (Signed)
 Pharmacy Patient Advocate Encounter   Received notification from Patient Advice Request messages that prior authorization for Wegovy  0.25MG /0.5ML auto-injectors is required/requested.   Insurance verification completed.   The patient is insured through [employer] .   Per test claim: PA required; PA submitted to above mentioned insurance via CoverMyMeds Key/confirmation #/EOC [id number] Status is pending   *Pt is dual insured, [employer] already has PA approval on file, attempting PA through pt's secondary coverage

## 2024-05-05 NOTE — Telephone Encounter (Signed)
 Pharmacy Patient Advocate Encounter  Received notification from [facility] that Prior Authorization for Wegovy  0.25MG /0.5ML auto-injectors has been APPROVED from 04/05/2024 to 12/01/2024. Ran test claim, Copay is $35.00. This test claim was processed through [facility]- copay amounts may vary at other pharmacies due to pharmacy/plan contracts, or as the patient moves through the different stages of their insurance plan.   PA #/Case ID/Reference #: [id number]

## 2024-05-05 NOTE — Telephone Encounter (Signed)
 Apologies, forgot to mention that pt has 2 insurances, there has been an approval through the [employer], however, the [employer] is requiring a PA, so I am attempting a PA through [employer].

## 2024-05-05 NOTE — Telephone Encounter (Signed)
 PA request has been Submitted. New Encounter has been or will be created for follow up. For additional info see Pharmacy Prior Auth telephone encounter from 05/05/2024.

## 2024-05-05 NOTE — Telephone Encounter (Signed)
 PA has now been approved through both insurances. Should be $35.00 copay per month. Thank you

## 2024-05-31 MED ORDER — SEMAGLUTIDE-WEIGHT MANAGEMENT 0.5 MG/0.5ML ~~LOC~~ SOAJ
0.5000 mg | SUBCUTANEOUS | 0 refills | Status: DC
Start: 2024-05-31 — End: 2024-07-11

## 2024-06-11 ENCOUNTER — Other Ambulatory Visit: Payer: Self-pay

## 2024-06-11 ENCOUNTER — Emergency Department
Admission: EM | Admit: 2024-06-11 | Discharge: 2024-06-11 | Disposition: A | Discharge status: Home / Self Care | Attending: Emergency Medicine | Admitting: Emergency Medicine

## 2024-06-11 ENCOUNTER — Emergency Department

## 2024-06-11 DIAGNOSIS — R109 Unspecified abdominal pain: Secondary | ICD-10-CM

## 2024-06-11 DIAGNOSIS — R1031 Right lower quadrant pain: Secondary | ICD-10-CM | POA: Diagnosis present

## 2024-06-11 DIAGNOSIS — N390 Urinary tract infection, site not specified: Secondary | ICD-10-CM | POA: Diagnosis not present

## 2024-06-11 DIAGNOSIS — D649 Anemia, unspecified: Secondary | ICD-10-CM | POA: Diagnosis not present

## 2024-06-11 LAB — CBC
HCT: 34 % — ABNORMAL LOW (ref 36.0–46.0)
Hemoglobin: 10.5 g/dL — ABNORMAL LOW (ref 12.0–15.0)
MCH: 24.3 pg — ABNORMAL LOW (ref 26.0–34.0)
MCHC: 30.9 g/dL (ref 30.0–36.0)
MCV: 78.7 fL — ABNORMAL LOW (ref 80.0–100.0)
Platelets: 386 K/uL (ref 150–400)
RBC: 4.32 MIL/uL (ref 3.87–5.11)
RDW: 14.5 % (ref 11.5–15.5)
WBC: 7 K/uL (ref 4.0–10.5)
nRBC: 0 % (ref 0.0–0.2)

## 2024-06-11 LAB — POC URINE PREG, ED: Preg Test, Ur: NEGATIVE

## 2024-06-11 LAB — URINALYSIS, ROUTINE W REFLEX MICROSCOPIC
Bilirubin Urine: NEGATIVE
Glucose, UA: NEGATIVE mg/dL
Hgb urine dipstick: NEGATIVE
Ketones, ur: NEGATIVE mg/dL
Leukocytes,Ua: NEGATIVE
Nitrite: POSITIVE — AB
Protein, ur: 30 mg/dL — AB
RBC / HPF: 0 RBC/hpf (ref 0–5)
Specific Gravity, Urine: 1.03 (ref 1.005–1.030)
pH: 5 (ref 5.0–8.0)

## 2024-06-11 LAB — COMPREHENSIVE METABOLIC PANEL WITH GFR
ALT: 12 U/L (ref 0–44)
AST: 40 U/L (ref 15–41)
Albumin: 4.2 g/dL (ref 3.5–5.0)
Alkaline Phosphatase: 44 U/L (ref 38–126)
Anion gap: 7 (ref 5–15)
BUN: 20 mg/dL (ref 6–20)
CO2: 26 mmol/L (ref 22–32)
Calcium: 8.7 mg/dL — ABNORMAL LOW (ref 8.9–10.3)
Chloride: 105 mmol/L (ref 98–111)
Creatinine, Ser: 0.59 mg/dL (ref 0.44–1.00)
GFR, Estimated: >60 mL/min (ref >60)
Glucose, Bld: 89 mg/dL (ref 70–99)
Potassium: 3.7 mmol/L (ref 3.5–5.1)
Sodium: 138 mmol/L (ref 135–145)
Total Bilirubin: 0.4 mg/dL (ref 0.0–1.2)
Total Protein: 7.2 g/dL (ref 6.5–8.1)

## 2024-06-11 LAB — LIPASE, BLOOD: Lipase: 47 U/L (ref 11–51)

## 2024-06-11 MED ORDER — SODIUM CHLORIDE 0.9 % IV SOLN
1.0000 g | Freq: Once | INTRAVENOUS | Status: AC
  Administered 2024-06-11: 1 g via INTRAVENOUS
  Filled 2024-06-11: qty 10

## 2024-06-11 MED ORDER — CIPROFLOXACIN HCL 500 MG PO TABS: 500.0000 mg | ORAL_TABLET | Freq: Two times a day (BID) | ORAL | 0 refills | Status: AC

## 2024-06-11 MED ORDER — IOHEXOL 300 MG/ML  SOLN
100.0000 mL | Freq: Once | INTRAMUSCULAR | Status: AC | PRN
  Administered 2024-06-11: 100 mL via INTRAVENOUS

## 2024-06-11 MED ORDER — SODIUM CHLORIDE 0.9 % IV BOLUS
1000.0000 mL | Freq: Once | INTRAVENOUS | Status: AC
  Administered 2024-06-11: 1000 mL via INTRAVENOUS

## 2024-06-11 NOTE — ED Notes (Signed)
 Basics completed and urine, sent to the lab.

## 2024-06-11 NOTE — ED Triage Notes (Signed)
 Patient brought in by self for R abd pain x1 week, pain radiating into back. Pt reports increased urinary frequency and urgency x2-3 weeks. Pt reports taking Azo, which initially helped with symptoms but not anymore. AxOx4, ambulatory with steady gait.

## 2024-06-11 NOTE — ED Provider Notes (Signed)
 [facility] Provider Note    Event Date/Time   First MD Initiated Contact with Patient 06/11/24 [phone]     (approximate)   History   Abdominal Pain   HPI  Sonya Spencer is a 38 y.o. female history of anemia and multiple pregnancy related past medical history concerns see past medical history, presents emergency department with right lower quadrant pain.  Patient states that she was concerned she might have a UTI few days ago.  States had some urgency ongoing for 2 to 3 weeks.  Took Azo which initially helped but is not helping anymore.  Denies known fever but has had feelings of being hot and cold.  States right lower quadrant does hurt when she is walking.  No decrease in her appetite.  No vomiting or diarrhea.      Physical Exam   Triage Vital Signs: ED Triage Vitals  Encounter Vitals Group     BP 06/11/24 0337 (!) 126/54     Girls Systolic BP Percentile --      Girls Diastolic BP Percentile --      Boys Systolic BP Percentile --      Boys Diastolic BP Percentile --      Pulse Rate 06/11/24 0337 84     Resp 06/11/24 0337 16     Temp 06/11/24 0337 98.4 F (36.9 C)     Temp Source 06/11/24 0739 Oral     SpO2 06/11/24 0337 100 %     Weight 06/11/24 0337 153 lb (69.4 kg)     Height 06/11/24 0337 5' 2 (1.575 m)     Head Circumference --      Peak Flow --      Pain Score 06/11/24 0354 7     Pain Loc --      Pain Education --      Exclude from Growth Chart --     Most recent vital signs: Vitals:   06/11/24 0337 06/11/24 0739  BP: (!) 126/54 117/68  Pulse: 84 76  Resp: 16 18  Temp: 98.4 F (36.9 C) 98.2 F (36.8 C)  SpO2: 100% 100%     General: Awake, no distress.   CV:  Good peripheral perfusion. regular rate and  rhythm Resp:  Normal effort. Lungs cta Abd:  No distention.  Mildly tender right lower quadrant, no CVA tenderness noted Other:      ED Results / Procedures / Treatments   Labs (all labs ordered are listed, but only  abnormal results are displayed) Labs Reviewed  COMPREHENSIVE METABOLIC PANEL WITH GFR - Abnormal; Notable for the following components:      Result Value   Calcium  8.7 (*)    All other components within normal limits  CBC - Abnormal; Notable for the following components:   Hemoglobin 10.5 (*)    HCT 34.0 (*)    MCV 78.7 (*)    MCH 24.3 (*)    All other components within normal limits  URINALYSIS, ROUTINE W REFLEX MICROSCOPIC - Abnormal; Notable for the following components:   Color, Urine Classie (*)    APPearance CLEAR (*)    Protein, ur 30 (*)    Nitrite POSITIVE (*)    Bacteria, UA RARE (*)    All other components within normal limits  URINE CULTURE  LIPASE, BLOOD  POC URINE PREG, ED     EKG     RADIOLOGY CT abdomen pelvis IV contrast    PROCEDURES:   Procedures  Critical Care:  no Chief Complaint  Patient presents with   Abdominal Pain      MEDICATIONS ORDERED IN ED: Medications  cefTRIAXone  (ROCEPHIN ) 1 g in sodium chloride  0.9 % 100 mL IVPB (1 g Intravenous New Bag/Given 06/11/24 0949)  sodium chloride  0.9 % bolus 1,000 mL (0 mLs Intravenous Stopped 06/11/24 0912)  iohexol  (OMNIPAQUE ) 300 MG/ML solution 100 mL (100 mLs Intravenous Contrast Given 06/11/24 0850)     IMPRESSION / MDM / ASSESSMENT AND PLAN / ED COURSE  I reviewed the triage vital signs and the nursing notes.                              Differential diagnosis includes, but is not limited to, acute appendicitis, ovarian cyst, pyelonephritis, UTI, kidney stone  Patient's presentation is most consistent with acute illness / injury with system symptoms.    Medications given: Rocephin  1 g IV  The patient CBC shows anemia, I did review her past medical charts indicating anemia.  Her values appear to be in the normal trend of her hemoglobin levels.  Comprehensive panel, lipase, reassuring, POC pregnancy negative, urinalysis does show some nitrites and rare bacteria but no increase in her  white cell count.  Therefore we will go ahead with CT abdomen pelvis IV contrast for the right lower quadrant pain.  CT abdomen pelvis IV contrast does not show acute appendicitis, pyelonephritis.  At this time I do not see any other red flags.  She does have some chronic dilation noted for the gallbladder.  Patient states she has had multiple surgeries concerning this.  Do not feel this is an acute problem at this time.  Rocephin  1 g IV was given.  Patient was placed on Cipro .  I did try to order Keflex/Omnicef or other cephalosporin p.o. but she is allergic to red dye which is in these medications.  She is to follow-up with her regular doctor.  Urine culture added.  Discharged stable condition.     FINAL CLINICAL IMPRESSION(S) / ED DIAGNOSES   Final diagnoses:  Acute abdominal pain  Acute UTI     Rx / DC Orders   ED Discharge Orders          Ordered    ciprofloxacin  (CIPRO ) 500 MG tablet  2 times daily        06/11/24 9061             Note:  This document was prepared using Dragon voice recognition software and may include unintentional dictation errors.    Gasper Devere ORN, PA-C 06/11/24 9048    Jacolyn Pae, MD 06/11/24 [phone]

## 2024-06-11 NOTE — Discharge Instructions (Signed)
 Follow-up with your regular doctor if not improving in 2 to 3 days.  Return emergency department worsening.  Take antibiotics as prescribed.  You will not need to take an antibiotic until later today.

## 2024-06-12 LAB — URINE CULTURE: Culture: NO GROWTH

## 2024-07-09 ENCOUNTER — Encounter: Payer: Self-pay | Admitting: Internal Medicine

## 2024-07-11 MED ORDER — SEMAGLUTIDE-WEIGHT MANAGEMENT 1 MG/0.5ML ~~LOC~~ SOAJ: 1.0000 mg | SUBCUTANEOUS | 0 refills | Status: AC

## 2024-07-25 ENCOUNTER — Telehealth: Payer: Self-pay | Admitting: Pharmacy Technician

## 2024-07-25 NOTE — Telephone Encounter (Signed)
 Pharmacy Patient Advocate Encounter   Received notification from Onbase that prior authorization for Zepbound  2.5MG /0.5ML pen-injectors is due for renewal.   Insurance verification completed.   The patient is insured through [facility].  Action: Medication has been discontinued. Archived Key: [id number]  Patient therapy has been changed to Wegovy .

## 2024-08-01 ENCOUNTER — Telehealth: Admitting: Physician Assistant

## 2024-08-01 ENCOUNTER — Telehealth (INDEPENDENT_AMBULATORY_CARE_PROVIDER_SITE_OTHER): Admitting: Internal Medicine

## 2024-08-01 ENCOUNTER — Encounter: Payer: Self-pay | Admitting: Internal Medicine

## 2024-08-01 DIAGNOSIS — R112 Nausea with vomiting, unspecified: Secondary | ICD-10-CM | POA: Diagnosis not present

## 2024-08-01 DIAGNOSIS — R197 Diarrhea, unspecified: Secondary | ICD-10-CM | POA: Diagnosis not present

## 2024-08-01 DIAGNOSIS — K219 Gastro-esophageal reflux disease without esophagitis: Secondary | ICD-10-CM

## 2024-08-01 MED ORDER — ONDANSETRON HCL 4 MG PO TABS: 4.0000 mg | ORAL_TABLET | Freq: Three times a day (TID) | ORAL | 0 refills | Status: AC | PRN

## 2024-08-01 NOTE — Progress Notes (Signed)
 E-Visit for Nausea and Vomiting   We are sorry that you are not feeling well. Here is how we plan to help! Of note, doctors notes are only provided at the day of the visit.   Based on what you have shared with me it looks like you have a Virus that is irritating your GI tract.  Vomiting is the forceful emptying of a portion of the stomach's content through the mouth.  Although nausea and vomiting can make you feel miserable, it's important to remember that these are not diseases, but rather symptoms of an underlying illness.  When we treat short term symptoms, we always caution that any symptoms that persist should be fully evaluated in a medical office.  I have prescribed a medication that will help alleviate your symptoms and allow you to stay hydrated:  Zofran  4 mg 1 tablet every 8 hours as needed for nausea and vomiting  HOME CARE: Drink clear liquids.  This is very important! Dehydration (the lack of fluid) can lead to a serious complication.  Start off with 1 tablespoon every 5 minutes for 8 hours. You may begin eating bland foods after 8 hours without vomiting.  Start with saltine crackers, white bread, rice, mashed potatoes, applesauce. After 48 hours on a bland diet, you may resume a normal diet. Try to go to sleep.  Sleep often empties the stomach and relieves the need to vomit.  GET HELP RIGHT AWAY IF:  Your symptoms do not improve or worsen within 2 days after treatment. You have a fever for over 3 days. You cannot keep down fluids after trying the medication.  MAKE SURE YOU:  Understand these instructions. Will watch your condition. Will get help right away if you are not doing well or get worse.    Thank you for choosing an e-visit.  Your e-visit answers were reviewed by a board certified advanced clinical practitioner to complete your personal care plan. Depending upon the condition, your plan could have included both over the counter or prescription  medications.  Please review your pharmacy choice. Make sure the pharmacy is open so you can pick up prescription now. If there is a problem, you may contact your provider through [employer] and have the prescription routed to another pharmacy.  Your safety is important to us . If you have drug allergies check your prescription carefully.   For the next 24 hours you can use MyChart to ask questions about today's visit, request a non-urgent call back, or ask for a work or school excuse. You will get an email in the next two days asking about your experience. I hope that your e-visit has been valuable and will speed your recovery.  I have spent 5 minutes in review of e-visit questionnaire, review and updating patient chart, medical decision making and response to patient.   Teena Shuck, PA-C

## 2024-08-01 NOTE — Patient Instructions (Signed)
 Vomiting, Adult Vomiting is when stomach contents forcefully come out of the mouth. Many people notice nausea before vomiting. Vomiting can make you feel weak and cause you to become dehydrated. Dehydration can make you feel tired and thirsty, cause you to have a dry mouth, and decrease how often you urinate. Older adults and people who have other diseases or a weak body defense system (immune system) are at higher risk for dehydration. It is important to treat vomiting as told by your health care provider. Follow these instructions at home:  Watch your symptoms for any changes. Tell your health care provider about them. Eating and drinking     Follow these recommendations as told by your health care provider: Take an oral rehydration solution (ORS). This is a drink that is sold at pharmacies and retail stores. Eat bland, easy-to-digest foods in small amounts as you are able. These foods include bananas, applesauce, rice, lean meats, toast, and crackers. Drink clear fluids slowly and in small amounts as you are able. Clear fluids include water, ice chips, low-calorie sports drinks, and fruit juice that has water added (diluted fruit juice). Avoid drinking fluids that contain a lot of sugar or caffeine, such as energy drinks, sports drinks, and soda. Avoid alcohol. Avoid spicy or fatty foods.  General instructions Wash your hands often using soap and water for at least 20 seconds. If soap and water are not available, use hand sanitizer. Make sure that everyone in your household washes their hands frequently. Take over-the-counter and prescription medicines only as told by your health care provider. Rest at home while you recover. Watch your condition for any changes. Keep all follow-up visits. This is important. Contact a health care provider if: Your vomiting gets worse. You have new symptoms. You have a fever. You cannot drink fluids without vomiting. You feel light-headed or  dizzy. You have a headache. You have muscle cramps. You have a rash. You have pain while urinating. Get help right away if: You have pain in your chest, neck, arm, or jaw. Your heart is beating very quickly. You have trouble breathing or you are breathing very quickly. You feel extremely weak or you faint. Your skin feels cold and clammy. You feel confused. You have persistent vomiting. You have vomit that is bright red or looks like black coffee grounds. You have stools (feces) that are bloody or black, or stools that look like tar. You have a severe headache, a stiff neck, or both. You have severe pain, cramping, or bloating in your abdomen. You have signs of dehydration, such as: Dark urine, very little urine, or no urine. Cracked lips. Dry mouth. Sunken eyes. Sleepiness. Weakness. These symptoms may be an emergency. Get help right away. Call 911. Do not wait to see if the symptoms will go away. Do not drive yourself to the hospital. Summary Vomiting is when stomach contents forcefully come out of the mouth. Vomiting can cause you to become dehydrated. It is important to treat vomiting as told by your health care provider. Follow your health care provider's instructions about eating and drinking. Wash your hands often using soap and water for at least 20 seconds. If soap and water are not available, use hand sanitizer. Watch your condition for any changes and for signs of dehydration. Keep all follow-up visits. This is important. This information is not intended to replace advice given to you by your health care provider. Make sure you discuss any questions you have with your health care provider.  Document Revised: 03/22/2021 Document Reviewed: 03/22/2021 Elsevier Patient Education  2024 [employer].

## 2024-08-01 NOTE — Progress Notes (Signed)
 Virtual Visit via Video Note  I connected with Sonya Spencer on 08/01/24 at  4:00 PM EST by a video enabled telemedicine application and verified that I am speaking with the correct person using two identifiers.  Location: Patient: In her car Provider: Office  Person's participating in this video call: Angeline Laura, NP-C and Sonya Spencer   I discussed the limitations of evaluation and management by telemedicine and the availability of in person appointments. The patient expressed understanding and agreed to proceed.  History of Present Illness:   Discussed the use of AI scribe software for clinical note transcription with the patient, who gave verbal consent to proceed.  Sonya Spencer is a 38 year old female with GERD who presents with nausea, vomiting and diarrhea after eating a greasy meal.  She experienced sudden onset of severe nausea and vomiting shortly after consuming a double cheeseburger, which she described as excessively greasy. The episode began last night around 7 or 8 PM after she returned home from work, and she vomited throughout the night.  Symptoms began approximately 10 to 15 minutes after eating, and she suspects that the greasy meal may have exacerbated her GERD. She does not believe Wegovy  contributed to her symptoms.  No fever or abdominal pain was reported.      Past Medical History:  Diagnosis Date   Abnormal glucose tolerance test (GTT) during pregnancy, antepartum 12/23/2017   - pt check BG log.   - Lifestyles referral for presumptive GDM (also due to inordinate weight loss early in pregnancy) - will treat as GDM.  Patient has refused 3 hour gtt.     Allergy    Anemia    with pregnancy only   Anxiety    Asthma    only with pregnancy   Cholelithiasis    Complication of anesthesia    panic attack with first panic   Depression    Fetal macrosomia during pregnancy in third trimester 03/25/2015   Resolved with delivery    Frequent headaches     migraines with pregnancy   GERD (gastroesophageal reflux disease)    Gestational diabetes    Gestational diabetes mellitus (GDM) affecting third pregnancy 02/05/2018   H/O maternal third degree perineal laceration, currently pregnant    History of Papanicolaou smear of cervix 10/10/11; 08/02/14   neg, ct neg; neg ct//gc/tr neg   PONV (postoperative nausea and vomiting)    Supervision of normal pregnancy 11/09/2017   [facility] Prenatal Labs Dating  Blood type:    Genetic Screen 1 Screen:    AFP:     Quad:     NIPS: Antibody:  Anatomic US   Rubella:   Varicella:   GTT Early:               Third trimester:  RPR:    Rhogam  HBsAg:    TDaP vaccine                       Flu Shot: HIV:    Baby Food                                GBS:  Contraception  Pap: CBB    CS/VBAC   Support Person         Urinary incontinence     Current Outpatient Medications  Medication Sig Dispense Refill   albuterol  (VENTOLIN  HFA) 108 (90 Base) MCG/ACT inhaler  TAKE TWO PUFFS BY MOUTH EVERY SIX HOURS AS NEEDED FOR WHEEZE (Patient taking differently: Inhale 2 puffs into the lungs every 4 (four) hours as needed for wheezing or shortness of breath. TAKE TWO PUFFS BY MOUTH EVERY SIX HOURS AS NEEDED FOR WHEEZE) 8.5 g 0   buPROPion  (WELLBUTRIN  XL) 150 MG 24 hr tablet Take 2 tablets (300 mg total) by mouth daily. 180 tablet 1   cetirizine  (ZYRTEC ) 10 MG tablet Take 1 tablet (10 mg total) by mouth every evening. 90 tablet 3   cyclobenzaprine  (FLEXERIL ) 5 MG tablet Take 1 tablet (5 mg total) by mouth at bedtime. 15 tablet 0   escitalopram  (LEXAPRO ) 20 MG tablet Take 1 tablet (20 mg total) by mouth daily. 90 tablet 1   ibuprofen  (ADVIL ) 800 MG tablet Take 1 tablet (800 mg total) by mouth every 8 (eight) hours as needed (pain). 30 tablet 0   omeprazole  (PRILOSEC) 20 MG capsule Take 1 capsule (20 mg total) by mouth 2 (two) times daily before a meal. 180 capsule 1   ondansetron  (ZOFRAN ) 4 MG tablet Take 1 tablet (4 mg total) by mouth  every 8 (eight) hours as needed for nausea or vomiting. 20 tablet 0   semaglutide -weight management (WEGOVY ) 1 MG/0.5ML SOAJ SQ injection Inject 1 mg into the skin once a week. 6 mL 0   No current facility-administered medications for this visit.    Allergies  Allergen Reactions   Oxycodone  Other (See Comments)    migraines   Nickel Rash   Red Dye #40 (Allura Red) Rash    Includes Benadryl  as it has red dye in it.    Family History  Problem Relation Age of Onset   Lupus Mother    Hepatitis C Mother    Depression Mother    Diabetes type I Father    Thyroid disease Father    Lupus Sister    Heart attack Sister    CVA Sister    Lupus Maternal Grandmother    Diabetes Maternal Grandmother    Diabetes Paternal Grandfather        type 1   Diabetes type I Daughter    Diabetes Daughter    Lupus Maternal Aunt    Diabetes Paternal Aunt        type 1 - runs in father's side    Social History   Socioeconomic History   Marital status: Legally Separated    Spouse name: Not on file   Number of children: 3   Years of education: 14   Highest education level: Some college, no degree  Occupational History   Occupation: HOMEMAKER  Tobacco Use   Smoking status: Former    Current packs/day: 0.00    Average packs/day: 0.3 packs/day for 2.0 years (0.5 ttl pk-yrs)    Types: Cigarettes    Start date: 03/14/2007    Quit date: 03/13/2009    Years since quitting: 15.3   Smokeless tobacco: Never  Vaping Use   Vaping status: Every Day   Substances: THC, CBD  Substance and Sexual Activity   Alcohol use: Not Currently    Alcohol/week: 3.0 standard drinks of alcohol    Types: 3 Shots of liquor per week   Drug use: No    Comment: THC pens   Sexual activity: Yes    Birth control/protection: Surgical    Comment: Tubal Ligation  Other Topics Concern   Not on file  Social History Narrative   Not on file   Social Drivers  of Health   Financial Resource Strain: Medium Risk (04/24/2024)    Overall Financial Resource Strain (CARDIA)    Difficulty of Paying Living Expenses: Somewhat hard  Food Insecurity: Food Insecurity Present (04/24/2024)   Hunger Vital Sign    Worried About Running Out of Food in the Last Year: Sometimes true    Ran Out of Food in the Last Year: Sometimes true  Transportation Needs: No Transportation Needs (04/24/2024)   PRAPARE - Administrator, Civil Service (Medical): No    Lack of Transportation (Non-Medical): No  Physical Activity: Insufficiently Active (04/24/2024)   Exercise Vital Sign    Days of Exercise per Week: 4 days    Minutes of Exercise per Session: 30 min  Stress: Stress Concern Present (04/24/2024)   [employer] of Occupational Health - Occupational Stress Questionnaire    Feeling of Stress: To some extent  Social Connections: Socially Isolated (04/24/2024)   Social Connection and Isolation Panel    Frequency of Communication with Friends and Family: Twice a week    Frequency of Social Gatherings with Friends and Family: More than three times a week    Attends Religious Services: Never    Database Administrator or Organizations: No    Attends Engineer, Structural: Not on file    Marital Status: Separated  Intimate Partner Violence: Not on file     Constitutional: Denies fever, malaise, fatigue, headache or abrupt weight changes.  Respiratory: Denies difficulty breathing, shortness of breath, cough or sputum production.   Cardiovascular: Denies chest pain, chest tightness, palpitations or swelling in the hands or feet.  Gastrointestinal: Pt reports nausea,vomiting or diarrhea. Denies abdominal pain, bloating, constipation, or blood in the stool.   No other specific complaints in a complete review of systems (except as listed in HPI above).  Observations/Objective:  Wt Readings from Last 3 Encounters:  06/11/24 153 lb (69.4 kg)  04/25/24 156 lb 12.8 oz (71.1 kg)  12/23/23 140 lb (63.5 kg)    General:  Appears her stated age, well developed, well nourished in NAD. Pulmonary/Chest: Normal effort. No respiratory distress.  Neurological: Alert and oriented.  BMET    Component Value Date/Time   NA 138 06/11/2024 0338   NA 138 10/08/2014 1732   K 3.7 06/11/2024 0338   K 3.6 10/08/2014 1732   CL 105 06/11/2024 0338   CL 106 10/08/2014 1732   CO2 26 06/11/2024 0338   CO2 23 10/08/2014 1732   GLUCOSE 89 06/11/2024 0338   GLUCOSE 70 10/08/2014 1732   BUN 20 06/11/2024 0338   BUN 5 (L) 10/08/2014 1732   CREATININE 0.59 06/11/2024 0338   CREATININE 0.52 04/25/2024 1013   CALCIUM  8.7 (L) 06/11/2024 0338   CALCIUM  8.6 10/08/2014 1732   GFRNONAA >60 06/11/2024 0338   GFRNONAA 115 10/03/2019 1110   GFRAA >60 05/09/2020 0128   GFRAA 134 10/03/2019 1110    Lipid Panel     Component Value Date/Time   CHOL 175 04/25/2024 1013   TRIG 57 04/25/2024 1013   HDL 65 04/25/2024 1013   CHOLHDL 2.7 04/25/2024 1013   LDLCALC 96 04/25/2024 1013    CBC    Component Value Date/Time   WBC 7.0 06/11/2024 0338   RBC 4.32 06/11/2024 0338   HGB 10.5 (L) 06/11/2024 0338   HGB 11.2 04/22/2018 1054   HCT 34.0 (L) 06/11/2024 0338   HCT 33.3 (L) 04/22/2018 1054   PLT 386 06/11/2024 0338   PLT 250  04/22/2018 1054   MCV 78.7 (L) 06/11/2024 0338   MCV 88 04/22/2018 1054   MCV 88 10/08/2014 1732   MCH 24.3 (L) 06/11/2024 0338   MCHC 30.9 06/11/2024 0338   RDW 14.5 06/11/2024 0338   RDW 14.3 04/22/2018 1054   RDW 14.0 10/08/2014 1732   LYMPHSABS 2.3 09/14/2023 0832   LYMPHSABS 3.3 10/08/2014 1732   MONOABS 0.6 09/14/2023 0832   MONOABS 0.7 10/08/2014 1732   EOSABS 0.2 09/14/2023 0832   EOSABS 0.1 10/08/2014 1732   BASOSABS 0.1 09/14/2023 0832   BASOSABS 0.0 10/08/2014 1732    Hgb A1C Lab Results  Component Value Date   HGBA1C 5.4 04/25/2024       Assessment and Plan:  Assessment and Plan    Acute nausea, vomiting and diarrhea Likely exacerbation of GERD post greasy meal. No Wegovy   involvement. - Uploaded work excuse note to [employer] for November 2nd.      RTC in 2 months for follow-up of chronic conditions  Follow Up Instructions:    I discussed the assessment and treatment plan with the patient. The patient was provided an opportunity to ask questions and all were answered. The patient agreed with the plan and demonstrated an understanding of the instructions.   The patient was advised to call back or seek an in-person evaluation if the symptoms worsen or if the condition fails to improve as anticipated.   Angeline Laura, NP

## 2024-08-03 ENCOUNTER — Telehealth

## 2024-08-03 DIAGNOSIS — R3989 Other symptoms and signs involving the genitourinary system: Secondary | ICD-10-CM

## 2024-08-04 MED ORDER — SULFAMETHOXAZOLE-TRIMETHOPRIM 800-160 MG PO TABS: 1.0000 | ORAL_TABLET | Freq: Two times a day (BID) | ORAL | 0 refills | Status: DC

## 2024-08-04 NOTE — Progress Notes (Signed)

## 2024-08-16 ENCOUNTER — Telehealth: Admitting: Emergency Medicine

## 2024-08-16 DIAGNOSIS — R079 Chest pain, unspecified: Secondary | ICD-10-CM

## 2024-08-16 NOTE — Progress Notes (Signed)
 Based on what you shared with me, I feel your condition warrants further evaluation as soon as possible at an Emergency department.   Your symptoms are unusual, and I'm concerned about how severe they are.    NOTE: There will be NO CHARGE for this eVisit   If you are having a true medical emergency please call 911.      Emergency Department-[facility]  Get Driving Directions  [phone]  [address]  [place], [place] [address]  Open 24/7/365      [facility] Emergency Department at [facility]  Get Driving Directions  [address]  [place], [place] [address]  Open 24/7/365    Emergency Department- [facility]  Get Driving Directions  [phone]  [address]  [place], [place] [address]  Open 24/7/365      [facility]  Get Driving Directions  [phone]  [address]  [place], [place] [address]  Open 24/7/365    [facility]  Emergency Department- [facility]  Get Driving Directions  [phone]  [address]  [place], [place] [address]  Open 24/7/365    [place]  Emergency Department- [facility]  Get Driving Directions  [address]  [place], [place] [address]  Open 24/7/365    [facility]  Emergency Department- [facility]  Get Driving Directions  [phone]  [address]  [place], [place] [address]  Open 24/7/365

## 2024-08-17 ENCOUNTER — Telehealth: Payer: Self-pay

## 2024-08-17 ENCOUNTER — Telehealth: Admitting: Physician Assistant

## 2024-08-17 ENCOUNTER — Other Ambulatory Visit (HOSPITAL_COMMUNITY): Payer: Self-pay

## 2024-08-17 DIAGNOSIS — R197 Diarrhea, unspecified: Secondary | ICD-10-CM

## 2024-08-17 NOTE — Telephone Encounter (Signed)
 Pharmacy Patient Advocate Encounter   Received notification from Onbase that prior authorization for Wegovy  1 mg/0.5 ml auto injectors is required/requested.   Insurance verification completed.   The patient is insured through [facility].   Per test claim: PA required; PA started via CoverMyMeds. KEY [id number] . Please see clinical question(s) below that I am not finding the answer to in their chart and advise.    *we need an updated weight within the last 45 days with documentation that I can send to the insurance company

## 2024-08-17 NOTE — Progress Notes (Signed)
  Because of frequent diarrhea and recently being on antibiotics, I feel your condition warrants further evaluation and I recommend that you be seen in a face-to-face visit. You need to be checked for an infection that can be caused by antibiotics, called C. Difficile (C. Diff).    NOTE: There will be NO CHARGE for this E-Visit   If you are having a true medical emergency, please call 911.     For an urgent face to face visit, [place] has multiple urgent care centers for your convenience.  Click the link below for the full list of locations and hours, walk-in wait times, appointment scheduling options and driving directions:  Urgent Care - [place], [place], [place], [place], [place], [place]  [place]     Your MyChart E-visit questionnaire answers were reviewed by a board certified advanced clinical practitioner to complete your personal care plan based on your specific symptoms.    Thank you for using e-Visits.

## 2024-09-02 ENCOUNTER — Other Ambulatory Visit (HOSPITAL_COMMUNITY): Payer: Self-pay

## 2024-09-05 ENCOUNTER — Other Ambulatory Visit (HOSPITAL_COMMUNITY): Payer: Self-pay

## 2024-09-16 ENCOUNTER — Telehealth

## 2024-09-16 DIAGNOSIS — M545 Low back pain, unspecified: Secondary | ICD-10-CM | POA: Diagnosis not present

## 2024-09-17 MED ORDER — CYCLOBENZAPRINE HCL 10 MG PO TABS: 10.0000 mg | ORAL_TABLET | Freq: Three times a day (TID) | ORAL | 0 refills | Status: AC | PRN

## 2024-09-17 MED ORDER — NAPROXEN 500 MG PO TABS: 500.0000 mg | ORAL_TABLET | Freq: Two times a day (BID) | ORAL | 0 refills | Status: DC

## 2024-09-17 MED ORDER — IBUPROFEN 600 MG PO TABS: 600.0000 mg | ORAL_TABLET | Freq: Three times a day (TID) | ORAL | 0 refills | Status: AC | PRN

## 2024-09-17 NOTE — Addendum Note (Signed)
 Addended byBETHA KINGSTON ROBES on: 09/17/2024 08:28 AM   Modules accepted: Orders

## 2024-09-17 NOTE — Progress Notes (Signed)
 We are sorry that you are not feeling well.  Here is how we plan to help!  Based on what you have shared with me it, appears you may be experiencing acute back pain.   Acute back pain is defined as musculoskeletal pain that can resolve in 1-3 weeks with conservative treatment.  I have prescribed a non-steroid anti-inflammatory (NSAID) Naprosyn  500 mg -- take one by mouth twice a day, as well as a muscle relaxant, Flexeril  10 mg every eight hours as needed. Some patients experience stomach irritation or in increased heartburn with anti-inflammatory drugs.  Please keep in mind that muscle relaxer's can cause fatigue and should not be taken while at work or driving.  Back pain is very common.  The pain often gets better over time.  The cause of back pain is usually not dangerous.  Most people can learn to manage their back pain on their own. Please do not take Naprosyn  if taking Ibuprofen .  Please do not take together can cause upper gastrointestinal bleeding.   Home Care Stay active.  Start with short walks on flat ground if you can.  Try to walk farther each day. Do not sit, drive or stand in one place for more than 30 minutes.  Do not stay in bed. Do not fully avoid exercise or work.  Activity can help your back heal faster. Be careful when you bend or lift an object.  Bend at your knees, keep the object close to you, and do not twist. Sleep on a firm mattress.  Lie on your side, and bend your knees.  If you lie on your back, put a pillow under your knees. Only take medicines as told by your doctor. Put ice on the injured area. Put ice in a plastic bag Place a towel between your skin and the bag Leave the ice on for 15-20 minutes, 3-4 times a day for the first 2-3 days.  After that, you can switch between ice and heat packs. Ask your doctor about back exercises or massage.   Get Help Right Way If: Your pain does not go away with rest and treatments given today. Your pain does not go away  within 1 week. You have new problems. You do not feel well. The pain spreads into your legs. You cannot control when you poop (bowel movement) or pee (urinate). You feel sick to your stomach (nauseous) or throw up (vomit). You have belly (abdominal) pain. You feel like you may pass out (faint). If you develop a fever.  Make Sure you: Understand these instructions. Continue to monitor your condition for any changes. Will get help right away if you are not doing well or get worse.  Your e-visit answers were reviewed by a board certified advanced clinical practitioner to complete your personal care plan.  Depending on the condition, your plan could have included both over the counter or prescription medications.  If there is a problem, please reply once you have received a response from your provider.  Your safety is important to us .  If you have drug allergies, check your prescription carefully.    You can use MyChart to ask questions about todays visit, request a non-urgent call back, or ask for a work or school excuse for 24 hours related to this e-Visit. If it has been greater than 24 hours you will need to follow up with your provider or enter a new e-Visit to address those concerns.  You will get an e-mail in the  next two days asking about your experience.  I hope that your e-visit has been valuable and will speed your recovery. Thank you for using e-visits.   I have spent 5 minutes in review of e-visit questionnaire, review and updating patient chart, medical decision making and response to patient.   Roosvelt Mater, PA-C

## 2024-09-17 NOTE — Progress Notes (Signed)
-  Pt requests a different Nsaid for back pain.

## 2024-10-19 ENCOUNTER — Encounter: Payer: Self-pay | Admitting: Internal Medicine

## 2024-10-19 ENCOUNTER — Ambulatory Visit
Admission: RE | Admit: 2024-10-19 | Discharge: 2024-10-19 | Disposition: A | Discharge status: Home / Self Care | Source: Ambulatory Visit | Attending: Internal Medicine | Admitting: Internal Medicine

## 2024-10-19 ENCOUNTER — Ambulatory Visit: Admitting: Internal Medicine

## 2024-10-19 VITALS — BP 122/72 | Ht 62.0 in | Wt 171.0 lb

## 2024-10-19 DIAGNOSIS — E66811 Obesity, class 1: Secondary | ICD-10-CM | POA: Diagnosis not present

## 2024-10-19 DIAGNOSIS — E6609 Other obesity due to excess calories: Secondary | ICD-10-CM

## 2024-10-19 DIAGNOSIS — M25511 Pain in right shoulder: Secondary | ICD-10-CM

## 2024-10-19 DIAGNOSIS — R519 Headache, unspecified: Secondary | ICD-10-CM

## 2024-10-19 DIAGNOSIS — R739 Hyperglycemia, unspecified: Secondary | ICD-10-CM | POA: Diagnosis not present

## 2024-10-19 DIAGNOSIS — G8929 Other chronic pain: Secondary | ICD-10-CM

## 2024-10-19 DIAGNOSIS — M546 Pain in thoracic spine: Secondary | ICD-10-CM | POA: Insufficient documentation

## 2024-10-19 DIAGNOSIS — F331 Major depressive disorder, recurrent, moderate: Secondary | ICD-10-CM

## 2024-10-19 DIAGNOSIS — D500 Iron deficiency anemia secondary to blood loss (chronic): Secondary | ICD-10-CM | POA: Diagnosis not present

## 2024-10-19 DIAGNOSIS — F411 Generalized anxiety disorder: Secondary | ICD-10-CM

## 2024-10-19 DIAGNOSIS — E782 Mixed hyperlipidemia: Secondary | ICD-10-CM | POA: Diagnosis not present

## 2024-10-19 DIAGNOSIS — K219 Gastro-esophageal reflux disease without esophagitis: Secondary | ICD-10-CM

## 2024-10-19 MED ORDER — WEGOVY 1.7 MG/0.75ML ~~LOC~~ SOAJ: 1.7000 mg | SUBCUTANEOUS | 0 refills | Status: AC

## 2024-10-19 MED ORDER — CYCLOBENZAPRINE HCL 5 MG PO TABS: 5.0000 mg | ORAL_TABLET | Freq: Every day | ORAL | 0 refills | Status: AC

## 2024-10-19 MED ORDER — BUSPIRONE HCL 7.5 MG PO TABS: 7.5000 mg | ORAL_TABLET | Freq: Two times a day (BID) | ORAL | 1 refills | Status: AC

## 2024-10-19 MED ORDER — ALBUTEROL SULFATE HFA 108 (90 BASE) MCG/ACT IN AERS: INHALATION_SPRAY | RESPIRATORY_TRACT | 0 refills | Status: AC

## 2024-10-19 MED ORDER — TRAZODONE HCL 50 MG PO TABS: 25.0000 mg | ORAL_TABLET | Freq: Every evening | ORAL | 1 refills | Status: AC | PRN

## 2024-10-19 NOTE — Assessment & Plan Note (Signed)
 Complicated by obesity C-Met and lipid profile today Encouraged her to consume low-fat diet

## 2024-10-19 NOTE — Assessment & Plan Note (Signed)
 Encourage high-protein, low-carb diet and exercise for weight loss Will increase wegovy  to 1.7 mg weekly

## 2024-10-19 NOTE — Assessment & Plan Note (Signed)
 Continue ibuprofen  and cyclobenzaprine  5 mg daily as needed Will continue to follow with orthopedics

## 2024-10-19 NOTE — Assessment & Plan Note (Signed)
 Complicated by obesity Encouraged regular stretching X-ray thoracic spine today Did discuss possible referral to PT pending x-ray results Okay to continue tylenol  and ibuprofen  OTC Continue cyclobenzaprine  5 mg daily as needed

## 2024-10-19 NOTE — Assessment & Plan Note (Signed)
 CBC and iron panel today We will monitor

## 2024-10-19 NOTE — Assessment & Plan Note (Signed)
 Complicated by obesity Try to identify and avoid foods that trigger reflux Encourage weight loss as this can help reduce reflux symptoms Continue omeprazole  20 mg daily

## 2024-10-19 NOTE — Patient Instructions (Signed)
 Thoracic Strain Rehab Ask your health care provider which exercises are safe for you. Do exercises exactly as told by your provider and adjust them as directed. It is normal to feel mild stretching, pulling, tightness, or discomfort as you do these exercises. Stop right away if you feel sudden pain or your pain gets worse. Do not begin these exercises until told by your provider. Stretching and range-of-motion exercise This exercise warms up your muscles and joints and improves the movement and flexibility of your back and shoulders. This exercise also helps to relieve pain. Chest and spine stretch  Lie down on your back on a firm surface. Roll a towel or a small blanket so it is about 4 inches (10 cm) in diameter. Put the towel under the middle of your back so it is under your spine, but not under your shoulder blades. Put your hands behind your head and let your elbows fall to your sides. This will increase your stretch. Take a deep breath (inhale). Hold for __________ seconds. Relax after you breathe out (exhale). Repeat __________ times. Complete this exercise __________ times a day. Strengthening exercises These exercises build strength and endurance in your back and your shoulder blade muscles. Endurance is the ability to use your muscles for a long time, even after they get tired. Alternating arm and leg raises  Get on your hands and knees on a firm surface. If you are on a hard floor, you may want to use padding, such as an exercise mat, to cushion your knees. Line up your arms and legs. Your hands should be directly below your shoulders, and your knees should be directly below your hips. Lift your left leg behind you. At the same time, raise your right arm and straighten it in front of you. Do not lift your leg higher than your hip. Do not lift your arm higher than your shoulder. Keep your abdominal and back muscles tight. Keep your hips facing the ground. Do not arch your  back. Carefully stay balanced. Do not hold your breath. Hold for __________ seconds. Slowly return to the starting position and repeat with your right leg and your left arm. Repeat __________ times. Complete this exercise __________ times a day. Straight arm rows This exercise is also called the shoulder extension exercise. Stand with your feet shoulder width apart. Secure an exercise band to a stable object in front of you so the band is at or above shoulder height. Hold one end of the exercise band in each hand. Straighten your elbows and lift your hands up to shoulder height. Step back, away from the secured end of the exercise band, until the band stretches. Squeeze your shoulder blades together and pull your hands down to the sides of your thighs. Stop when your hands are straight down by your sides. This is shoulder extension. Do not let your hands go behind your body. Hold for __________ seconds. Slowly return to the starting position. Repeat __________ times. Complete this exercise __________ times a day. Rowing scapular retraction This is an exercise in which the shoulder blades (scapulae) are pulled toward each other (retraction). Sit in a stable chair without armrests, or stand up. Secure an exercise band to a stable object in front of you so the band is at shoulder height. Hold one end of the exercise band in each hand. Your palms should face toward each other. Bring your arms out straight in front of you. Step back, away from the secured end of the  exercise band, until the band stretches. Pull the band backward. As you do this, bend your elbows and squeeze your shoulder blades together, but avoid letting the rest of your body move. Do not shrug your shoulders upward while you do this. Stop when your elbows are at your sides or slightly behind your body. Hold for __________ seconds. Slowly straighten your arms to return to the starting position. Repeat __________ times.  Complete this exercise __________ times a day. Posture and body mechanics Good posture and healthy body mechanics can help to relieve stress in your body's tissues and joints. Body mechanics refers to the movements and positions of your body while you do your daily activities. Posture is part of body mechanics. Good posture means: Your spine is in its natural S-curve position (neutral). Your shoulders are pulled back slightly. Your head is not tipped forward. Follow these guidelines to improve your posture and body mechanics in your everyday activities. Standing  When standing, keep your spine neutral and your feet about hip width apart. Keep a slight bend in your knees. Your ears, shoulders, and hips should line up with each other. When you do a task in which you lean forward while standing in one place for a long time, place one foot up on a stable object that is 2-4 inches (5-10 cm) high, such as a footstool. This helps keep your spine neutral. Sitting  When sitting, keep your spine neutral and keep your feet flat on the floor. Use a footrest if needed. Keep your thighs parallel to the floor. Avoid rounding your shoulders, and avoid tilting your head forward. When working at a desk or a computer, keep your desk at a height where your hands are slightly lower than your elbows. Slide your chair under your desk so you are close enough to maintain good posture. When working at a computer, place your monitor at a height where you are looking straight ahead and you do not have to tilt your head forward or downward to look at the screen. Resting When lying down and resting, avoid positions that are most painful for you. If you have pain with activities such as sitting, bending, stooping, or squatting (flexion-basedactivities), lie in a position in which your body does not bend very much. For example, avoid curling up on your side with your arms and knees near your chest (fetal position). If you have  pain with activities such as standing for a long time or reaching with your arms (extension-basedactivities), lie with your spine in a neutral position and bend your knees slightly. Try the following positions: Lie on your side with a pillow between your knees. Lie on your back with a pillow under your knees.  Lifting  When lifting objects, keep your feet at least shoulder width apart and tighten your abdominal muscles. Bend your knees and hips and keep your spine neutral. It is important to lift using the strength of your legs, not your back. Do not lock your knees straight out. Always ask for help to lift heavy or awkward objects. This information is not intended to replace advice given to you by your health care provider. Make sure you discuss any questions you have with your health care provider. Document Revised: 02/27/2023 Document Reviewed: 05/05/2022 Elsevier Patient Education  2024 [employer].

## 2024-10-19 NOTE — Assessment & Plan Note (Signed)
 Deteriorated Continue escitalopram  20 mg and bupropion  300 mg XL daily Will add buspirone  7.5 mg twice daily Support offered

## 2024-10-19 NOTE — Assessment & Plan Note (Signed)
 Encouraged stress reduction techniques Continue ibuprofen  and tylenol  OTC as needed

## 2024-10-19 NOTE — Progress Notes (Signed)
 "  Subjective:    Patient ID: Sonya Spencer, female    DOB: 1985-11-20, 39 y.o.   MRN: [id number]  History of Present Illness:  Patient would like to follow-up chronic conditions.  GERD: Triggered by everything.  This occurs daily.  She denies breakthrough on omeprazole .  There is no upper GI on file.  Frequent headaches: These occur daily.  Triggered by stress, lack of caffeine  or sleep.  She takes ibuprofen  and tylenol  as needed with some relief of symptoms.  She does not follow with neurology.  Anxiety and depression (moderate, recurrent): Chronic, managed on escitalopram  and bupropion .  She does feel like this has been worse lately due to general life stress and is wondering about a dose adjustment.  She is currently seeing a therapist.  She denies SI/HI.  HLD: Her last LDL was 96, triglycerides 57, 03/2024.  She is not taking any cholesterol-lowering medication at this time.  She does not consume low-fat diet.   Anemia: Her last H/H was 10.5/34, 05/2024.  She is not taking any oral iron at this time but has tried increase iron fortified foods.  She does not follow with hematology.  Chronic right shoulder pain: s/p a work injury. Managed with ibuprofen  and cyclobenzaprine . Xray from 07/2023 reviewed. She follows with orthopedics.  Insomnia: She has difficulty staying asleep. She has tried unisom  and melatonin OTC. She has never had a sleep study.   She also reports chronic thoracic back pain.  She reports this has been going on for about 18 months.  She describes the pain as dull and achy.  She reports associated numbness in the area but denies tingling or weakness of her upper extremities.  She takes ibuprofen  and cyclobenzaprine  as needed with minimal relief of symptoms.  Past Medical History:  Diagnosis Date   Abnormal glucose tolerance test (GTT) during pregnancy, antepartum 12/23/2017   - pt check BG log.   - Lifestyles referral for presumptive GDM (also due to inordinate weight  loss early in pregnancy) - will treat as GDM.  Patient has refused 3 hour gtt.     Allergy    Anemia    with pregnancy only   Anxiety    Asthma    only with pregnancy   Cholelithiasis    Complication of anesthesia    panic attack with first panic   Depression    Fetal macrosomia during pregnancy in third trimester 03/25/2015   Resolved with delivery    Frequent headaches    migraines with pregnancy   GERD (gastroesophageal reflux disease)    Gestational diabetes    Gestational diabetes mellitus (GDM) affecting third pregnancy 02/05/2018   H/O maternal third degree perineal laceration, currently pregnant    History of Papanicolaou smear of cervix 10/10/11; 08/02/14   neg, ct neg; neg ct//gc/tr neg   PONV (postoperative nausea and vomiting)    Supervision of normal pregnancy 11/09/2017   [facility] Prenatal Labs Dating  Blood type:    Genetic Screen 1 Screen:    AFP:     Quad:     NIPS: Antibody:  Anatomic US   Rubella:   Varicella:   GTT Early:               Third trimester:  RPR:    Rhogam  HBsAg:    TDaP vaccine                       Flu Shot: HIV:  Baby Food                                GBS:  Contraception  Pap: CBB    CS/VBAC   Support Person         Urinary incontinence     Current Outpatient Medications  Medication Sig Dispense Refill   albuterol  (VENTOLIN  HFA) 108 (90 Base) MCG/ACT inhaler TAKE TWO PUFFS BY MOUTH EVERY SIX HOURS AS NEEDED FOR WHEEZE (Patient taking differently: Inhale 2 puffs into the lungs every 4 (four) hours as needed for wheezing or shortness of breath. TAKE TWO PUFFS BY MOUTH EVERY SIX HOURS AS NEEDED FOR WHEEZE) 8.5 g 0   buPROPion  (WELLBUTRIN  XL) 150 MG 24 hr tablet Take 2 tablets (300 mg total) by mouth daily. 180 tablet 1   cetirizine  (ZYRTEC ) 10 MG tablet Take 1 tablet (10 mg total) by mouth every evening. 90 tablet 3   escitalopram  (LEXAPRO ) 20 MG tablet Take 1 tablet (20 mg total) by mouth daily. 90 tablet 1   omeprazole  (PRILOSEC) 20 MG capsule  Take 1 capsule (20 mg total) by mouth 2 (two) times daily before a meal. 180 capsule 1   ondansetron  (ZOFRAN ) 4 MG tablet Take 1 tablet (4 mg total) by mouth every 8 (eight) hours as needed for nausea or vomiting. 20 tablet 0   semaglutide -weight management (WEGOVY ) 1 MG/0.5ML SOAJ SQ injection Inject 1 mg into the skin once a week. 6 mL 0   sulfamethoxazole -trimethoprim  (BACTRIM  DS) 800-160 MG tablet Take 1 tablet by mouth 2 (two) times daily. 10 tablet 0   No current facility-administered medications for this visit.    Allergies  Allergen Reactions   Oxycodone  Other (See Comments)    migraines   Nickel Rash   Red Dye #40 (Allura Red) Rash    Includes Benadryl  as it has red dye in it.    Family History  Problem Relation Age of Onset   Lupus Mother    Hepatitis C Mother    Depression Mother    Diabetes type I Father    Thyroid disease Father    Lupus Sister    Heart attack Sister    CVA Sister    Lupus Maternal Grandmother    Diabetes Maternal Grandmother    Diabetes Paternal Grandfather        type 1   Diabetes type I Daughter    Diabetes Daughter    Lupus Maternal Aunt    Diabetes Paternal Aunt        type 1 - runs in father's side    Social History   Socioeconomic History   Marital status: Legally Separated    Spouse name: Not on file   Number of children: 3   Years of education: 14   Highest education level: Some college, no degree  Occupational History   Occupation: HOMEMAKER  Tobacco Use   Smoking status: Former    Current packs/day: 0.00    Average packs/day: 0.3 packs/day for 2.0 years (0.5 ttl pk-yrs)    Types: Cigarettes    Start date: 03/14/2007    Quit date: 03/13/2009    Years since quitting: 15.6   Smokeless tobacco: Never  Vaping Use   Vaping status: Every Day   Substances: THC, CBD  Substance and Sexual Activity   Alcohol use: Not Currently    Alcohol/week: 3.0 standard drinks of alcohol    Types: 3 Shots  of liquor per week   Drug use: No     Comment: THC pens   Sexual activity: Yes    Birth control/protection: Surgical    Comment: Tubal Ligation  Other Topics Concern   Not on file  Social History Narrative   Not on file   Social Drivers of Health   Tobacco Use: Medium Risk (08/01/2024)   Patient History    Smoking Tobacco Use: Former    Smokeless Tobacco Use: Never    Passive Exposure: Not on file  Financial Resource Strain: Medium Risk (04/24/2024)   Overall Financial Resource Strain (CARDIA)    Difficulty of Paying Living Expenses: Somewhat hard  Food Insecurity: Food Insecurity Present (04/24/2024)   Epic    Worried About Programme Researcher, Broadcasting/film/video in the Last Year: Sometimes true    Ran Out of Food in the Last Year: Sometimes true  Transportation Needs: No Transportation Needs (04/24/2024)   Epic    Lack of Transportation (Medical): No    Lack of Transportation (Non-Medical): No  Physical Activity: Insufficiently Active (04/24/2024)   Exercise Vital Sign    Days of Exercise per Week: 4 days    Minutes of Exercise per Session: 30 min  Stress: Stress Concern Present (04/24/2024)   [employer] of Occupational Health - Occupational Stress Questionnaire    Feeling of Stress: To some extent  Social Connections: Socially Isolated (04/24/2024)   Social Connection and Isolation Panel    Frequency of Communication with Friends and Family: Twice a week    Frequency of Social Gatherings with Friends and Family: More than three times a week    Attends Religious Services: Never    Database Administrator or Organizations: No    Attends Engineer, Structural: Not on file    Marital Status: Separated  Intimate Partner Violence: Not on file  Depression (PHQ2-9): High Risk (04/25/2024)   Depression (PHQ2-9)    PHQ-2 Score: 13  Alcohol Screen: Not on file  Housing: High Risk (04/24/2024)   Epic    Unable to Pay for Housing in the Last Year: No    Number of Times Moved in the Last Year: 1    Homeless in the Last Year:  Yes  Utilities: Not At Risk (10/19/2023)   Received from [facility] Utilities    Threatened with loss of utilities: No  Health Literacy: Not on file     Constitutional: Patient reports intermittent headaches.  Denies fever, malaise, fatigue, or abrupt weight changes.  HEENT: Denies eye pain, eye redness, ear pain, ringing in the ears, wax buildup, runny nose, nasal congestion, bloody nose, or sore throat. Respiratory: Denies difficulty breathing, shortness of breath, cough or sputum production.   Cardiovascular: Denies chest pain, chest tightness, palpitations or swelling in the hands or feet.  Gastrointestinal: Patient reports intermittent reflux.  Denies abdominal pain, bloating, constipation, diarrhea or blood in the stool.  GU: Denies urgency, frequency, pain with urination, burning sensation, blood in urine, odor or discharge. Musculoskeletal: Patient reports chronic right shoulder pain, thoracic back pain.  Denies difficulty with gait, muscle pain or joint swelling.  Skin: Denies redness, rashes, lesions or ulcercations.  Neurological: Patient reports paresthesia of upper back.  Denies dizziness, difficulty with memory, difficulty with speech or problems with balance and coordination.  Psych: Patient has a history of anxiety and depression.  Denies SI/HI.  No other specific complaints in a complete review of systems (except as listed in HPI  above).  Observations/Objective:  BP 122/72 (BP Location: Right Arm, Patient Position: Sitting, Cuff Size: Normal)   Ht 5' 2 (1.575 m)   Wt 171 lb (77.6 kg)   LMP 09/22/2024 (Approximate)   BMI 31.28 kg/m    Wt Readings from Last 3 Encounters:  06/11/24 153 lb (69.4 kg)  04/25/24 156 lb 12.8 oz (71.1 kg)  12/23/23 140 lb (63.5 kg)    General: Appears her stated age, obese, in NAD. Cardio: Normal rate and rhythm.  S1 and S2 noted.  No murmurs, rubs or gallops noted.  No JVD or BLE edema. Pulmonary/Chest: Normal  effort. No respiratory distress.  Abdomen: Normal bowel sounds. MSK: Normal internal and external rotation of the right shoulder.  Pain with palpation over the right AC joint, right subacromial bursa and anterior biceps tendon.  Positive drop can test on the right.  Strength 4/5 RUE, 5/5 LUE.  Normal flexion, extension and rotation of the spine.  No pain with palpation over the thoracic spine.  No difficulty with gait. Neurological: Alert and oriented.  Coordination normal. Psychiatric: Mood and affect normal. Behavior is normal. Judgment and thought content normal.   BMET    Component Value Date/Time   NA 138 06/11/2024 0338   NA 138 10/08/2014 1732   K 3.7 06/11/2024 0338   K 3.6 10/08/2014 1732   CL 105 06/11/2024 0338   CL 106 10/08/2014 1732   CO2 26 06/11/2024 0338   CO2 23 10/08/2014 1732   GLUCOSE 89 06/11/2024 0338   GLUCOSE 70 10/08/2014 1732   BUN 20 06/11/2024 0338   BUN 5 (L) 10/08/2014 1732   CREATININE 0.59 06/11/2024 0338   CREATININE 0.52 04/25/2024 1013   CALCIUM  8.7 (L) 06/11/2024 0338   CALCIUM  8.6 10/08/2014 1732   GFRNONAA >60 06/11/2024 0338   GFRNONAA 115 10/03/2019 1110   GFRAA >60 05/09/2020 0128   GFRAA 134 10/03/2019 1110    Lipid Panel     Component Value Date/Time   CHOL 175 04/25/2024 1013   TRIG 57 04/25/2024 1013   HDL 65 04/25/2024 1013   CHOLHDL 2.7 04/25/2024 1013   LDLCALC 96 04/25/2024 1013    CBC    Component Value Date/Time   WBC 7.0 06/11/2024 0338   RBC 4.32 06/11/2024 0338   HGB 10.5 (L) 06/11/2024 0338   HGB 11.2 04/22/2018 1054   HCT 34.0 (L) 06/11/2024 0338   HCT 33.3 (L) 04/22/2018 1054   PLT 386 06/11/2024 0338   PLT 250 04/22/2018 1054   MCV 78.7 (L) 06/11/2024 0338   MCV 88 04/22/2018 1054   MCV 88 10/08/2014 1732   MCH 24.3 (L) 06/11/2024 0338   MCHC 30.9 06/11/2024 0338   RDW 14.5 06/11/2024 0338   RDW 14.3 04/22/2018 1054   RDW 14.0 10/08/2014 1732   LYMPHSABS 2.3 09/14/2023 0832   LYMPHSABS 3.3  10/08/2014 1732   MONOABS 0.6 09/14/2023 0832   MONOABS 0.7 10/08/2014 1732   EOSABS 0.2 09/14/2023 0832   EOSABS 0.1 10/08/2014 1732   BASOSABS 0.1 09/14/2023 0832   BASOSABS 0.0 10/08/2014 1732    Hgb A1C Lab Results  Component Value Date   HGBA1C 5.4 04/25/2024       Assessment and Plan:  RTC in 6 months for your annual exam    Angeline Laura, NP  "

## 2024-10-20 ENCOUNTER — Encounter: Payer: Self-pay | Admitting: Internal Medicine

## 2024-10-20 ENCOUNTER — Ambulatory Visit: Payer: Self-pay | Admitting: Internal Medicine

## 2024-10-20 LAB — CBC
HCT: 31.9 % — ABNORMAL LOW (ref 35.9–46.0)
Hemoglobin: 9.5 g/dL — ABNORMAL LOW (ref 11.7–15.5)
MCH: 23.1 pg — ABNORMAL LOW (ref 27.0–33.0)
MCHC: 29.8 g/dL — ABNORMAL LOW (ref 31.6–35.4)
MCV: 77.6 fL — ABNORMAL LOW (ref 81.4–101.7)
MPV: 10.7 fL (ref 7.5–12.5)
Platelets: 361 Thousand/uL (ref 140–400)
RBC: 4.11 Million/uL (ref 3.80–5.10)
RDW: 14.2 % (ref 11.0–15.0)
WBC: 12.4 Thousand/uL — ABNORMAL HIGH (ref 3.8–10.8)

## 2024-10-20 LAB — COMPREHENSIVE METABOLIC PANEL WITH GFR
AG Ratio: 1.8 (calc) (ref 1.0–2.5)
ALT: 18 U/L (ref 6–29)
AST: 38 U/L — ABNORMAL HIGH (ref 10–30)
Albumin: 4.5 g/dL (ref 3.6–5.1)
Alkaline phosphatase (APISO): 60 U/L (ref 31–125)
BUN: 14 mg/dL (ref 7–25)
CO2: 28 mmol/L (ref 20–32)
Calcium: 9.2 mg/dL (ref 8.6–10.2)
Chloride: 103 mmol/L (ref 98–110)
Creat: 0.67 mg/dL (ref 0.50–0.97)
Globulin: 2.5 g/dL (ref 1.9–3.7)
Glucose, Bld: 63 mg/dL — ABNORMAL LOW (ref 65–139)
Potassium: 4.5 mmol/L (ref 3.5–5.3)
Sodium: 139 mmol/L (ref 135–146)
Total Bilirubin: 0.3 mg/dL (ref 0.2–1.2)
Total Protein: 7 g/dL (ref 6.1–8.1)
eGFR: 116 mL/min/1.73m2

## 2024-10-20 LAB — IRON,TIBC AND FERRITIN PANEL
%SAT: 5 % — ABNORMAL LOW (ref 16–45)
Ferritin: 4 ng/mL — ABNORMAL LOW (ref 16–154)
Iron: 22 ug/dL — ABNORMAL LOW (ref 40–190)
TIBC: 445 ug/dL (ref 250–450)

## 2024-10-20 LAB — HEMOGLOBIN A1C
Hgb A1c MFr Bld: 5.3 %
Mean Plasma Glucose: 105 mg/dL
eAG (mmol/L): 5.8 mmol/L

## 2024-10-20 LAB — LIPID PANEL
Cholesterol: 223 mg/dL — ABNORMAL HIGH
HDL: 70 mg/dL
LDL Cholesterol (Calc): 133 mg/dL — ABNORMAL HIGH
Non-HDL Cholesterol (Calc): 153 mg/dL — ABNORMAL HIGH
Total CHOL/HDL Ratio: 3.2 (calc)
Triglycerides: 97 mg/dL

## 2024-10-21 MED ORDER — ZEPBOUND 2.5 MG/0.5ML ~~LOC~~ SOAJ: 2.5000 mg | SUBCUTANEOUS | 0 refills | Status: AC

## 2024-10-25 ENCOUNTER — Other Ambulatory Visit (HOSPITAL_COMMUNITY): Payer: Self-pay

## 2024-10-25 ENCOUNTER — Ambulatory Visit: Admitting: Internal Medicine

## 2025-04-26 ENCOUNTER — Encounter: Admitting: Internal Medicine
